# Patient Record
Sex: Female | Born: 1975 | Race: Black or African American | Hispanic: No | Marital: Single | Smoking: Current every day smoker
Health system: Southern US, Community
[De-identification: ages and names within clinical notes are randomized; demographics above are authoritative.]

## PROBLEM LIST (undated history)

## (undated) DIAGNOSIS — J449 Chronic obstructive pulmonary disease, unspecified: Secondary | ICD-10-CM

## (undated) DIAGNOSIS — G4730 Sleep apnea, unspecified: Secondary | ICD-10-CM

## (undated) DIAGNOSIS — Z9889 Other specified postprocedural states: Secondary | ICD-10-CM

## (undated) DIAGNOSIS — M545 Low back pain, unspecified: Secondary | ICD-10-CM

## (undated) DIAGNOSIS — R29898 Other symptoms and signs involving the musculoskeletal system: Secondary | ICD-10-CM

## (undated) DIAGNOSIS — J439 Emphysema, unspecified: Secondary | ICD-10-CM

## (undated) DIAGNOSIS — R112 Nausea with vomiting, unspecified: Secondary | ICD-10-CM

## (undated) DIAGNOSIS — M51369 Other intervertebral disc degeneration, lumbar region without mention of lumbar back pain or lower extremity pain: Secondary | ICD-10-CM

## (undated) DIAGNOSIS — F32A Depression, unspecified: Secondary | ICD-10-CM

## (undated) DIAGNOSIS — J189 Pneumonia, unspecified organism: Secondary | ICD-10-CM

## (undated) DIAGNOSIS — D509 Iron deficiency anemia, unspecified: Principal | ICD-10-CM

## (undated) DIAGNOSIS — I10 Essential (primary) hypertension: Secondary | ICD-10-CM

## (undated) DIAGNOSIS — A64 Unspecified sexually transmitted disease: Secondary | ICD-10-CM

## (undated) DIAGNOSIS — F419 Anxiety disorder, unspecified: Secondary | ICD-10-CM

## (undated) DIAGNOSIS — M5136 Other intervertebral disc degeneration, lumbar region: Secondary | ICD-10-CM

## (undated) HISTORY — DX: Low back pain, unspecified: M54.50

## (undated) HISTORY — DX: Depression, unspecified: F32.A

## (undated) HISTORY — DX: Iron deficiency anemia, unspecified: D50.9

## (undated) HISTORY — PX: TUBAL LIGATION: SHX77

## (undated) HISTORY — DX: Unspecified sexually transmitted disease: A64

## (undated) HISTORY — DX: Sleep apnea, unspecified: G47.30

## (undated) HISTORY — PX: OTHER SURGICAL HISTORY: SHX169

## (undated) HISTORY — DX: Anxiety disorder, unspecified: F41.9

## (undated) HISTORY — DX: Other symptoms and signs involving the musculoskeletal system: R29.898

## (undated) HISTORY — DX: Other intervertebral disc degeneration, lumbar region without mention of lumbar back pain or lower extremity pain: M51.369

## (undated) HISTORY — DX: Other intervertebral disc degeneration, lumbar region: M51.36

---

## 1997-04-26 ENCOUNTER — Emergency Department (HOSPITAL_COMMUNITY): Admission: EM | Admit: 1997-04-26 | Discharge: 1997-04-26 | Payer: Self-pay | Admitting: Emergency Medicine

## 1997-05-21 ENCOUNTER — Emergency Department (HOSPITAL_COMMUNITY): Admission: EM | Admit: 1997-05-21 | Discharge: 1997-05-21 | Payer: Self-pay | Admitting: Emergency Medicine

## 1997-06-10 ENCOUNTER — Emergency Department (HOSPITAL_COMMUNITY): Admission: EM | Admit: 1997-06-10 | Discharge: 1997-06-10 | Payer: Self-pay | Admitting: Emergency Medicine

## 1997-06-15 ENCOUNTER — Inpatient Hospital Stay (HOSPITAL_COMMUNITY): Admission: AD | Admit: 1997-06-15 | Discharge: 1997-06-15 | Payer: Self-pay | Admitting: *Deleted

## 1997-06-28 ENCOUNTER — Inpatient Hospital Stay (HOSPITAL_COMMUNITY): Admission: AD | Admit: 1997-06-28 | Discharge: 1997-06-28 | Payer: Self-pay | Admitting: Obstetrics

## 1997-07-05 ENCOUNTER — Emergency Department (HOSPITAL_COMMUNITY): Admission: EM | Admit: 1997-07-05 | Discharge: 1997-07-05 | Payer: Self-pay | Admitting: Emergency Medicine

## 1997-07-13 ENCOUNTER — Inpatient Hospital Stay (HOSPITAL_COMMUNITY): Admission: AD | Admit: 1997-07-13 | Discharge: 1997-07-13 | Payer: Self-pay | Admitting: Obstetrics

## 1997-07-29 ENCOUNTER — Inpatient Hospital Stay (HOSPITAL_COMMUNITY): Admission: AD | Admit: 1997-07-29 | Discharge: 1997-07-29 | Payer: Self-pay | Admitting: Obstetrics

## 1997-08-01 ENCOUNTER — Encounter: Admission: RE | Admit: 1997-08-01 | Discharge: 1997-08-01 | Payer: Self-pay | Admitting: Obstetrics

## 1997-08-14 ENCOUNTER — Emergency Department (HOSPITAL_COMMUNITY): Admission: EM | Admit: 1997-08-14 | Discharge: 1997-08-14 | Payer: Self-pay | Admitting: Emergency Medicine

## 1998-02-01 ENCOUNTER — Emergency Department (HOSPITAL_COMMUNITY): Admission: EM | Admit: 1998-02-01 | Discharge: 1998-02-01 | Payer: Self-pay | Admitting: Emergency Medicine

## 1998-05-06 ENCOUNTER — Encounter: Payer: Self-pay | Admitting: Emergency Medicine

## 1998-05-06 ENCOUNTER — Emergency Department (HOSPITAL_COMMUNITY): Admission: EM | Admit: 1998-05-06 | Discharge: 1998-05-06 | Payer: Self-pay | Admitting: Emergency Medicine

## 1998-05-08 ENCOUNTER — Emergency Department (HOSPITAL_COMMUNITY): Admission: EM | Admit: 1998-05-08 | Discharge: 1998-05-08 | Payer: Self-pay | Admitting: Emergency Medicine

## 1998-05-08 ENCOUNTER — Encounter: Payer: Self-pay | Admitting: Emergency Medicine

## 1998-07-17 ENCOUNTER — Encounter: Payer: Self-pay | Admitting: *Deleted

## 1998-07-17 ENCOUNTER — Inpatient Hospital Stay (HOSPITAL_COMMUNITY): Admission: EM | Admit: 1998-07-17 | Discharge: 1998-07-23 | Payer: Self-pay | Admitting: Emergency Medicine

## 1998-07-19 ENCOUNTER — Encounter: Payer: Self-pay | Admitting: *Deleted

## 1998-08-26 ENCOUNTER — Encounter: Payer: Self-pay | Admitting: *Deleted

## 1998-08-26 ENCOUNTER — Emergency Department (HOSPITAL_COMMUNITY): Admission: EM | Admit: 1998-08-26 | Discharge: 1998-08-26 | Payer: Self-pay | Admitting: Emergency Medicine

## 1999-02-27 ENCOUNTER — Inpatient Hospital Stay (HOSPITAL_COMMUNITY): Admission: AD | Admit: 1999-02-27 | Discharge: 1999-02-27 | Payer: Self-pay | Admitting: Obstetrics

## 1999-03-05 ENCOUNTER — Inpatient Hospital Stay (HOSPITAL_COMMUNITY): Admission: AD | Admit: 1999-03-05 | Discharge: 1999-03-05 | Payer: Self-pay | Admitting: Obstetrics & Gynecology

## 2000-03-14 ENCOUNTER — Emergency Department (HOSPITAL_COMMUNITY): Admission: EM | Admit: 2000-03-14 | Discharge: 2000-03-14 | Payer: Self-pay | Admitting: Emergency Medicine

## 2000-12-18 ENCOUNTER — Encounter: Payer: Self-pay | Admitting: Emergency Medicine

## 2000-12-19 ENCOUNTER — Inpatient Hospital Stay (HOSPITAL_COMMUNITY): Admission: EM | Admit: 2000-12-19 | Discharge: 2000-12-28 | Payer: Self-pay | Admitting: Emergency Medicine

## 2000-12-28 ENCOUNTER — Encounter: Payer: Self-pay | Admitting: Emergency Medicine

## 2000-12-29 ENCOUNTER — Inpatient Hospital Stay (HOSPITAL_COMMUNITY): Admission: EM | Admit: 2000-12-29 | Discharge: 2000-12-31 | Payer: Self-pay | Admitting: Emergency Medicine

## 2000-12-30 ENCOUNTER — Encounter: Payer: Self-pay | Admitting: Internal Medicine

## 2001-01-18 ENCOUNTER — Inpatient Hospital Stay (HOSPITAL_COMMUNITY): Admission: EM | Admit: 2001-01-18 | Discharge: 2001-01-24 | Payer: Self-pay | Admitting: Emergency Medicine

## 2001-01-18 ENCOUNTER — Encounter: Payer: Self-pay | Admitting: Emergency Medicine

## 2001-07-11 ENCOUNTER — Encounter: Payer: Self-pay | Admitting: Emergency Medicine

## 2001-07-11 ENCOUNTER — Inpatient Hospital Stay (HOSPITAL_COMMUNITY): Admission: EM | Admit: 2001-07-11 | Discharge: 2001-07-20 | Payer: Self-pay | Admitting: Emergency Medicine

## 2001-07-12 ENCOUNTER — Encounter: Payer: Self-pay | Admitting: Internal Medicine

## 2001-07-14 ENCOUNTER — Encounter: Payer: Self-pay | Admitting: Internal Medicine

## 2001-07-16 ENCOUNTER — Encounter: Payer: Self-pay | Admitting: Internal Medicine

## 2001-07-17 ENCOUNTER — Encounter: Payer: Self-pay | Admitting: Internal Medicine

## 2001-07-21 ENCOUNTER — Emergency Department (HOSPITAL_COMMUNITY): Admission: EM | Admit: 2001-07-21 | Discharge: 2001-07-21 | Payer: Self-pay | Admitting: Emergency Medicine

## 2001-07-21 ENCOUNTER — Encounter: Payer: Self-pay | Admitting: Emergency Medicine

## 2001-11-08 ENCOUNTER — Emergency Department (HOSPITAL_COMMUNITY): Admission: EM | Admit: 2001-11-08 | Discharge: 2001-11-08 | Payer: Self-pay | Admitting: Emergency Medicine

## 2001-12-14 ENCOUNTER — Encounter: Payer: Self-pay | Admitting: Emergency Medicine

## 2001-12-14 ENCOUNTER — Emergency Department (HOSPITAL_COMMUNITY): Admission: EM | Admit: 2001-12-14 | Discharge: 2001-12-14 | Payer: Self-pay | Admitting: Emergency Medicine

## 2002-04-02 ENCOUNTER — Inpatient Hospital Stay (HOSPITAL_COMMUNITY): Admission: EM | Admit: 2002-04-02 | Discharge: 2002-04-05 | Payer: Self-pay | Admitting: Emergency Medicine

## 2002-04-02 ENCOUNTER — Encounter: Payer: Self-pay | Admitting: Emergency Medicine

## 2002-08-20 ENCOUNTER — Emergency Department (HOSPITAL_COMMUNITY): Admission: EM | Admit: 2002-08-20 | Discharge: 2002-08-20 | Payer: Self-pay | Admitting: *Deleted

## 2002-08-20 ENCOUNTER — Encounter: Payer: Self-pay | Admitting: Emergency Medicine

## 2002-09-12 ENCOUNTER — Emergency Department (HOSPITAL_COMMUNITY): Admission: EM | Admit: 2002-09-12 | Discharge: 2002-09-12 | Payer: Self-pay | Admitting: Emergency Medicine

## 2002-10-20 ENCOUNTER — Emergency Department (HOSPITAL_COMMUNITY): Admission: EM | Admit: 2002-10-20 | Discharge: 2002-10-20 | Payer: Self-pay | Admitting: Emergency Medicine

## 2002-10-31 ENCOUNTER — Emergency Department (HOSPITAL_COMMUNITY): Admission: EM | Admit: 2002-10-31 | Discharge: 2002-10-31 | Payer: Self-pay | Admitting: Emergency Medicine

## 2002-12-03 ENCOUNTER — Emergency Department (HOSPITAL_COMMUNITY): Admission: EM | Admit: 2002-12-03 | Discharge: 2002-12-03 | Payer: Self-pay | Admitting: Emergency Medicine

## 2003-01-29 ENCOUNTER — Emergency Department (HOSPITAL_COMMUNITY): Admission: EM | Admit: 2003-01-29 | Discharge: 2003-01-29 | Payer: Self-pay | Admitting: Emergency Medicine

## 2003-07-30 ENCOUNTER — Emergency Department (HOSPITAL_COMMUNITY): Admission: EM | Admit: 2003-07-30 | Discharge: 2003-07-30 | Payer: Self-pay | Admitting: Emergency Medicine

## 2003-08-01 ENCOUNTER — Emergency Department (HOSPITAL_COMMUNITY): Admission: EM | Admit: 2003-08-01 | Discharge: 2003-08-01 | Payer: Self-pay | Admitting: Emergency Medicine

## 2004-01-07 ENCOUNTER — Inpatient Hospital Stay (HOSPITAL_COMMUNITY): Admission: AD | Admit: 2004-01-07 | Discharge: 2004-01-07 | Payer: Self-pay | Admitting: Obstetrics & Gynecology

## 2004-02-14 ENCOUNTER — Inpatient Hospital Stay (HOSPITAL_COMMUNITY): Admission: AD | Admit: 2004-02-14 | Discharge: 2004-02-14 | Payer: Self-pay | Admitting: Family Medicine

## 2004-02-26 ENCOUNTER — Inpatient Hospital Stay (HOSPITAL_COMMUNITY): Admission: AD | Admit: 2004-02-26 | Discharge: 2004-02-26 | Payer: Self-pay | Admitting: Obstetrics

## 2004-03-29 ENCOUNTER — Observation Stay (HOSPITAL_COMMUNITY): Admission: AD | Admit: 2004-03-29 | Discharge: 2004-03-29 | Payer: Self-pay | Admitting: Obstetrics & Gynecology

## 2004-05-19 ENCOUNTER — Inpatient Hospital Stay (HOSPITAL_COMMUNITY): Admission: AD | Admit: 2004-05-19 | Discharge: 2004-05-22 | Payer: Self-pay | Admitting: *Deleted

## 2004-07-08 ENCOUNTER — Observation Stay (HOSPITAL_COMMUNITY): Admission: EM | Admit: 2004-07-08 | Discharge: 2004-07-09 | Payer: Self-pay | Admitting: Obstetrics & Gynecology

## 2004-07-08 ENCOUNTER — Encounter: Payer: Self-pay | Admitting: Emergency Medicine

## 2004-07-24 ENCOUNTER — Inpatient Hospital Stay (HOSPITAL_COMMUNITY): Admission: AD | Admit: 2004-07-24 | Discharge: 2004-07-24 | Payer: Self-pay | Admitting: Obstetrics & Gynecology

## 2004-07-25 ENCOUNTER — Inpatient Hospital Stay (HOSPITAL_COMMUNITY): Admission: AD | Admit: 2004-07-25 | Discharge: 2004-07-28 | Payer: Self-pay | Admitting: Obstetrics & Gynecology

## 2004-08-12 ENCOUNTER — Inpatient Hospital Stay (HOSPITAL_COMMUNITY): Admission: AD | Admit: 2004-08-12 | Discharge: 2004-08-15 | Payer: Self-pay | Admitting: Obstetrics & Gynecology

## 2004-08-12 ENCOUNTER — Encounter (INDEPENDENT_AMBULATORY_CARE_PROVIDER_SITE_OTHER): Payer: Self-pay | Admitting: Specialist

## 2005-03-24 ENCOUNTER — Emergency Department (HOSPITAL_COMMUNITY): Admission: EM | Admit: 2005-03-24 | Discharge: 2005-03-24 | Payer: Self-pay | Admitting: Emergency Medicine

## 2005-09-25 ENCOUNTER — Emergency Department (HOSPITAL_COMMUNITY): Admission: EM | Admit: 2005-09-25 | Discharge: 2005-09-25 | Payer: Self-pay | Admitting: Emergency Medicine

## 2006-06-11 ENCOUNTER — Emergency Department (HOSPITAL_COMMUNITY): Admission: EM | Admit: 2006-06-11 | Discharge: 2006-06-11 | Payer: Self-pay | Admitting: Emergency Medicine

## 2006-06-30 ENCOUNTER — Inpatient Hospital Stay (HOSPITAL_COMMUNITY): Admission: AD | Admit: 2006-06-30 | Discharge: 2006-06-30 | Payer: Self-pay | Admitting: Obstetrics and Gynecology

## 2006-07-03 ENCOUNTER — Inpatient Hospital Stay (HOSPITAL_COMMUNITY): Admission: AD | Admit: 2006-07-03 | Discharge: 2006-07-03 | Payer: Self-pay | Admitting: Gynecology

## 2006-09-11 ENCOUNTER — Encounter: Admission: RE | Admit: 2006-09-11 | Discharge: 2006-09-11 | Payer: Self-pay | Admitting: Orthopedic Surgery

## 2008-12-22 ENCOUNTER — Inpatient Hospital Stay (HOSPITAL_COMMUNITY): Admission: AD | Admit: 2008-12-22 | Discharge: 2008-12-22 | Payer: Self-pay | Admitting: Obstetrics

## 2008-12-25 ENCOUNTER — Emergency Department (HOSPITAL_COMMUNITY): Admission: EM | Admit: 2008-12-25 | Discharge: 2008-12-25 | Payer: Self-pay | Admitting: Emergency Medicine

## 2009-02-15 ENCOUNTER — Emergency Department (HOSPITAL_COMMUNITY): Admission: EM | Admit: 2009-02-15 | Discharge: 2009-02-15 | Payer: Self-pay | Admitting: Emergency Medicine

## 2009-09-24 ENCOUNTER — Ambulatory Visit (HOSPITAL_COMMUNITY): Admission: RE | Admit: 2009-09-24 | Discharge: 2009-09-24 | Payer: Self-pay | Admitting: Pulmonary Disease

## 2010-01-24 ENCOUNTER — Encounter: Payer: Self-pay | Admitting: Obstetrics & Gynecology

## 2010-01-25 ENCOUNTER — Encounter: Payer: Self-pay | Admitting: Orthopedic Surgery

## 2010-01-25 ENCOUNTER — Encounter: Payer: Self-pay | Admitting: Obstetrics & Gynecology

## 2010-01-25 ENCOUNTER — Encounter: Payer: Self-pay | Admitting: Obstetrics

## 2010-01-25 ENCOUNTER — Encounter: Payer: Self-pay | Admitting: Pulmonary Disease

## 2010-01-26 ENCOUNTER — Encounter: Payer: Self-pay | Admitting: Orthopedic Surgery

## 2010-03-25 LAB — SALICYLATE LEVEL: Salicylate Lvl: <4 mg/dL (ref 2.8–20.0)

## 2010-03-25 LAB — CBC
HCT: 34.2 % — ABNORMAL LOW (ref 36.0–46.0)
MCV: 77.4 fL — ABNORMAL LOW (ref 78.0–100.0)
RBC: 4.42 MIL/uL (ref 3.87–5.11)
WBC: 13.3 10*3/uL — ABNORMAL HIGH (ref 4.0–10.5)

## 2010-03-25 LAB — DIFFERENTIAL
Eosinophils Absolute: 0 10*3/uL (ref 0.0–0.7)
Lymphocytes Relative: 10 % — ABNORMAL LOW (ref 12–46)
Lymphs Abs: 1.4 10*3/uL (ref 0.7–4.0)
Monocytes Relative: 6 % (ref 3–12)
Neutrophils Relative %: 84 % — ABNORMAL HIGH (ref 43–77)

## 2010-03-25 LAB — ACETAMINOPHEN LEVEL

## 2010-03-25 LAB — BASIC METABOLIC PANEL
BUN: 11 mg/dL (ref 6–23)
CO2: 21 mEq/L (ref 19–32)
GFR calc non Af Amer: 56 mL/min — ABNORMAL LOW (ref >60)
Glucose, Bld: 162 mg/dL — ABNORMAL HIGH (ref 70–99)
Potassium: 3.8 mEq/L (ref 3.5–5.1)
Sodium: 134 mEq/L — ABNORMAL LOW (ref 135–145)

## 2010-04-06 LAB — CBC
HCT: 35.4 % — ABNORMAL LOW (ref 36.0–46.0)
Hemoglobin: 11.3 g/dL — ABNORMAL LOW (ref 12.0–15.0)
MCHC: 31.8 g/dL (ref 30.0–36.0)
MCHC: 31.5 g/dL (ref 30.0–36.0)
MCV: 79.5 fL (ref 78.0–100.0)
MCV: 79.8 fL (ref 78.0–100.0)
RBC: 4.46 MIL/uL (ref 3.87–5.11)
RBC: 4.39 MIL/uL (ref 3.87–5.11)
RDW: 19.1 % — ABNORMAL HIGH (ref 11.5–15.5)
WBC: 11.9 10*3/uL — ABNORMAL HIGH (ref 4.0–10.5)

## 2010-04-06 LAB — DIFFERENTIAL
Basophils Relative: 0 % (ref 0–1)
Eosinophils Absolute: 0.4 10*3/uL (ref 0.0–0.7)
Eosinophils Relative: 3 % (ref 0–5)
Lymphocytes Relative: 8 % — ABNORMAL LOW (ref 12–46)
Lymphs Abs: 2.1 10*3/uL (ref 0.7–4.0)
Lymphs Abs: 1.1 10*3/uL (ref 0.7–4.0)
Monocytes Absolute: 0.7 10*3/uL (ref 0.1–1.0)
Monocytes Relative: 6 % (ref 3–12)
Monocytes Relative: 2 % — ABNORMAL LOW (ref 3–12)
Neutrophils Relative %: 73 % (ref 43–77)
Neutrophils Relative %: 89 % — ABNORMAL HIGH (ref 43–77)

## 2010-04-06 LAB — URINALYSIS, ROUTINE W REFLEX MICROSCOPIC
Bilirubin Urine: NEGATIVE
Bilirubin Urine: NEGATIVE
Ketones, ur: 15 mg/dL — AB
Nitrite: NEGATIVE
Nitrite: NEGATIVE
Protein, ur: NEGATIVE mg/dL
Specific Gravity, Urine: 1.012 (ref 1.005–1.030)
Urobilinogen, UA: 0.2 mg/dL (ref 0.0–1.0)
Urobilinogen, UA: 0.2 mg/dL (ref 0.0–1.0)
pH: 6 (ref 5.0–8.0)

## 2010-04-06 LAB — COMPREHENSIVE METABOLIC PANEL
ALT: 15 U/L (ref 0–35)
AST: 22 U/L (ref 0–37)
Albumin: 3.6 g/dL (ref 3.5–5.2)
Alkaline Phosphatase: 37 U/L — ABNORMAL LOW (ref 39–117)
CO2: 25 mEq/L (ref 19–32)
Calcium: 9.7 mg/dL (ref 8.4–10.5)
Chloride: 105 mEq/L (ref 96–112)
Creatinine, Ser: 0.86 mg/dL (ref 0.4–1.2)
GFR calc Af Amer: >60 mL/min (ref >60)
GFR calc non Af Amer: >60 mL/min (ref >60)
Glucose, Bld: 105 mg/dL — ABNORMAL HIGH (ref 70–99)
Glucose, Bld: 165 mg/dL — ABNORMAL HIGH (ref 70–99)
Potassium: 3.9 mEq/L (ref 3.5–5.1)
Sodium: 136 mEq/L (ref 135–145)
Sodium: 136 mEq/L (ref 135–145)
Total Bilirubin: 0.5 mg/dL (ref 0.3–1.2)
Total Protein: 6.9 g/dL (ref 6.0–8.3)
Total Protein: 7.6 g/dL (ref 6.0–8.3)

## 2010-04-06 LAB — GC/CHLAMYDIA PROBE AMP, GENITAL

## 2010-04-06 LAB — RAPID URINE DRUG SCREEN, HOSP PERFORMED
Amphetamines: NOT DETECTED
Benzodiazepines: POSITIVE — AB
Cocaine: POSITIVE — AB
Opiates: NOT DETECTED
Tetrahydrocannabinol: POSITIVE — AB

## 2010-04-06 LAB — HCG, SERUM, QUALITATIVE: Preg, Serum: NEGATIVE

## 2010-04-06 LAB — TYPE AND SCREEN
ABO/RH(D): O POS
Antibody Screen: NEGATIVE

## 2010-04-06 LAB — ABO/RH: ABO/RH(D): O POS

## 2010-04-06 LAB — WET PREP, GENITAL

## 2010-05-22 NOTE — Discharge Summary (Signed)
Andrea Montgomery, Andrea Montgomery               ACCOUNT NO.:  [id number]   MEDICAL RECORD NO.:  [id number]          PATIENT TYPE:  INP   LOCATION:  9104                          FACILITY:  [facility]   PHYSICIAN:  Roseanna Rainbow, M.D.DATE OF BIRTH:  Jun 19, 1975   DATE OF ADMISSION:  08/12/2004  DATE OF DISCHARGE:  08/15/2004                                 DISCHARGE SUMMARY   CHIEF COMPLAINT:  The patient is a 35 year old, gravida 2, para 0-1-0-1, who  presented at 35+ weeks with fetal heart rate decelerations during non-stress  test.   HISTORY OF PRESENT ILLNESS:  Please see the above.  The nadir of the  decelerations was 70 beats per minute for five minutes.  Prenatal course--  care at [facility] with onset of care at 11 weeks.  Pregnancy  complications or risks:  Pregestational diabetes mellitus, hyperemesis  gravidarum, tobacco use.   MEDICATIONS:  Prenatal vitamins.   ALLERGIES:  No known allergies.   OB HISTORY:  In 1998 at 34 weeks, possibly although the dating is  questionable, she was delivered of an infant, 9 pounds 14 ounces by cesarean  delivery.  That pregnancy was complicated by large-for-gestational-age  infant and pregestational diabetes mellitus.   PAST MEDICAL HISTORY:  Asthma.  Please see above.   PAST SURGICAL HISTORY:  See the above.   FAMILY AND SOCIAL HISTORY:  She is a Consulting civil engineer.  Please see the above.   PRENATAL LABS:  Type and RH:  O positive, antibody screen negative.  Hemoglobin 11.7, platelets 401,000.  Hepatitis B surface antigen  nonreactive. Syphilis nonreactive.   PHYSICAL EXAMINATION:  VITAL SIGNS:  Stable. Afebrile.  Random CBG 100.  GENERAL:  No apparent distress.  ABDOMEN:  Gravid.  Fetal heart rate baseline 140's, nonreactive,  variability average.  Please see the above.  Contractions:  None.   ASSESSMENT:  1.  Intrauterine pregnancy at 35+ weeks with pregestational diabetes      mellitus.  2.  Suspicious fetal heart tracing.  3.   History of previous cesarean delivery.   PLAN:  Admission.  Repeat cesarean delivery.   HOSPITAL COURSE:  The patient was admitted and underwent a repeat cesarean  delivery and bilateral tubal ligation.  Please see the dictated operative  summary for further details.  Her postoperative course was uneventful.  She  was discharged to home on postoperative day #3, tolerating an ADA diet.   DISCHARGE DIAGNOSES:  1.  Intrauterine pregnancy at 35+ weeks.  2.  Suspicious fetal heart tracing with prolonged decelerations.  3.  Pregestational diabetes mellitus.   OPERATION/PROCEDURE:  1.  Repeat cesarean delivery.  2.  Bilateral tubal ligation.   CONDITION ON DISCHARGE:  Stable.   DIET:  ADA diet.   DISCHARGE MEDICATIONS:  Tylox, ibuprofen, prenatal vitamins, stool softener.   DISPOSITION:  The patient is to follow up in the office in six weeks.      Roseanna Rainbow, M.D.  Electronically Signed     LAJ/MEDQ  D:  11/18/2004  T:  11/19/2004  Job:  [id number]

## 2010-05-22 NOTE — Discharge Summary (Signed)
[facility]  Patient:    Andrea Montgomery, Andrea Montgomery Visit Number: [id number] MRN: [id number]          Service Type: EMS Location: ED Attending Physician:  Donnetta Hutching Dictated by:   Gracelyn Nurse, M.D. Admit Date:  07/21/2001 Discharge Date: 07/21/2001   CC:         Eino Farber., M.D.   Discharge Summary  DISCHARGE DIAGNOSES: 1. Pneumonia. 2. Asthma exacerbation. 3. Type 2 diabetes. 4. Hypertension. 5. Depression. 6. Tobacco abuse. 7. Morbid obesity. 8. Oral and vaginal candidiasis, likely secondary to antibiotics.  DISCHARGE MEDICATIONS: 1. Tequin 400 mg q.d. x3 more days. 2. Diflucan 10 mg q.d. x3 days. 3. Albuterol MDI 2 puffs b.i.d. 4. Advair 100/50 1 puff b.i.d. 5. Glucotrol 10 mg b.i.d. 6. Glucophage 500 mg t.i.d.  ADMISSION HISTORY AND PHYSICAL: This is a 35 year old, morbidly obese black female with a two-week history of progressive shortness of breath, wheezing and cough productive of yellow sputum. She has been getting less and less relief from her inhalers.  HOSPITAL COURSE: #1 - PNEUMONIA: Chest x-ray showed a left lower lobe infiltrate. The patient was started on IV Tequin, responded to treatment and was changed over to p.o. and she is to finish off a 10-day course of Tequin as an outpatient.  #2 - ASTHMA EXACERBATION: The patient was wheezing on admission. This is likely secondary to the pneumonia aggravating her asthma. She was treated with p.o. steroids and nebulizers, responded appropriately. Steroids were tapered off pretty quickly and she will not be taking any as an outpatient.  #3 - TYPE 2 DIABETES: This has been poorly controlled as an outpatient due to questionable compliance. She was maintained on sliding-scale insulin and her home medications. Sliding-scale is being used because she was on steroids. Those were tapered down. Blood sugar control got better and she is not going to be discharged on any  steroids so her diabetes control does need to be reassessed as an outpatient.  DISPOSITION: The patient is to be discharged in stable condition. She is to contact Dr. Junius Roads office next week for a followup appointment. Dictated by:   Gracelyn Nurse, M.D. Attending Physician:  Donnetta Hutching DD:  07/20/01 TD:  07/26/01 Job: [id number]

## 2010-05-22 NOTE — Op Note (Signed)
Andrea Montgomery, Andrea Montgomery               ACCOUNT NO.:  [id number]   MEDICAL RECORD NO.:  [id number]          PATIENT TYPE:  INP   LOCATION:  9104                          [facility]ACILITY:  [facility]   PHYSICIAN:  Roseanna Rainbow, M.D.DATE OF BIRTH:  12-Nov-1975   DATE OF PROCEDURE:  08/12/2004  DATE OF DISCHARGE:                                 OPERATIVE REPORT   PREOPERATIVE DIAGNOSES:  1.  Intrauterine pregnancy at 35 plus weeks.  2.  Nonstress test suspicious with a prolonged severe deceleration for five      minutes.  3.  Pregestational diabetes mellitus.  4.  History of a previous cesarean delivery.  5.  Desires a sterilization procedure.   POSTOPERATIVE DIAGNOSES:  1.  Intrauterine pregnancy at 35 plus weeks.  2.  Nonstress test suspicious with a prolonged severe deceleration for five      minutes.  3.  Pregestational diabetes mellitus.  4.  History of a previous cesarean delivery.  5.  Desires a sterilization procedure.  6.  Adnexal adhesions.   PROCEDURE:  Repeat cesarean delivery, modified Pomeroy, bilateral tubal  ligation, lysis of adhesions via Pfannenstiel skin incision.   SURGEON:  Dr. Tamela Oddi, Dr. Clearance Coots.   ANESTHESIA:  Spinal.   ESTIMATED BLOOD LOSS:  600 cc.   COMPLICATIONS:  None.   IV FLUIDS AND URINE OUTPUT:  As per anesthesiology.   FINDINGS:  Bilaterally there were adhesions involving the ovary and the tube  at the mid isthmic and ampullary portions.. The infant was double footling  breech presentation.   PROCEDURE:  The patient was taken to the operating room with an IV running.  She was placed in the dorsal supine position with a leftward tilt and  prepped and draped in the usual sterile fashion. A Pfannenstiel skin  incision was then made with the scalpel and carried down to the underlying  fascia with the Bovie. The fascia was nicked in the midline. The fascial  incision was then extended bilaterally with curved Mayo scissors. The  superior aspect  of the fascial incision was then tented up and the  underlying rectus muscles dissected off. The inferior aspect of the fascial  incision was manipulated in a similar fashion. The rectus muscles were  separated in the midline. The parietoperitoneum was tented up and entered  sharply with Metzenbaum scissors. This incision was then extended superiorly  and inferiorly with good visualization of the bladder. The bladder blade was  then placed. The vesicouterine peritoneum was tented up and entered sharply  with Metzenbaum scissors. This incision was then extended bilaterally and  the bladder flap created sharply. The bladder blade was then replaced. The  lower uterine segment was incised in a transverse fashion with the scalpel.  This incision was then extended bluntly. A complete breech extraction was  then performed. The infant's head was delivered atraumatically. The  oropharynx was suctioned with the bulb suction. The cord was clamped and  cut. The infant was handed off to the waiting neonatologist. Cord gas was  sent. The Apgars were 8 and 9 at one and five minutes, respectively. The  placenta was then removed.  The intrauterine cavity was evacuated of any  remaining amniotic fluid, clots and debris with a moistened laparotomy  sponge. The uterus was then exteriorized. The uterine incision was  reapproximated in a running interlocking fashion with 0 Monocryl. Adequate  hemostasis was noted. Attention was then turned to the left fallopian tube  which was grasped with the Babcock clamp. The adhesions noted above were  then sharply divided. A 2-cm segment of the mid isthmic portion of the tube  was then doubly ligated with 0 plain and excised. Adequate hemostasis was  noted. The right fallopian tube was manipulated in a similar fashion. The  uterus was returned to the abdomen. The paracolic gutters were copiously  irrigated. The parietal peritoneum was reapproximated in a running fashion   with 2-0 Vicryl. The fascia was reapproximated with 0 PDS in a running  fashion. The skin was reapproximated with staples. A 1 gram of cephazolin  was given at cord clamp. At the close of the procedure, the instrument and  pack count was said to be correct x2. The patient was taken to the PACU  awake and in stable condition.      Roseanna Rainbow, M.D.  Electronically Signed     LAJ/MEDQ  D:  08/13/2004  T:  08/13/2004  Job:  [id number]

## 2010-05-22 NOTE — Discharge Summary (Signed)
[facility]  Patient:    Andrea Montgomery, RAIMONDI Visit Number: [id number] MRN: [id number]          Service Type: MED Location: [phone] Attending Physician:  Jackie Plum Dictated by:   Jackie Plum, M.D. Admit Date:  12/28/2000 Disc. Date: 12/28/00   CC:         Lorelle Formosa, M.D.   Discharge Summary  DISCHARGE DIAGNOSES: 1. Status asthmaticus - resolved, moderate persistent asthma.    a. Best peak respiratory flow rate unknown.    b. Peak respiratory flow rate on discharge 350. 2. Abnormal chest x-ray, probable pneumonia.  Chest CT without contrast done    on December 26, 2000, notable for bibasilar atelectasis and infiltrate    involving the lower lobes, left greater than the right, with very small    right effusion.  Chest x-ray done on December 23, 2000, notable for    bibasilar atelectasis and/or infiltrates. 3. Leukocytosis - probably multifactorial related to steroid-induced    leukocytosis with possible contribution by problem #2 (probable pneumonia)    and stress induced. 4. Diabetes mellitus. 5. Hypertension. 6. Anxiety. 7. Tobacco smoking. 8. Obesity.  DISCHARGE MEDICATIONS: 1. Albuterol MDI two puffs p.r.n. q.i.d. 2. Glucotrol 10 mg p.o. b.i.d. 3. Prednisone 10 mg tablets to be taken as four tablets p.o. q.d. for three    days, then three tablets p.o. q.d. for three days, then two tablets p.o.    q.d. for three days, then one tablet p.o. q.d. for three days, and then    stop.  The following medications are new: 1. Advair Diskus one puff b.i.d. 2. ______ 200 mg p.o. t.i.d. 3. Guaifenesin 1200 mg p.o. b.i.d. 4. Glucophage 500 mg p.o. b.i.d.  DISCHARGE LABORATORY WORK:   WBC count 83.4, hemoglobin 15.2, hematocrit 47.3, MCV 82.3, platelet count 551.  Sodium 135, potassium 3.6, chloride 98, bicarbonate 29, glucose 92, BUN 18, creatinine 0.7, calcium 9.3.  CONDITION ON DISCHARGE:  Improved.  CONSULTANTS:  Not  applicable.  PROCEDURES:  Not applicable.  ACTIVITIES:  As tolerated.  DIET:  Low-salt, low-fat diet.  SPECIAL INSTRUCTIONS:  Patient has been instructed to take medications as instructed, to report to physician if she experiences any worsening or increasing shortness of breath, or wheezing.  Follow-up on her will be with her primary care doctor, Dr. Ronne Binning, in one or two weeks.  This patient will call for appointment.  I have left a message at Dr. Macario Carls answering service for an earl appointment.  HISTORY OF PRESENT ILLNESS:  A 35 year old, Ms. Burack, presented for admission on December 18, 2000, with a seven to 10-day history of shortness of breath with productive cough which was getting so bad that she had to increase the use of her nebulizers to almost daily without any improvement.   Physical exam on admission was notable for poor air movement with posterior respiratory wheeze.  She was admitted to the medical floor for asthma exacerbation and was started on the usual medications including IV fluids, IV steroids, and bronchodilators.  HOSPITAL COURSE: #1 - ASTHMA EXACERBATION:  Patient received above medications and was monitored carefully.  Initially, she was very slow to improve and, when I saw her two days ago, I added antibiotic to her regimen because of physical exam which was notable for bibasilar crackles with infiltrates on imaging studies. The patients hypoxia improved remarkably on the day of discharge.  Oxygen saturation was 94% on room air.  She was able to  ambulate on the medical floor without any overt shortness of breath.  I believe that this patient may have moderate persistent asthma.  She has been started on Advair and will continue her steroids p.o. to be tapered off over the next 12 days.  #2 - PROBABLE PNEUMONIA:  During patients hospital stay, she was found to have an infiltrate on x-ray.  Chest CT was done subsequently and during her hospital stay  because of her slow improvement with respect to her asthma exacerbation.  This was done without contrast because patient has refused to take contrast which revealed infiltrates and atelectasis.  She received incentive spirometry and antibiotics were added.  We advised that the patients x-ray be followed up to clear as an outpatient.  #3 - LEUKOCYTOSIS:  During the patients hospital stay, she was found to have a very high WBC count.  We believe that this may be multifactorial including steroid induced, stress, margination, and possible chest infection.  We advised that her WBC count be followed up during her visits with her primary care doctor as an outpatient.  #4 - DIABETES MELLITUS:  Patients CBGs were initially out of control. Glucophage was added to her medication regimen with improvement of her CBGs. At the time of discharge, patients blood glucose levels were around 110 to 120 mg per dl.  #5 - HYPERTENSION:  This was controlled during hospitalization.  #6 - ANXIETY:  Patient during hospitalization was found to have episodes of tremulousness and anxiousness.  Xanax was added initially with good control and, at the time of discharge, did not show any anxious mood.  #7 - TOBACCO SMOKING:  Patient has a history of bronchial asthma.  She received extensive counseling regarding the effect of cigarette smoking on her asthma control.  We were advised that patients continue to get emphasis on these issues during her outpatient visits.  She should be offered cigarette cessation program enrollment. Dictated by:   Jackie Plum, M.D. Attending Physician:  Jackie Plum DD:  12/28/00 TD:  12/29/00 Job: [id number]

## 2010-05-22 NOTE — Discharge Summary (Signed)
Andrea Montgomery, MANUELITO               ACCOUNT NO.:  [id number]   MEDICAL RECORD NO.:  [id number]          PATIENT TYPE:  INP   LOCATION:  9156                          [facility]ACILITY[facility]  [facility]   PHYSICIAN:  Roseanna Rainbow, M.D.DATE OF BIRTH:  1975-08-12   DATE OF ADMISSION:  07/25/2004  DATE OF DISCHARGE:  07/28/2004                                 DISCHARGE SUMMARY   Ms. Rawling was admitted on July 25, 2004, for complaints of right abdominal  pain radiating to the left lower quadrant.  At that time we were questioning  acute abdomen versus preterm labor.   Dictation ended at this point.      Maylon Cos, C.N.M.      Roseanna Rainbow, M.D.  Electronically Signed    SS/MEDQ  D:  10/23/2004  T:  10/23/2004  Job:  [id number]

## 2010-05-22 NOTE — Op Note (Signed)
[name], Andrea Montgomery               ACCOUNT NO.:  [id number]   MEDICAL RECORD NO.:  [id number]          PATIENT TYPE:  INP   LOCATION:  9104                          [facility]   PHYSICIAN:  Roseanna Rainbow, M.D.DATE OF BIRTH:  1975/06/09   DATE OF PROCEDURE:  08/12/2004  DATE OF DISCHARGE:                                 OPERATIVE REPORT   PREOPERATIVE DIAGNOSIS:  Intrauterine pregnancy at 35+ weeks.   Dictation stopped here.      Roseanna Rainbow, M.D.     Andrea Montgomery  D:  08/13/2004  T:  08/13/2004  Job:  [id number]

## 2010-05-22 NOTE — Discharge Summary (Signed)
[facility]  Patient:    Andrea Montgomery, Andrea Montgomery Visit Number: [id number] MRN: [id number]          Service Type: MED Location: [phone] Attending Physician:  Jackie Plum Dictated by:   Jackie Plum, M.D. Admit Date:  12/28/2000 Disc. Date: 12/31/00   CC:         Andrea Montgomery, M.D.  Lorelle Formosa, M.D.   Discharge Summary  DATE OF BIRTH:  11-Jun-1975  DISCHARGE DIAGNOSES: 1. Anxiety disorder, probable panic attacks. 2. Recent history of status asthmaticus - resolved, moderate ______ . 3. Pneumonia.    a. Chest computed tomography without contrast done on       December 26, 2000, positive for bibasilar atelectasis and infiltrate       involving the lower lobes, greater on the right, a diverse pleural       effusion. 4. Leukocytosis - probably multifactorial related to steroid-induced    leukocytosis with possible contribution by pneumonia and stress. 5. Diabetes mellitus. 6. Hypertension. 7. Cigarette smoking. 8. Obesity.  DISCHARGE MEDICATIONS:  1. Xopenex 0.3 mg q.8h.  2. Glucotrol 10 mg p.o. b.i.d.  3. Glucophage 500 p.o. b.i.d.  4. Humibid L.A. ______  mg p.o. b.i.d.  5. Advair 1 puff b.i.d.  6. Xanax 0.5 mg t.i.d.  7. Prednisone 10 mg tablets 3 tablets p.o. q.d. for 1 day, then 2 tablets     p.o. q.d. for 3 days, then 1 tablet p.o. q.d. for 3 days, and then stop.  8. Zithromax 500 mg p.o. q.d. for 2 more days.  9. Plendil 2.5 mg p.o. t.i.d. 10. Ativan 0.5 mg p.o. q.2h. p.r.n. 11. Darvocet-N 100, 1-2 tablets p.o. q.4h.  DISCHARGE LABORATORY:  WBC 25.9, hemoglobin 12.9, hematocrit 39.1, MCV 82.8, platelet count 449.  CONDITION ON DISCHARGE:  Stable.  DISPOSITION:  The patient is being transferred for further management of anxiety/panic attacks by Dr. Jeanie Sewer at the [facility].  CONSULTANTS:  Dr. Jeanie Sewer of psychiatry.  ______  applicable.  ACTIVITY:  As tolerated.  DIET:  Low salt, low  fat diet, 1800 calorie ADA.  SPECIAL INSTRUCTIONS:  The patient is going to be followed by the psychiatric service at [facility].  She will continue her medications and be tapered off the prednisone as soon as possible.  I seriously believe that there might be an element of steroid-induced psychosis, anxiety, and panic attacks.  The patient should be given an early appointment to follow up with her primary care physician, Dr. Ronne Binning, for evaluation of her asthma on discharge from the [facility].  HISTORY OF PRESENT ILLNESS:  Andrea Montgomery was admitted for asthma exacerbation on December 18, 2000 and discharged from hospital on December 28, 2000, but she returned back the next day on account of shortness of breath.  At time of admission, her saturations were more than 92% on room air, and she was admitted and started on ______ treatment of oxygen.  During hospitalization, the patient was found to have episodes of shortness of breath with good oxygen saturations running to ______ 96%.  Without any ______ changes on her lung exams.  Based on this, we thought that the patient might be having panic attacks with anxiety disorder.  She was started on Xanax with some improvement.  Dr. Jeanie Sewer of psychiatry saw the patient and felt that patient should be transferred to the [facility] for further management of her panic attacks.  The patient had  leukocytosis on her previous attack, discharged on the 25th, leukocytosis had improved somewhat to 25,000 at the time of transfer.  This needs to be followed as an outpatient.  Please see the detailed discharge summary done by me on 12/28/00 for full explanation of her other medical issues including asthma, pneumonia, diabetes mellitus, hypertension, and cigarette smoking, which remained status quo during this hospitalization. Dictated by:   Jackie Plum, M.D. Attending Physician:  Jackie Plum DD:   12/31/00 TD:  12/31/00 Job: [id number]

## 2010-05-22 NOTE — Discharge Summary (Signed)
Andrea Montgomery, Andrea Montgomery               ACCOUNT NO.:  [id number]   MEDICAL RECORD NO.:  [id number]          PATIENT TYPE:  INP   LOCATION:  9156                          [facility]ACILITY[facility]  [facility]   PHYSICIAN:  Roseanna Rainbow, M.D.DATE OF BIRTH:  November 20, 1975   DATE OF ADMISSION:  07/26/2004  DATE OF DISCHARGE:  07/28/2004                                 DISCHARGE SUMMARY   CHIEF COMPLAINT:  The patient has an IUP at 33 weeks and complains of lower  abdominal pain.   HOSPITAL COURSE:  She was admitted to the  antenatal unit and given  analgesic medications.  Work-up included a CT scan of the abdomen which was  normal.  Laboratory testing was normal.  Her pain gradually resolved.  The  etiology of the pain was felt to be musculoskeletal.  On hospital day 2, the  patient was complaining of lower abdominal cramping; however, no  contractions were seen on the tocodynamometer.  The fetal heart rate tracing  demonstrated a baseline in the 130s and was reactive.  An ultrasound  demonstrated a cervical length of 4.2 cm.  On hospital day 2, IV fluids were  administered.  She was also started on medications for anxiety.  She was  discharged to home on hospital day 3.  Pre-term labor precautions were  reviewed with patient.   DISCHARGE DIAGNOSES:  1.  IUP at 33 weeks  2.  Abdominal pain, questionable etiology   CONDITION:  Stable.   DIET:  Regular.   ACTIVITY:  Ad lib.   MEDICATIONS:  Darvocet-N 100, with instructions to use q.4-6h. as needed for  pain.  Resume home medications.   DISPOSITION:  Follow-up in the office.      Maylon Cos, C.N.M.      Roseanna Rainbow, M.D.  Electronically Signed    SS/MEDQ  D:  10/23/2004  T:  10/23/2004  Job:  [id number]

## 2010-05-22 NOTE — Discharge Summary (Signed)
[name], [name]                         ACCOUNT NO.:  [id number]   MEDICAL RECORD NO.:  [id number]                   PATIENT TYPE:  INP   LOCATION:  0349                                 FACILITY:  [facility]   PHYSICIAN:  Eino Farber., M.D.      DATE OF BIRTH:  [birth year]   DATE OF ADMISSION:  04/02/2002  DATE OF DISCHARGE:  04/05/2002                                 DISCHARGE SUMMARY   DISCHARGE DIAGNOSES:  1. Chronic obstructive pulmonary asthma with status asthmaticus, treated,     improved.  2. Diabetes mellitus, uncontrolled, type 2.  3. Pneumonia, patchy, in the bases.  4. Chest pain, pleuritic, in the anterior lateral wall area.  5. Obesity, __________ .   BRIEF HISTORY:  The patient is a 35 year old African-American female that  was admitted from the [facility] Emergency Department on  April 02, 2002 with complaint of increasing shortness of breath and anterior  chest wall pain.  She also had wheezes with her shortness of breath.  She  was found to have an elevated capillary blood glucose in the 315 mg percent  range.  The patient relates taking her Advair 250/50 inhalations and  albuterol metered dose inhaler two puffs q.4h. p.r.n. chest tightness  without relief.  The patient also has a history of diabetes mellitus but  admittedly she had not been compliant with taking her medications for her  diabetes.  She also complained of acute nausea and vomiting on the day of  admission.   PAST MEDICAL HISTORY:  1. History of diabetes mellitus type 2.  2. History of asthma for at least over two years.   REVIEW OF SYSTEMS:  HEENT, cardiopulmonary, gastrointestinal, neuromuscular  symptoms to be as noted.   ALLERGIES:  The patient had no known drug allergies.   PHYSICAL EXAMINATION:  GENERAL:  The patient was initially observed on the  [facility] to have audible wheezing while lying in bed.  She was oriented to time,  place, and person.  VITAL SIGNS:  Temperature 97.1, pulse 81, respirations 28, blood pressure  103/53.  LUNGS:  Few to moderate sibilant rhonchi bilaterally that decreased with  cough.  HEART:  Regular rhythm with no rub.  ABDOMEN:  Marked obesity with mild epigastric tenderness.  EXTREMITIES:  Good strength and movement bilaterally with no edema.   LABORATORIES:  Chest x-ray revealed patchy infiltrates in the bases.   HOSPITAL COURSE:  The patient's hospital course was one of gradual  improvement.  She was placed on albuterol nebulizers 2.5 mg q.3h. as needed  for wheezing with gradual improvement.  The patient did have a history of  smoking and she was advised to discontinue smoking.  This appeared to be in  a noncompliant state, however.  The patient continued to improve and was  considered satisfactory for discharge on April 05, 2002.   DISCHARGE MEDICATIONS:  1. Advair 250/50 Diskus inhaled q.a.m. and  q.h.s.  2. Continue Zithromax 250 mg tablets one daily to complete five more days.  3. Albuterol metered dose inhaler two puffs q.6h. p.r.n. cough and chest     tightness.  4. Atrovent two puffs q.6h.  5. Glucotrol XL 10 mg tablets one q.a.m. a.c. breakfast.  6. Glucophage XR 500 mg tablets one daily with breakfast and with the 6 p.m.     dinner meal.  7. Novolin 70/30 mix pen 18 units a.c. breakfast if Accu-Chek blood glucose     is above 115 mg percent and to administer subcutaneous.  8. Amitriptyline 25 mg tablets one q.a.m. and two q.h.s. for rest and pain.  9. Tylenol 650 mg tablets one q.4h. p.r.n. pain.   The patient was to continue on a 1500-1800 calorie ADA diet for weight  reduction.  She was also referred to the diabetic and nutrition management  center for further diabetic teaching and weight control.   DISPOSITION:  The patient was discharged to have follow-up in the office  with Mina Marble, M.D. on May 07, 2002.  She will have continued  treatment and  recommendations at that time.                                               Eino Farber., M.D.    GRK/MEDQ  D:  05/16/2002  T:  05/16/2002  Job:  [id number]

## 2010-05-22 NOTE — Discharge Summary (Signed)
Andrea Montgomery, Andrea Montgomery               ACCOUNT NO.:  [id number]   MEDICAL RECORD NO.:  [id number]          PATIENT TYPE:  INP   LOCATION:  9312                          FACILITY:  [facility]   PHYSICIAN:  Roseanna Rainbow, M.D.DATE OF BIRTH:  Jun 23, 1975   DATE OF ADMISSION:  05/19/2004  DATE OF DISCHARGE:  05/22/2004                                 DISCHARGE SUMMARY   CHIEF COMPLAINT:  The patient is a 35 year old gravida 2 para 1 who presents  at 69 and five-sevenths weeks with an estimated date of confinement of  September 09, 2004 complaining of vomiting.   HISTORY OF PRESENT ILLNESS:  Please see the above. She had presented to the  office earlier today with similar complaints. Urine dip was remarkable for  3+ ketonuria in the office. She reports daily vomiting refractory to  antiemetics. She has lost 39 pounds during the pregnancy. Random blood sugar  in the office was 150.   PRENATAL COURSE:  Care with [facility]. Onset of care first trimester.   PREGNANCY COMPLICATIONS OR RISKS:  Pregestational insulin-dependent diabetes  mellitus.   MEDICATIONS:  Prenatal vitamins, NovoLog, Phenergan, Zofran, albuterol  inhaler.   ALLERGIES:  No known drug allergies.   PAST OBSTETRICAL AND GYNECOLOGICAL HISTORY:  She is status post one cesarean  delivery.   PAST MEDICAL HISTORY:  Please see the above, asthma.   PAST SURGICAL HISTORY:  Please see the above.   FAMILY AND SOCIAL HISTORY:  She is single. Father of the baby and family  supportive.   PRENATAL LABORATORY RESULTS:  Type and Rh O positive, antibody screen  negative. Hepatitis B surface antigen negative. RPR nonreactive. Most recent  ultrasound at 22+ weeks which demonstrated good interval growth.   PHYSICAL EXAMINATION:  VITAL SIGNS:  Stable, afebrile.  GENERAL:  Comfortable.  HEART:  Regular rate and rhythm, no murmurs.  LUNGS:  Clear to auscultation bilaterally.  ABDOMEN:  Gravid, nontender.  EXTREMITIES:  Full range of motion  throughout.  NEUROLOGIC:  Alert and oriented x3.   Fetal heart rate Doppler 150s. CMET and prealbumin pending from the office.   ASSESSMENT:  1.  Intrauterine pregnancy at 72 and five-sevenths weeks with hyperemesis      gravidarum.  2.  History of insulin-dependent pregestational diabetes mellitus.   PLAN:  Admission, glycemic control, IV hydration, antiemetics.   HOSPITAL COURSE:  The patient was admitted. She started on Zofran and she  was continued on insulin for glycemic control. On hospital day #1 the Zofran  was discontinued and she was started on Phenergan and Reglan and she was  euglycemic. Then she was discharged to home on hospital day #2 tolerating a  regular diet. A 24-hour urine was sent for protein and creatinine and this  was normal.   DISCHARGE DIAGNOSES:  Intrauterine pregnancy at 23+ weeks with:  1.  Hyperemesis gravidarum.  2.  Pregestational insulin-requiring diabetes mellitus.   CONDITION:  Stable.   DIET:  Diabetic diet.   MEDICATIONS:  Resume home medications, Phenergan, Reglan.   ACTIVITY:  Ad lib.   DISPOSITION:  The patient was to follow  up in the office in 1 week.       LAJ/MEDQ  D:  06/12/2004  T:  06/12/2004  Job:  [id number]

## 2010-05-22 NOTE — Discharge Summary (Signed)
[facility]  Patient:    Andrea Montgomery, Andrea Montgomery Visit Number: [id number] MRN: [id number]          Service Type: MED Location: [id number] Attending Physician:  Katy Apo. Dictated by:   Anastasio Auerbach, M.D. Admit Date:  01/18/2001 Discharge Date: 01/24/2001   CC:         Eino Farber., M.D.             Charlaine Dalton. Sherene Sires, M.D. [facility]             [facility]                           Discharge Summary  DATE OF BIRTH:  Jul 30, 1975  DISCHARGE DIAGNOSES:  1. Asthma exacerbation:     a. Spiral computerized tomography negative for pulmonary embolism.     b. Patchy left lower lobe infiltrate, improved from December 2002.     c. Pansinusitis by computerized tomography scan.  2. Pansinusitis, computerized tomography scan (January 19, 2001).  3. Ongoing tobacco use.  4. Left calf pain, improved:     a. No evidence of deep vein thrombosis by computerized tomography scan.  5. Anxiety disorder.  6. Diabetes mellitus, type 2:     a. December 2002 glycohemoglobin of 5.4, thyroid-stimulating hormone of        0.683.  7. Obesity.  8. History of hypertension.  9. Large blood in the urine:     a. Zero to two red blood cells, creatine kinase normal.     b. Etiology unclear. 10. Chronic back pain. 11. Major depressive disorder, recurrent. 12. Panic disorder with agoraphobia.  DISCHARGE MEDICATIONS:  1. Albuterol MDI two puffs q.6h. x2 weeks.  2. Advair 100/50 b.i.d.  3. (New) Augmentin 875 q.12h. x14 days.  4. Glucotrol 10 mg b.i.d.  5. Glucophage 500 mg b.i.d.  6. (New) Humibid DM two pills b.i.d.  7. (New) Pepcid 20 mg b.i.d.  8. (New) Reglan 10 mg q.a.c. and h.s. x1 month.  9. (New) Nasonex nasal spray two sprays in each nostril q.d. 10. (New) prednisone 10 mg to take four pills on January 25, 2000 and January 25, 2001; three pills on January 26, 2001, January 27, 2001, and January 28, 2001; two pills on January 29, 2001, January 30, 2001, and January 31, 2001; and then one pill on February 01, 2001 and February 02, 2001, and then     stop. 11. Xanax 0.5 mg p.o. t.i.d. 12. Tylenol as needed. 13. Zoloft 50 mg each morning.  CONDITION UPON DISCHARGE:  Stable.  DISPOSITION:  Home.  RECOMMENDED ACTIVITY:  No strenuous.  No driving while on Xanax.  SPECIAL INSTRUCTIONS: 1. Check blood sugar each morning and keep a record for the doctor. 2. If you decide to smoke, your lungs will not get better. 3. Do not smoke.  FOLLOWUP: 1. The patient is to contact Dr. Petra Kuba and schedule follow up in one to    two weeks.  I instructed her to bring all her medications and her discharge    instructions.  Dr. Petra Kuba is apparently the new doctor that will be    listed on her new Medicaid card. 2. Followup with mental health clinic.  We have scheduled her an appointment    for 1:30 p.m. on Friday, January 27, 2001.  She was  given directions as    well as a phone number.  CONSULTATIONS: 1. Michael B. Sherene Sires, M.D. 2. [name], M.D.  HOSPITAL COURSE:  #1 - ASTHMA EXACERBATION.  The patient is a 35 year old obese African American female who was hospitalized in December of 2002 with asthma exacerbation related to pneumonia.  She had a prolonged hospital course that admission, and actually returned the day of discharge with severe anxiety.  She went home and continued to have troubles, and in fact, tells me that she never really got better.  She has suffered for several months with symptoms of fatigue and dyspnea on exertion.  On presentation, she was markedly dyspneic and blood gas on 8 L of oxygen showed a pH of 7.37, pCO2 of 44, and a pO2 of 110.  We did do a CT scan to rule out pulmonary embolus.  There was no evidence of thromboembolic disease, but again, it did show a patchy left lower lobe infiltrate which was improved from December 2002.  Because of her persistent symptoms and difficulties, I did ask  pulmonary to see her.  Dr. Sherene Sires evaluated her and recommended getting a sinus CT.  Sure enough, she did have pansinusitis which may be triggering her persistent symptoms.  He also recommended aggressive treatment for acid reflux disease. She will go home and a 14 day course of Augmentin.  She will be on an inhaler, and of course, we strongly recommended smoking cessation.  She will also receive a prednisone taper.  Other medications as listed above.  #2 - SEVERE DEPRESSION.  This is an incredible obstacle to her overall well-being and recovery.  She also has a panic disorder with agoraphobia. This admission, had a lot of difficulty with her behavior.  She would leave the floor without asking and then call up and request someone to come and get her because she was dizzy.  She would be tearful at times.  At other times, she would be jovial.  Dr. Jeanie Sewer saw her on her last admission and felt she had severe depression with panic disorder.  He did recommend voluntary admission to [facility] for further evaluation, but apparently she declined.  Because of her acting out and also because of the hopelessness that I would sense at times, I did ask him to see her again.  He felt that she indeed had depression.  He did not feel mandatory hospitalization was necessary.   He recommended starting Zoloft 50 mg and increasing by 50 mg a week up to 150.  He also recommended to follow up at mental health.  We have arranged for this.  #3 - GASTROESOPHAGEAL REFLUX DISEASE.  We have aggressively treated this with Pepcid and Reglan.  I would only continue the Reglan for one month.  #4 - DIABETES MELLITUS.  Glycohemoglobin is excellent.  In fact, she may be having some _______.  I have kept her on her same medications for the time being, and she will follow up at [facility].  #5 - TOBACCO USE.  Counseled on cessation.  This is the single most important thing she needs to do.   #6 -  BLOOD IN URINE.  On the dipstick, there was a large amount of blood reported, but only 0-2 rbcs microscopically.  There was no evidence of hemolysis.  Her serum CK was normal.  I am unsure as to why this is occurring. Consider followup.  DISCHARGE AND OTHER PERTINENT LABS:  Hemoglobin 2.4, WBC 23,300 (this was  after high dose steroids), platelet count 460.  ESR 22.  Sodium 134, potassium 4.0, chloride 103, bicarbonate 27, glucose 237, BUN 12, creatinine 0.8. Calcium 8.9.  Liver function tests normal.  ______ natriuretic peptide 16.9. Total CK 84.  Urine pregnancy negative.  Legionella urinary antigen negative. Sputum culture normal flora.  Urine culture multiple species, nonspecific.  PROCEDURES: 1. Chest x-ray (January 18, 2001); suboptimal inspiration with increased    perihilar markings bilaterally. 2. Spiral chest CT (January 18, 2001); no evidence of acute pulmonary embolus.    Somewhat limited in quality due to the patients size.  Patchy bibasilar    opacities, improved from December 26, 2000.  No evidence of DVT. 3. Sinus CT (January 19, 2001); fluid levels within the left sphenoid and    bilateral maxillary sinuses, most significant in the left maxillary sinus.    Mucosal thickening in the left ethmoid sinus.  Hyperplastic frontal    sinuses. Dictated by:   Anastasio Auerbach, M.D. Attending Physician:  Renford Dills D. DD:  02/03/01 TD:  02/03/01 Job: [id number]

## 2010-05-22 NOTE — H&P (Signed)
[facility]  Patient:    Andrea Montgomery, Andrea Montgomery Visit Number: [id number] MRN: [id number]          Service Type: MED Location: [phone] Attending Physician:  Gracelyn Nurse Dictated by:   Renford Dills, M.D. Admit Date:  12/18/2000                           History and Physical  CHIEF COMPLAINT:  Shortness of breath.  HISTORY OF PRESENT ILLNESS:  A 35 year old black female with a history of diabetes, obesity, and asthma, who presents to the ED with complaints of shortness of breath and productive cough x 7-10 days which was not getting better despite increasing her nebulizer treatments at home.  The patient does admit to smoking, but states that she normally does not need her nebulizers more than every other month.  Denied any fever or chills.  No sick contacts.  PAST MEDICAL HISTORY:  As stated above.  Diabetes x 7 years.  No high blood pressure, MI, or CVA.  Obesity.  Asthma; she states she has been asthmatic all of her life and has never been intubated.  Her triggers are pollen and dust. Her last attack was approximately four days prior to admission.  SOCIAL HISTORY:  Positive for tobacco, half of a pack per day.  No alcohol and no drugs.  MEDICATIONS:  Glucotrol and albuterol.  PAST SURGICAL HISTORY:  Significant for a C-section in 1998.  FAMILY HISTORY:  Significant for diabetes in her father.  ALLERGIES:  There are no known drug allergies.  REVIEW OF SYSTEMS:  As stated in the initial H&P.  PHYSICAL EXAMINATION:  GENERAL APPEARANCE:  The patient was comfortable, but had difficulty with speaking.  No accessory muscle use.  HEENT:  Unremarkable.  CHEST:  Poor air movement.  Positive expiratory wheeze.  No rales.  CARDIOVASCULAR:  Regular.  No S3.  ABDOMEN:  Obese.  No hepatosplenomegaly.  EXTREMITIES:  No clubbing, cyanosis, or edema.  RECTAL:  Deferred.  BREASTS:  Deferred.  LABORATORY DATA:  Pending at the time of  admission.  The chest x-ray was negative for pneumonia.  ASSESSMENT AND PLAN: 1. Asthma exacerbation in a smoker.  The patient will be admitted to a    medicine floor bed.  Will check a chest x-ray which has been done.  Will    continue the patient on O2 nebulizers, antibiotics, and steroids.  Will    check peak flow pre and post nebulizer treatments.  Recommended that the    patient stop smoking.  Check an ABG. 2. Diabetes.  Continue the patients outpatient medicines.  Check a hemoglobin    A1C.  Continue sliding scale insulin coverage. 3. Obesity. 4. Elevated blood pressure.  Will follow up and make further recommendations after review of the above studies.  Thank you in advance. Dictated by:   Renford Dills, M.D. Attending Physician:  Marcelino Duster D DD:  12/20/00 TD:  12/20/00 Job: [id number]

## 2010-10-21 LAB — URINALYSIS, ROUTINE W REFLEX MICROSCOPIC
Bilirubin Urine: NEGATIVE
Glucose, UA: 100 — AB
Leukocytes, UA: NEGATIVE
Nitrite: NEGATIVE
Protein, ur: 30 — AB
Specific Gravity, Urine: 1.015
Specific Gravity, Urine: >1.03 — ABNORMAL HIGH
Urobilinogen, UA: 0.2
Urobilinogen, UA: 0.2

## 2010-10-21 LAB — CBC
MCHC: 31.4
MCV: 78.7
Platelets: 348
WBC: 12.1 — ABNORMAL HIGH

## 2010-10-21 LAB — COMPREHENSIVE METABOLIC PANEL
AST: 24
Albumin: 4.2
Calcium: 9.6
Creatinine, Ser: 0.86
GFR calc Af Amer: >60
Total Protein: 7.8

## 2010-10-21 LAB — WET PREP, GENITAL

## 2010-10-21 LAB — URINE MICROSCOPIC-ADD ON

## 2010-10-21 LAB — POCT PREGNANCY, URINE: Operator id: 120561

## 2011-05-12 ENCOUNTER — Emergency Department (HOSPITAL_COMMUNITY)
Admission: EM | Admit: 2011-05-12 | Discharge: 2011-05-12 | Disposition: A | Discharge status: Home / Self Care | Payer: Medicaid Other | Attending: Emergency Medicine | Admitting: Emergency Medicine

## 2011-05-12 ENCOUNTER — Emergency Department (HOSPITAL_COMMUNITY): Payer: Medicaid Other

## 2011-05-12 ENCOUNTER — Encounter (HOSPITAL_COMMUNITY): Payer: Self-pay | Admitting: *Deleted

## 2011-05-12 DIAGNOSIS — J440 Chronic obstructive pulmonary disease with acute lower respiratory infection: Secondary | ICD-10-CM | POA: Insufficient documentation

## 2011-05-12 DIAGNOSIS — J209 Acute bronchitis, unspecified: Secondary | ICD-10-CM | POA: Insufficient documentation

## 2011-05-12 DIAGNOSIS — R0602 Shortness of breath: Secondary | ICD-10-CM | POA: Insufficient documentation

## 2011-05-12 DIAGNOSIS — Z79899 Other long term (current) drug therapy: Secondary | ICD-10-CM | POA: Insufficient documentation

## 2011-05-12 DIAGNOSIS — I10 Essential (primary) hypertension: Secondary | ICD-10-CM | POA: Insufficient documentation

## 2011-05-12 DIAGNOSIS — Z794 Long term (current) use of insulin: Secondary | ICD-10-CM | POA: Insufficient documentation

## 2011-05-12 DIAGNOSIS — E119 Type 2 diabetes mellitus without complications: Secondary | ICD-10-CM | POA: Insufficient documentation

## 2011-05-12 DIAGNOSIS — J40 Bronchitis, not specified as acute or chronic: Secondary | ICD-10-CM

## 2011-05-12 DIAGNOSIS — J441 Chronic obstructive pulmonary disease with (acute) exacerbation: Secondary | ICD-10-CM | POA: Insufficient documentation

## 2011-05-12 DIAGNOSIS — F17200 Nicotine dependence, unspecified, uncomplicated: Secondary | ICD-10-CM | POA: Insufficient documentation

## 2011-05-12 DIAGNOSIS — R079 Chest pain, unspecified: Secondary | ICD-10-CM | POA: Insufficient documentation

## 2011-05-12 HISTORY — DX: Chronic obstructive pulmonary disease, unspecified: J44.9

## 2011-05-12 HISTORY — DX: Essential (primary) hypertension: I10

## 2011-05-12 LAB — COMPREHENSIVE METABOLIC PANEL
ALT: 30 U/L (ref 0–35)
AST: 28 U/L (ref 0–37)
Albumin: 3.7 g/dL (ref 3.5–5.2)
CO2: 24 mEq/L (ref 19–32)
Chloride: 102 mEq/L (ref 96–112)
GFR calc non Af Amer: >90 mL/min (ref >90)
Sodium: 137 mEq/L (ref 135–145)
Total Bilirubin: 0.2 mg/dL — ABNORMAL LOW (ref 0.3–1.2)

## 2011-05-12 LAB — TROPONIN I: Troponin I: <0.3 ng/mL (ref <0.30)

## 2011-05-12 LAB — CBC
HCT: 34 % — ABNORMAL LOW (ref 36.0–46.0)
MCHC: 29.4 g/dL — ABNORMAL LOW (ref 30.0–36.0)
Platelets: 526 10*3/uL — ABNORMAL HIGH (ref 150–400)
RDW: 19.2 % — ABNORMAL HIGH (ref 11.5–15.5)
WBC: 16.2 10*3/uL — ABNORMAL HIGH (ref 4.0–10.5)

## 2011-05-12 LAB — DIFFERENTIAL
Basophils Absolute: 0 10*3/uL (ref 0.0–0.1)
Basophils Relative: 0 % (ref 0–1)
Lymphocytes Relative: 28 % (ref 12–46)
Neutro Abs: 10.5 10*3/uL — ABNORMAL HIGH (ref 1.7–7.7)
Neutrophils Relative %: 65 % (ref 43–77)

## 2011-05-12 MED ORDER — AZITHROMYCIN 250 MG PO TABS: ORAL_TABLET | ORAL | Status: DC

## 2011-05-12 MED ORDER — IPRATROPIUM BROMIDE 0.02 % IN SOLN
0.5000 mg | Freq: Once | RESPIRATORY_TRACT | Status: AC
  Administered 2011-05-12: 0.5 mg via RESPIRATORY_TRACT
  Filled 2011-05-12: qty 2.5

## 2011-05-12 MED ORDER — AZITHROMYCIN 250 MG PO TABS
500.0000 mg | ORAL_TABLET | Freq: Once | ORAL | Status: AC
  Administered 2011-05-12: 500 mg via ORAL
  Filled 2011-05-12: qty 2

## 2011-05-12 MED ORDER — ALBUTEROL SULFATE (5 MG/ML) 0.5% IN NEBU
5.0000 mg | INHALATION_SOLUTION | Freq: Once | RESPIRATORY_TRACT | Status: AC
  Administered 2011-05-12: 5 mg via RESPIRATORY_TRACT
  Filled 2011-05-12: qty 0.5

## 2011-05-12 MED ORDER — FUROSEMIDE 10 MG/ML IJ SOLN
40.0000 mg | Freq: Once | INTRAMUSCULAR | Status: AC
  Administered 2011-05-12: 40 mg via INTRAVENOUS
  Filled 2011-05-12: qty 4

## 2011-05-12 MED ORDER — ALBUTEROL SULFATE (5 MG/ML) 0.5% IN NEBU
5.0000 mg | INHALATION_SOLUTION | Freq: Once | RESPIRATORY_TRACT | Status: AC
  Administered 2011-05-12: 5 mg via RESPIRATORY_TRACT
  Filled 2011-05-12: qty 1

## 2011-05-12 MED ORDER — METHYLPREDNISOLONE SODIUM SUCC 125 MG IJ SOLR
125.0000 mg | Freq: Once | INTRAMUSCULAR | Status: AC
  Administered 2011-05-12: 125 mg via INTRAVENOUS
  Filled 2011-05-12: qty 2

## 2011-05-12 NOTE — ED Notes (Signed)
Pt reports sob started 2 hours ago, tried breathing tx at home with no relief, reports cough (pt is still a current smoker) denies fever

## 2011-05-12 NOTE — Discharge Instructions (Signed)
Follow up in two days for recheck °

## 2011-05-12 NOTE — ED Provider Notes (Signed)
History     CSN: [id number]  Arrival date & time 05/12/11  1800   First MD Initiated Contact with Patient 05/12/11 1948      Chief Complaint  Patient presents with  . Shortness of Breath  . Asthma    (Consider location/radiation/quality/duration/timing/severity/associated sxs/prior treatment) Patient is a 36 y.o. female presenting with shortness of breath and asthma. The history is provided by the patient (the pt complains of cough and sob). No language interpreter was used.  Shortness of Breath  The current episode started today. The onset was sudden. The problem occurs frequently. The problem has been unchanged. The problem is moderate. The symptoms are relieved by nothing. The symptoms are aggravated by nothing. Associated symptoms include shortness of breath. Pertinent negatives include no chest pain and no cough. She has had intermittent steroid use. Her past medical history is significant for asthma.  Asthma Associated symptoms include shortness of breath. Pertinent negatives include no chest pain, no abdominal pain and no headaches.    Past Medical History  Diagnosis Date  . Asthma   . COPD (chronic obstructive pulmonary disease)   . Diabetes mellitus   . Hypertension     History reviewed. No pertinent past surgical history.  History reviewed. No pertinent family history.  History  Substance Use Topics  . Smoking status: Current Everyday Smoker  . Smokeless tobacco: Not on file  . Alcohol Use: Yes     occ    OB History    Grav Para Term Preterm Abortions TAB SAB Ect Mult Living                  Review of Systems  Constitutional: Negative for fatigue.  HENT: Negative for congestion, sinus pressure and ear discharge.   Eyes: Negative for discharge.  Respiratory: Positive for shortness of breath. Negative for cough.   Cardiovascular: Negative for chest pain.  Gastrointestinal: Negative for abdominal pain and diarrhea.  Genitourinary: Negative for frequency  and hematuria.  Musculoskeletal: Negative for back pain.  Skin: Negative for rash.  Neurological: Negative for seizures and headaches.  Hematological: Negative.   Psychiatric/Behavioral: Negative for hallucinations.    Allergies  Review of patient's allergies indicates no known allergies.  Home Medications   Current Outpatient Rx  Name Route Sig Dispense Refill  . ALBUTEROL SULFATE HFA 108 (90 BASE) MCG/ACT IN AERS Inhalation Inhale 2 puffs into the lungs every 6 (six) hours as needed.    . ALPRAZOLAM 1 MG PO TABS Oral Take 1 mg by mouth 4 (four) times daily.    Marland Kitchen FLUTICASONE-SALMETEROL 250-50 MCG/DOSE IN AEPB Inhalation Inhale 1 puff into the lungs every 12 (twelve) hours. For shortness of breath    . FUROSEMIDE 40 MG PO TABS Oral Take 40 mg by mouth daily.    . INSULIN ASPART PROT & ASPART (70-30) 100 UNIT/ML [place] SUSP Subcutaneous Inject 15-25 Units into the skin. 15units in the morning and 25 units at night    . OLMESARTAN MEDOXOMIL 20 MG PO TABS Oral Take 20 mg by mouth daily.    . AZITHROMYCIN 250 MG PO TABS  Take one tablet eachday 4 tablet 0    BP 122/65  Pulse 101  Temp(Src) 98.7 F (37.1 C) (Oral)  Resp 25  SpO2 100%  LMP 05/05/2011  Physical Exam  Constitutional: She is oriented to person, place, and time. She appears well-developed.  HENT:  Head: Normocephalic and atraumatic.  Eyes: Conjunctivae and EOM are normal. No scleral icterus.  Neck:  Neck supple. No thyromegaly present.  Cardiovascular: Normal rate and regular rhythm.  Exam reveals no gallop and no friction rub.   No murmur heard. Pulmonary/Chest: No stridor. She has wheezes. She has no rales. She exhibits no tenderness.  Abdominal: She exhibits no distension. There is no tenderness. There is no rebound.  Musculoskeletal: Normal range of motion. She exhibits no edema.  Lymphadenopathy:    She has no cervical adenopathy.  Neurological: She is oriented to person, place, and time. Coordination normal.    Skin: No rash noted. No erythema.  Psychiatric: She has a normal mood and affect. Her behavior is normal.    ED Course  Procedures (including critical care time)  Labs Reviewed  CBC - Abnormal; Notable for the following:    WBC 16.2 (*)    Hemoglobin 10.0 (*)    HCT 34.0 (*)    MCV 66.7 (*)    MCH 19.6 (*)    MCHC 29.4 (*)    RDW 19.2 (*)    Platelets 526 (*)    All other components within normal limits  DIFFERENTIAL - Abnormal; Notable for the following:    Neutro Abs 10.5 (*)    Lymphs Abs 4.6 (*)    All other components within normal limits  COMPREHENSIVE METABOLIC PANEL - Abnormal; Notable for the following:    Glucose, Bld 250 (*)    Total Bilirubin 0.2 (*)    All other components within normal limits  PRO B NATRIURETIC PEPTIDE  TROPONIN I   Dg Chest 2 View  05/12/2011  *RADIOLOGY REPORT*  Clinical Data: Shortness of breath and chest pain.  CHEST - 2 VIEW  Comparison: 02/15/2009  Findings: The cardiopericardial silhouette is enlarged. There is pulmonary vascular congestion without overt pulmonary edema.  There is some minimal atelectasis at the bases.  No overt edema or focal lung consolidation.  No pleural effusion. Imaged bony structures of the thorax are intact.  IMPRESSION: Cardiomegaly with vascular congestion and minimal basilar atelectasis.  Original Report Authenticated By: ERIC A. MANSELL, M.D.     No diagnosis found.   Pt improved with tx MDM          Benny Lennert, MD 05/12/11 2225

## 2011-10-18 ENCOUNTER — Encounter (HOSPITAL_COMMUNITY): Payer: Self-pay

## 2011-10-18 ENCOUNTER — Emergency Department (HOSPITAL_COMMUNITY)
Admission: EM | Admit: 2011-10-18 | Discharge: 2011-10-18 | Disposition: A | Discharge status: Nursing Home (Includes ICF) | Payer: Medicaid Other | Source: Home / Self Care

## 2011-10-18 DIAGNOSIS — R079 Chest pain, unspecified: Secondary | ICD-10-CM

## 2011-10-18 DIAGNOSIS — R51 Headache: Secondary | ICD-10-CM

## 2011-10-18 DIAGNOSIS — E119 Type 2 diabetes mellitus without complications: Secondary | ICD-10-CM

## 2011-10-18 DIAGNOSIS — R600 Localized edema: Secondary | ICD-10-CM

## 2011-10-18 DIAGNOSIS — J45909 Unspecified asthma, uncomplicated: Secondary | ICD-10-CM

## 2011-10-18 DIAGNOSIS — R609 Edema, unspecified: Secondary | ICD-10-CM

## 2011-10-18 DIAGNOSIS — R0600 Dyspnea, unspecified: Secondary | ICD-10-CM

## 2011-10-18 DIAGNOSIS — R0989 Other specified symptoms and signs involving the circulatory and respiratory systems: Secondary | ICD-10-CM

## 2011-10-18 NOTE — ED Provider Notes (Signed)
History     CSN: [id number]  Arrival date & time 10/18/11  1015   None     Chief Complaint  Patient presents with  . Leg Swelling    (Consider location/radiation/quality/duration/timing/severity/associated sxs/prior treatment) HPI Comments: This 36 year old female presents with several acute and chronic complaints. She has a history of asthma, COPD, diabetes mellitus, hypertension and cardiomegaly. Her acute complaints include bilateral leg and hand edema for 2 days. She also complains of having the worst headache of her life. Headache is located in the frontal aspect of the cranium. She is also complaining of mild shortness of breath and anterior chest pain.   Past Medical History  Diagnosis Date  . Asthma   . COPD (chronic obstructive pulmonary disease)   . Diabetes mellitus   . Hypertension     History reviewed. No pertinent past surgical history.  No family history on file.  History  Substance Use Topics  . Smoking status: Current Every Day Smoker  . Smokeless tobacco: Not on file  . Alcohol Use: Yes     occ    OB History    Grav Para Term Preterm Abortions TAB SAB Ect Mult Living                  Review of Systems  Constitutional: Positive for activity change and fatigue. Negative for fever.  HENT: Positive for ear pain, congestion, voice change and postnasal drip. Negative for facial swelling and neck pain.   Eyes: Negative for pain and redness.  Respiratory: Positive for shortness of breath and wheezing.   Cardiovascular: Positive for chest pain and leg swelling.  Genitourinary: Negative.   Musculoskeletal: Negative.   Skin: Negative for pallor and rash.  Neurological: Positive for headaches. Negative for seizures and speech difficulty.    Allergies  Review of patient's allergies indicates no known allergies.  Home Medications   Current Outpatient Rx  Name Route Sig Dispense Refill  . ALBUTEROL SULFATE HFA 108 (90 BASE) MCG/ACT IN AERS Inhalation  Inhale 2 puffs into the lungs every 6 (six) hours as needed.    . ALPRAZOLAM 1 MG PO TABS Oral Take 1 mg by mouth 4 (four) times daily.    Marland Kitchen FLUTICASONE-SALMETEROL 250-50 MCG/DOSE IN AEPB Inhalation Inhale 1 puff into the lungs every 12 (twelve) hours. For shortness of breath    . FUROSEMIDE 40 MG PO TABS Oral Take 40 mg by mouth daily.    . INSULIN ASPART PROT & ASPART (70-30) 100 UNIT/ML [place] SUSP Subcutaneous Inject 15-25 Units into the skin 2 (two) times daily with a meal. 15units in the morning and 25 units at night    . OLMESARTAN MEDOXOMIL 20 MG PO TABS Oral Take 20 mg by mouth daily.    . AZITHROMYCIN 250 MG PO TABS  Take one tablet eachday 4 tablet 0    BP 153/93  Pulse 86  Temp 98.5 F (36.9 C) (Oral)  Resp 20  SpO2 98%  LMP 09/27/2011  Physical Exam  Nursing note and vitals reviewed. Constitutional: She is oriented to person, place, and time. She appears well-developed and well-nourished.  HENT:  Right Ear: External ear normal.  Left Ear: External ear normal.  Mouth/Throat: Oropharynx is clear and moist. No oropharyngeal exudate.  Eyes: EOM are normal. Pupils are equal, round, and reactive to light.  Neck: Normal range of motion. Neck supple.  Cardiovascular: Normal rate and normal heart sounds.   Pulmonary/Chest:       Mildly increased effort  with respirations. There are inspiratory and expiratory throaty sounds as if there was some narrowing. The best description of this is the"Darth Vader"nail with breathing. There are bilaterally diminished breath sounds with some wheezing.  Musculoskeletal: She exhibits edema and tenderness.       2+ pitting edema in LE's. Edema in hands and digits.   Neurological: She is alert and oriented to person, place, and time.  Skin: Skin is warm and dry. No rash noted. No erythema.    ED Course  Procedures (including critical care time)  Labs Reviewed - No data to display No results found.   1. Dyspnea and respiratory abnormalities     2. Chest pain   3. Edema extremities   4. Headache in front of head   5. Diabetes mellitus   6. Asthma       MDM  Transfer to the emergency department for further evaluation. Due to her acute problems and chronic conditions we're unable to provide a thorough workup and management plan for her        Hayden Rasmussen, NP 10/18/11 1243

## 2011-10-18 NOTE — ED Notes (Signed)
C/o both legs, both hands swelling since friday

## 2011-10-19 NOTE — ED Provider Notes (Signed)
Medical screening examination/treatment/procedure(s) were performed by non-physician practitioner and as supervising physician I was immediately available for consultation/collaboration.  Leslee Home, M.D.   Reuben Likes, MD 10/19/11 [phone]

## 2013-01-10 ENCOUNTER — Encounter (HOSPITAL_BASED_OUTPATIENT_CLINIC_OR_DEPARTMENT_OTHER): Payer: Medicaid Other

## 2013-01-23 ENCOUNTER — Encounter (HOSPITAL_BASED_OUTPATIENT_CLINIC_OR_DEPARTMENT_OTHER): Payer: Medicaid Other

## 2013-02-12 ENCOUNTER — Ambulatory Visit (HOSPITAL_BASED_OUTPATIENT_CLINIC_OR_DEPARTMENT_OTHER): Payer: Medicaid Other | Attending: Pulmonary Disease

## 2013-02-12 VITALS — Ht 62.0 in | Wt 240.0 lb

## 2013-02-12 DIAGNOSIS — Z9989 Dependence on other enabling machines and devices: Secondary | ICD-10-CM

## 2013-02-12 DIAGNOSIS — G4733 Obstructive sleep apnea (adult) (pediatric): Secondary | ICD-10-CM | POA: Insufficient documentation

## 2013-02-17 DIAGNOSIS — G4733 Obstructive sleep apnea (adult) (pediatric): Secondary | ICD-10-CM

## 2013-02-17 NOTE — Sleep Study (Signed)
   NAME: Andrea Montgomery DATE OF BIRTH:  November 17, 1975 MEDICAL RECORD NUMBER [id number]  LOCATION: [facility]  PHYSICIAN: Tuyen Uncapher D  DATE OF STUDY: 02/12/2013  SLEEP STUDY TYPE: Nocturnal Polysomnogram               REFERRING PHYSICIAN: Leola Brazil*  INDICATION FOR STUDY: Hypersomnia with sleep apnea  EPWORTH SLEEPINESS SCORE:   15/24 HEIGHT: 5\' 2"  (157.5 cm)  WEIGHT: 240 lb (108.863 kg)    Body mass index is 43.89 kg/(m^2).  NECK SIZE: 15 in.  MEDICATIONS: Charted for review  SLEEP ARCHITECTURE: Split study protocol. During the diagnostic phase total sleep time 120.5 minutes with sleep efficiency 80.1%. Stage I was 5.8%, stage II 80.9%, stage III absent, REM 13.3% of total sleep time. Sleep latency 2 minutes, REM latency 66 minutes, awake after sleep onset 28 minutes, arousal index 7. Bedtime medication: None  RESPIRATORY DATA: Split study protocol. Apnea hypotony index (AHI) 23.4 per hour. 47 events scored including 4 obstructive apneas and 43 hypopneas. All events were associated with supine sleep position. REM AHI 90. CPAP was titrated to 9 CWP, AHI 0 per hour. She wore a medium [employer] Simplus mask with heated humidifier.  OXYGEN DATA: Moderate snoring before CPAP with oxygen desaturation to a nadir of 73%. With CPAP control, snoring was prevented and mean oxygen saturation held 92% on room air.  CARDIAC DATA: Normal sinus rhythm  MOVEMENT/PARASOMNIA: No significant movement disturbance. Bathroom x2  IMPRESSION/ RECOMMENDATION:   1) Moderate obstructive sleep apnea/hypopneas syndrome, mainly hypopneas while supine. AHI 23.4 per hour. Moderate snoring with oxygen desaturation to a nadir of 73% on room air. 2) Successful CPAP titration to 9 CWP, AHI 0 per hour. She wore a medium [employer] Simplus mask with heated humidifier. Snoring was prevented and mean oxygen saturation held 92% on room air.  Signed Baird Lyons M.D. Deneise Lever Diplomate, American Board of Sleep Medicine  ELECTRONICALLY SIGNED ON:  02/17/2013, 9:54 AM [place] PH: [phone]   FX: [phone] [place]

## 2013-03-06 ENCOUNTER — Other Ambulatory Visit: Payer: Self-pay | Admitting: Pulmonary Disease

## 2013-03-06 DIAGNOSIS — N644 Mastodynia: Secondary | ICD-10-CM

## 2013-03-20 ENCOUNTER — Other Ambulatory Visit: Payer: Medicaid Other

## 2013-03-29 ENCOUNTER — Ambulatory Visit
Admission: RE | Admit: 2013-03-29 | Discharge: 2013-03-29 | Disposition: A | Discharge status: Home / Self Care | Payer: Medicaid Other | Source: Ambulatory Visit | Attending: Pulmonary Disease | Admitting: Pulmonary Disease

## 2013-03-29 DIAGNOSIS — N644 Mastodynia: Secondary | ICD-10-CM

## 2013-04-11 ENCOUNTER — Other Ambulatory Visit: Payer: Self-pay | Admitting: Pulmonary Disease

## 2013-04-11 ENCOUNTER — Ambulatory Visit
Admission: RE | Admit: 2013-04-11 | Discharge: 2013-04-11 | Disposition: A | Discharge status: Home / Self Care | Payer: Medicaid Other | Source: Ambulatory Visit | Attending: Pulmonary Disease | Admitting: Pulmonary Disease

## 2013-04-11 ENCOUNTER — Ambulatory Visit: Admission: RE | Admit: 2013-04-11 | Payer: Medicaid Other | Source: Ambulatory Visit

## 2013-04-11 DIAGNOSIS — N644 Mastodynia: Secondary | ICD-10-CM

## 2013-12-03 ENCOUNTER — Ambulatory Visit: Payer: Medicaid Other | Admitting: Obstetrics

## 2014-01-16 ENCOUNTER — Other Ambulatory Visit: Payer: Self-pay | Admitting: Orthopedic Surgery

## 2014-01-16 DIAGNOSIS — M25561 Pain in right knee: Secondary | ICD-10-CM

## 2014-02-11 ENCOUNTER — Ambulatory Visit
Admission: RE | Admit: 2014-02-11 | Discharge: 2014-02-11 | Disposition: A | Discharge status: Home / Self Care | Payer: Medicaid Other | Source: Ambulatory Visit | Attending: Orthopedic Surgery | Admitting: Orthopedic Surgery

## 2014-02-11 DIAGNOSIS — M25561 Pain in right knee: Secondary | ICD-10-CM

## 2014-02-20 ENCOUNTER — Telehealth: Payer: Self-pay | Admitting: Hematology and Oncology

## 2014-02-20 NOTE — Telephone Encounter (Signed)
Lt mess regarding 03/12/14 at 1:30pm w/ Alvy Bimler Dx: see MRI rt knee report impression Referring Dr. Marlou Sa

## 2014-02-22 ENCOUNTER — Telehealth: Payer: Self-pay | Admitting: Hematology and Oncology

## 2014-02-25 ENCOUNTER — Telehealth: Payer: Self-pay | Admitting: Hematology and Oncology

## 2014-02-25 NOTE — Telephone Encounter (Signed)
THIRD MESSAGE LEFT WITH PATIENT ON 02/25/14 TG.

## 2014-02-27 ENCOUNTER — Telehealth: Payer: Self-pay | Admitting: Hematology and Oncology

## 2014-02-27 NOTE — Telephone Encounter (Signed)
Pt confirmed appt on 03/12/14 at 1:30pm

## 2014-03-12 ENCOUNTER — Ambulatory Visit (HOSPITAL_BASED_OUTPATIENT_CLINIC_OR_DEPARTMENT_OTHER): Payer: Medicaid Other | Admitting: Hematology and Oncology

## 2014-03-12 ENCOUNTER — Ambulatory Visit: Payer: Medicaid Other

## 2014-03-12 ENCOUNTER — Encounter: Payer: Self-pay | Admitting: Hematology and Oncology

## 2014-03-12 ENCOUNTER — Telehealth: Payer: Self-pay | Admitting: *Deleted

## 2014-03-12 ENCOUNTER — Encounter (INDEPENDENT_AMBULATORY_CARE_PROVIDER_SITE_OTHER): Payer: Self-pay

## 2014-03-12 ENCOUNTER — Ambulatory Visit (HOSPITAL_BASED_OUTPATIENT_CLINIC_OR_DEPARTMENT_OTHER): Payer: Medicaid Other

## 2014-03-12 ENCOUNTER — Telehealth: Payer: Self-pay | Admitting: Hematology and Oncology

## 2014-03-12 VITALS — BP 147/78 | HR 83 | Temp 98.4°F | Resp 25 | Ht 61.0 in | Wt 238.0 lb

## 2014-03-12 DIAGNOSIS — R936 Abnormal findings on diagnostic imaging of limbs: Secondary | ICD-10-CM

## 2014-03-12 DIAGNOSIS — M25562 Pain in left knee: Secondary | ICD-10-CM

## 2014-03-12 DIAGNOSIS — D509 Iron deficiency anemia, unspecified: Secondary | ICD-10-CM

## 2014-03-12 DIAGNOSIS — N921 Excessive and frequent menstruation with irregular cycle: Secondary | ICD-10-CM

## 2014-03-12 DIAGNOSIS — R938 Abnormal findings on diagnostic imaging of other specified body structures: Secondary | ICD-10-CM

## 2014-03-12 DIAGNOSIS — G8929 Other chronic pain: Secondary | ICD-10-CM

## 2014-03-12 DIAGNOSIS — M25569 Pain in unspecified knee: Secondary | ICD-10-CM

## 2014-03-12 DIAGNOSIS — M25561 Pain in right knee: Secondary | ICD-10-CM

## 2014-03-12 HISTORY — DX: Iron deficiency anemia, unspecified: D50.9

## 2014-03-12 LAB — CBC & DIFF AND RETIC
BASO%: 0.2 % (ref 0.0–2.0)
Basophils Absolute: 0 10*3/uL (ref 0.0–0.1)
EOS%: 1.9 % (ref 0.0–7.0)
Eosinophils Absolute: 0.2 10*3/uL (ref 0.0–0.5)
HCT: 31.8 % — ABNORMAL LOW (ref 34.8–46.6)
HGB: 8.8 g/dL — ABNORMAL LOW (ref 11.6–15.9)
IMMATURE RETIC FRACT: 17.3 % — AB (ref 1.60–10.00)
LYMPH%: 35.1 % (ref 14.0–49.7)
MCH: 17.9 pg — AB (ref 25.1–34.0)
MCHC: 27.7 g/dL — ABNORMAL LOW (ref 31.5–36.0)
MCV: 64.6 fL — AB (ref 79.5–101.0)
MONO#: 0.7 10*3/uL (ref 0.1–0.9)
MONO%: 7.1 % (ref 0.0–14.0)
NEUT#: 5.1 10*3/uL (ref 1.5–6.5)
NEUT%: 55.7 % (ref 38.4–76.8)
Platelets: 381 10*3/uL (ref 145–400)
RBC: 4.92 10*6/uL (ref 3.70–5.45)
RDW: 19.3 % — AB (ref 11.2–14.5)
Retic %: 1.74 % (ref 0.70–2.10)
Retic Ct Abs: 85.61 10*3/uL (ref 33.70–90.70)
WBC: 9.1 10*3/uL (ref 3.9–10.3)
lymph#: 3.2 10*3/uL (ref 0.9–3.3)
nRBC: 0 % (ref 0–0)

## 2014-03-12 LAB — SEDIMENTATION RATE: SED RATE: 22 mm/h — AB (ref 0–20)

## 2014-03-12 LAB — FERRITIN CHCC: FERRITIN: 7 ng/mL — AB (ref 9–269)

## 2014-03-12 LAB — IRON AND TIBC CHCC
%SAT: 4 % — AB (ref 21–57)
IRON: 20 ug/dL — AB (ref 41–142)
TIBC: 491 ug/dL — AB (ref 236–444)
UIBC: 471 ug/dL — AB (ref 120–384)

## 2014-03-12 LAB — HOLD TUBE, BLOOD BANK

## 2014-03-12 NOTE — Telephone Encounter (Signed)
Pt confirmed labs/ov per 03/08 POF, gave pt AVS..... KJ °

## 2014-03-12 NOTE — Progress Notes (Signed)
Checked in new pt with no financial concerns at this time.  Pts insurance should pay at 101% but she has my card for any billing questions or concerns.

## 2014-03-12 NOTE — Telephone Encounter (Signed)
Per staff message and POF I have scheduled appts. Advised scheduler of appts. JMW  

## 2014-03-13 ENCOUNTER — Encounter: Payer: Self-pay | Admitting: Hematology and Oncology

## 2014-03-13 ENCOUNTER — Telehealth: Payer: Self-pay | Admitting: Hematology and Oncology

## 2014-03-13 DIAGNOSIS — N920 Excessive and frequent menstruation with regular cycle: Secondary | ICD-10-CM | POA: Insufficient documentation

## 2014-03-13 DIAGNOSIS — N921 Excessive and frequent menstruation with irregular cycle: Secondary | ICD-10-CM | POA: Insufficient documentation

## 2014-03-13 DIAGNOSIS — M25569 Pain in unspecified knee: Secondary | ICD-10-CM

## 2014-03-13 DIAGNOSIS — G8929 Other chronic pain: Secondary | ICD-10-CM | POA: Insufficient documentation

## 2014-03-13 DIAGNOSIS — R936 Abnormal findings on diagnostic imaging of limbs: Secondary | ICD-10-CM | POA: Insufficient documentation

## 2014-03-13 NOTE — Telephone Encounter (Signed)
Mailed schedule to pt with updated IV Feraheme added for next couple of weeks. KJ

## 2014-03-13 NOTE — Assessment & Plan Note (Signed)
I recommend she consult with PCP for management of this. In the meantime, I will try to treat her severe iron deficiency anemia.

## 2014-03-13 NOTE — Assessment & Plan Note (Signed)
I reviewed the MRI myself and felt that the changes seen is consistent with severe anemia.

## 2014-03-13 NOTE — Assessment & Plan Note (Signed)
The most likely cause of her anemia is due to chronic blood loss. We discussed some of the risks, benefits, and alternatives of intravenous iron infusions. The patient is symptomatic from anemia and the iron level is critically low. She tolerated oral iron supplement poorly and desires to achieved higher levels of iron faster for adequate hematopoesis. Some of the side-effects to be expected including risks of infusion reactions, phlebitis, headaches, nausea and fatigue.  The patient is willing to proceed. Patient education material was dispensed.  Goal is to keep ferritin level greater than 50 Repeat stat hemoglobin showed that she does not need blood transfusion. I recommend the patient to start taking prenatal vitamin daily. I also told the patient to stop taking ibuprofen if possible. She needs to make an appointment to see her primary care provider to discuss management of heavy menorrhagia.

## 2014-03-13 NOTE — Assessment & Plan Note (Signed)
She has chronic bilateral knee pain for a while. She's been taking ibuprofen excessively. I recommend the patient to refrain from taking ibuprofen if possible.

## 2014-03-13 NOTE — Progress Notes (Signed)
[place] [facility] CONSULT NOTE  Patient Care Team: Vincente Liberty, MD as PCP - General (Pulmonary Disease) Heath Lark, MD as Consulting Physician (Hematology and Oncology)  CHIEF COMPLAINTS/PURPOSE OF CONSULTATION:  Abnormal MRI changes, severe anemia  HISTORY OF PRESENTING ILLNESS:  Andrea Montgomery 39 y.o. female is here because of abnormal MRI changes. She have chronic knee pain for over 8 months. The right knee bother her more than the left knee she has been taking a lot of ibuprofen. The MRI suggested some bone marrow abnormality changes. I do not have a copy of her most recent blood work. Her last CBC from 2013 show hemoglobin of 10 with low MCV.  She denies recent chest pain on exertion, shortness of breath on minimal exertion, pre-syncopal episodes, or palpitations. However, she had profound fatigue and leg cramps. She had not noticed any recent bleeding such as epistaxis, hematuria or melena. She have heavy menstruation and she would bleed 10 days of her cycle of every 30 days with passage of large amount of clots. She also had regular hematochezia on a weekly basis after straining for bowel movement. She is not on antiplatelets agents. She never had colonoscopy She had no prior history or diagnosis of cancer. Her age appropriate screening programs are up-to-date. She eats a variety of diet. She has excessive pica with chewing of ice. She never donated blood or received blood transfusion The patient was never prescribed oral iron supplement. She has significant reflux on a regular basis. MEDICAL HISTORY:  Past Medical History  Diagnosis Date  . Asthma   . COPD (chronic obstructive pulmonary disease)   . Diabetes mellitus   . Hypertension   . Iron deficiency anemia 03/12/2014    SURGICAL HISTORY: No past surgical history on file.  SOCIAL HISTORY: History   Social History  . Marital Status: Married    Spouse Name: N/A  . Number of Children: N/A  . Years of  Education: N/A   Occupational History  . Not on file.   Social History Main Topics  . Smoking status: Current Every Day Smoker  . Smokeless tobacco: Not on file  . Alcohol Use: Yes     Comment: occ  . Drug Use: Not on file  . Sexual Activity: Not on file   Other Topics Concern  . Not on file   Social History Narrative    FAMILY HISTORY: No family history on file.  ALLERGIES:  has No Known Allergies.  MEDICATIONS:  Current Outpatient Prescriptions  Medication Sig Dispense Refill  . albuterol (PROVENTIL HFA;VENTOLIN HFA) 108 (90 BASE) MCG/ACT inhaler Inhale 2 puffs into the lungs every 6 (six) hours as needed.    . ALPRAZolam (XANAX) 1 MG tablet Take 1 mg by mouth 4 (four) times daily.    Marland Kitchen azithromycin (ZITHROMAX) 250 MG tablet Take one tablet eachday 4 tablet 0  . Fluticasone-Salmeterol (ADVAIR DISKUS) 250-50 MCG/DOSE AEPB Inhale 1 puff into the lungs every 12 (twelve) hours. For shortness of breath    . furosemide (LASIX) 40 MG tablet Take 40 mg by mouth daily.    . insulin aspart protamine-insulin aspart (NOVOLOG 70/30) (70-30) 100 UNIT/ML injection Inject 15-25 Units into the skin 2 (two) times daily with a meal. 15units in the morning and 25 units at night    . olmesartan (BENICAR) 20 MG tablet Take 20 mg by mouth daily.     No current facility-administered medications for this visit.    REVIEW OF SYSTEMS:   Constitutional:  Denies fevers, chills or abnormal night sweats Eyes: Denies blurriness of vision, double vision or watery eyes Ears, nose, mouth, throat, and face: Denies mucositis or sore throat Respiratory: Denies cough, dyspnea or wheezes Cardiovascular: Denies palpitation, chest discomfort or lower extremity swelling Skin: Denies abnormal skin rashes Lymphatics: Denies new lymphadenopathy or easy bruising Neurological:Denies numbness, tingling or new weaknesses Behavioral/Psych: Mood is stable, no new changes  All other systems were reviewed with the  patient and are negative.  PHYSICAL EXAMINATION: ECOG PERFORMANCE STATUS: 1 - Symptomatic but completely ambulatory  Filed Vitals:   03/12/14 1349  BP: 147/78  Pulse: 83  Temp: 98.4 F (36.9 C)  Resp: 25   Filed Weights   03/12/14 1349  Weight: 238 lb (107.956 kg)    GENERAL:alert, no distress and comfortable. She is morbidly obese SKIN: skin color, texture, turgor are normal, no rashes or significant lesions EYES: normal, conjunctiva are pale and non-injected, sclera clear OROPHARYNX:no exudate, no erythema and lips, buccal mucosa, and tongue normal  NECK: supple, thyroid normal size, non-tender, without nodularity LYMPH:  no palpable lymphadenopathy in the cervical, axillary or inguinal LUNGS: clear to auscultation and percussion with normal breathing effort HEART: regular rate & rhythm and no murmurs and no lower extremity edema ABDOMEN:abdomen soft, non-tender and normal bowel sounds Musculoskeletal:no cyanosis of digits and no clubbing  PSYCH: alert & oriented x 3 with fluent speech NEURO: no focal motor/sensory deficits  LABORATORY DATA:  I have reviewed the data as listed Recent Results (from the past 2160 hour(s))  CBC & Diff and Retic     Status: Abnormal   Collection Time: 03/12/14  2:37 PM  Result Value Ref Range   WBC 9.1 3.9 - 10.3 10e3/uL   NEUT# 5.1 1.5 - 6.5 10e3/uL   HGB 8.8 (L) 11.6 - 15.9 g/dL   HCT 31.8 (L) 34.8 - 46.6 %   Platelets 381 145 - 400 10e3/uL   MCV 64.6 (L) 79.5 - 101.0 fL   MCH 17.9 (L) 25.1 - 34.0 pg   MCHC 27.7 (L) 31.5 - 36.0 g/dL   RBC 4.92 3.70 - 5.45 10e6/uL   RDW 19.3 (H) 11.2 - 14.5 %   lymph# 3.2 0.9 - 3.3 10e3/uL   MONO# 0.7 0.1 - 0.9 10e3/uL   Eosinophils Absolute 0.2 0.0 - 0.5 10e3/uL   Basophils Absolute 0.0 0.0 - 0.1 10e3/uL   NEUT% 55.7 38.4 - 76.8 %   LYMPH% 35.1 14.0 - 49.7 %   MONO% 7.1 0.0 - 14.0 %   EOS% 1.9 0.0 - 7.0 %   BASO% 0.2 0.0 - 2.0 %   nRBC 0 0 - 0 %   Retic % 1.74 0.70 - 2.10 %   Retic Ct Abs  85.61 33.70 - 90.70 10e3/uL   Immature Retic Fract 17.30 (H) 1.60 - 10.00 %  Ferritin     Status: Abnormal   Collection Time: 03/12/14  2:37 PM  Result Value Ref Range   Ferritin 7 (L) 9 - 269 ng/ml  Iron and TIBC     Status: Abnormal   Collection Time: 03/12/14  2:37 PM  Result Value Ref Range   Iron 20 (L) 41 - 142 ug/dL   TIBC 491 (H) 236 - 444 ug/dL   UIBC 471 (H) 120 - 384 ug/dL   %SAT 4 (L) 21 - 57 %  Sedimentation rate     Status: Abnormal   Collection Time: 03/12/14  2:37 PM  Result Value Ref Range  Sed Rate 22 (H) 0 - 20 mm/hr  Hold Tube, Blood Bank     Status: None   Collection Time: 03/12/14  2:37 PM  Result Value Ref Range   Hold Tube, Blood Bank Blood Bank Order Cancelled     RADIOGRAPHIC STUDIES: I have personally reviewed the radiological images as listed and agreed with the findings in the report. Mr Knee Right Wo Contrast  02/11/2014   CLINICAL DATA:  Chronic right knee pain for over a year. Right knee gives away causing her to fall.  EXAM: MRI OF THE RIGHT KNEE WITHOUT CONTRAST  TECHNIQUE: Multiplanar, multisequence MR imaging of the knee was performed. No intravenous contrast was administered.  COMPARISON:  None.  FINDINGS: MENISCI  Medial meniscus:  Intact.  Lateral meniscus:  Intact.  LIGAMENTS  Cruciates:  Intact ACL and PCL.  Collaterals: Medial collateral ligament is intact. Lateral collateral ligament complex is intact.  CARTILAGE  Patellofemoral: Small cartilage fissure involving the medial patellar facet with subchondral cystic changes and surrounding marrow edema.  Medial:  No focal chondral defect.  Lateral:  No focal chondral defect.  Joint: No significant joint effusion. No plical thickening. Normal Hoffa's fat.  Popliteal Fossa:  No Baker cyst.  Intact popliteus tendon.  Extensor Mechanism:  Intact.  Bones: There is diffuse homogeneous T1 hypo intense and T2 hyperintense marrow involving the distal femur and proximal tibia with relative sparing of the  epiphyses as can be seen with anemia versus smoking versus obesity versus a marrow infiltrative process. There is no fracture or dislocation.  IMPRESSION: 1. Small cartilage fissure involving the medial patellar facet with subchondral cystic changes and surrounding marrow edema. 2. There is diffuse homogeneous T1 hypointense and T2 hyperintense marrow involving the distal femur and proximal tibia with relative sparing of the epiphyses as can be seen with anemia versus smoking versus obesity versus a marrow infiltrative process.   Electronically Signed   By: Kathreen Devoid   On: 02/11/2014 11:48    ASSESSMENT & PLAN:  Iron deficiency anemia The most likely cause of her anemia is due to chronic blood loss. We discussed some of the risks, benefits, and alternatives of intravenous iron infusions. The patient is symptomatic from anemia and the iron level is critically low. She tolerated oral iron supplement poorly and desires to achieved higher levels of iron faster for adequate hematopoesis. Some of the side-effects to be expected including risks of infusion reactions, phlebitis, headaches, nausea and fatigue.  The patient is willing to proceed. Patient education material was dispensed.  Goal is to keep ferritin level greater than 50 Repeat stat hemoglobin showed that she does not need blood transfusion. I recommend the patient to start taking prenatal vitamin daily. I also told the patient to stop taking ibuprofen if possible. She needs to make an appointment to see her primary care provider to discuss management of heavy menorrhagia.   Knee pain, chronic She has chronic bilateral knee pain for a while. She's been taking ibuprofen excessively. I recommend the patient to refrain from taking ibuprofen if possible.   Abnormal MRI, knee I reviewed the MRI myself and felt that the changes seen is consistent with severe anemia.   Menorrhagia with irregular cycle I recommend she consult with PCP for  management of this. In the meantime, I will try to treat her severe iron deficiency anemia.     All questions were answered. The patient knows to call the clinic with any problems, questions or concerns. I spent  40 minutes counseling the patient face to face. The total time spent in the appointment was 55 minutes and more than 50% was on counseling.     [place], [place], MD 03/13/2014 9:41 AM

## 2014-03-22 ENCOUNTER — Ambulatory Visit (HOSPITAL_BASED_OUTPATIENT_CLINIC_OR_DEPARTMENT_OTHER): Payer: Medicaid Other

## 2014-03-22 DIAGNOSIS — D509 Iron deficiency anemia, unspecified: Secondary | ICD-10-CM

## 2014-03-22 MED ORDER — SODIUM CHLORIDE 0.9 % IV SOLN
Freq: Once | INTRAVENOUS | Status: AC
  Administered 2014-03-22: 12:00:00 via INTRAVENOUS

## 2014-03-22 MED ORDER — SODIUM CHLORIDE 0.9 % IV SOLN
510.0000 mg | Freq: Once | INTRAVENOUS | Status: AC
  Administered 2014-03-22: 510 mg via INTRAVENOUS
  Filled 2014-03-22: qty 17

## 2014-03-22 NOTE — Patient Instructions (Signed)

## 2014-03-29 ENCOUNTER — Ambulatory Visit (HOSPITAL_BASED_OUTPATIENT_CLINIC_OR_DEPARTMENT_OTHER): Payer: Medicaid Other

## 2014-03-29 DIAGNOSIS — D509 Iron deficiency anemia, unspecified: Secondary | ICD-10-CM

## 2014-03-29 MED ORDER — SODIUM CHLORIDE 0.9 % IV SOLN
510.0000 mg | Freq: Once | INTRAVENOUS | Status: AC
  Administered 2014-03-29: 510 mg via INTRAVENOUS
  Filled 2014-03-29: qty 17

## 2014-03-29 MED ORDER — SODIUM CHLORIDE 0.9 % IV SOLN
Freq: Once | INTRAVENOUS | Status: AC
  Administered 2014-03-29: 12:00:00 via INTRAVENOUS

## 2014-03-29 NOTE — Patient Instructions (Signed)

## 2014-03-29 NOTE — Progress Notes (Signed)
Patient saw PCP yesterday, c/o of abdominal pain.  PCP found blood in her stool and is setting her up with GI physician.  He also gave her hydrocodone for abdominal pain. It helped last night.  She did take her anxiety medicine this morning.

## 2014-04-26 ENCOUNTER — Other Ambulatory Visit (HOSPITAL_BASED_OUTPATIENT_CLINIC_OR_DEPARTMENT_OTHER): Payer: Medicaid Other

## 2014-04-26 DIAGNOSIS — D509 Iron deficiency anemia, unspecified: Secondary | ICD-10-CM | POA: Diagnosis present

## 2014-04-26 LAB — CBC & DIFF AND RETIC
BASO%: 0.2 % (ref 0.0–2.0)
BASOS ABS: 0 10*3/uL (ref 0.0–0.1)
EOS%: 1.1 % (ref 0.0–7.0)
Eosinophils Absolute: 0.1 10*3/uL (ref 0.0–0.5)
HEMATOCRIT: 42.4 % (ref 34.8–46.6)
HEMOGLOBIN: 12.8 g/dL (ref 11.6–15.9)
IMMATURE RETIC FRACT: 1.6 % (ref 1.60–10.00)
LYMPH%: 26.2 % (ref 14.0–49.7)
MCH: 23.1 pg — AB (ref 25.1–34.0)
MCHC: 30.2 g/dL — ABNORMAL LOW (ref 31.5–36.0)
MCV: 76.7 fL — ABNORMAL LOW (ref 79.5–101.0)
MONO#: 0.7 10*3/uL (ref 0.1–0.9)
MONO%: 7.1 % (ref 0.0–14.0)
NEUT#: 6.5 10*3/uL (ref 1.5–6.5)
NEUT%: 65.4 % (ref 38.4–76.8)
Platelets: 253 10*3/uL (ref 145–400)
RBC: 5.53 10*6/uL — ABNORMAL HIGH (ref 3.70–5.45)
RDW: 25.8 % — ABNORMAL HIGH (ref 11.2–14.5)
RETIC %: 0.68 % — AB (ref 0.70–2.10)
Retic Ct Abs: 37.6 10*3/uL (ref 33.70–90.70)
WBC: 10 10*3/uL (ref 3.9–10.3)
lymph#: 2.6 10*3/uL (ref 0.9–3.3)

## 2014-04-26 LAB — FERRITIN CHCC: Ferritin: 51 ng/ml (ref 9–269)

## 2014-04-29 ENCOUNTER — Telehealth: Payer: Self-pay | Admitting: *Deleted

## 2014-04-29 NOTE — Telephone Encounter (Signed)
-----   Message from Heath Lark, MD sent at 04/29/2014  8:22 AM EDT ----- Regarding: labs Pls let her know labs are great, fixed the anemia. I recommend reschedule another labs in 3 months and appt to see me with IV iron again. If OK, please let me know and I will cancel her appt this Friday and will place a new POF

## 2014-04-29 NOTE — Telephone Encounter (Signed)
Left message with note below. Asked her to call me back so we can set up appt.

## 2014-04-30 ENCOUNTER — Other Ambulatory Visit: Payer: Self-pay | Admitting: Hematology and Oncology

## 2014-04-30 ENCOUNTER — Telehealth: Payer: Self-pay | Admitting: Hematology and Oncology

## 2014-04-30 ENCOUNTER — Telehealth: Payer: Self-pay | Admitting: *Deleted

## 2014-04-30 DIAGNOSIS — D509 Iron deficiency anemia, unspecified: Secondary | ICD-10-CM

## 2014-04-30 NOTE — Telephone Encounter (Signed)
-----   Message from Heath Lark, MD sent at 04/29/2014  8:22 AM EDT ----- Regarding: labs Pls let her know labs are great, fixed the anemia. I recommend reschedule another labs in 3 months and appt to see me with IV iron again. If OK, please let me know and I will cancel her appt this Friday and will place a new POF

## 2014-04-30 NOTE — Telephone Encounter (Signed)
s.w. pt and she requested appts be mailed...amiled pt appt sched adn letter

## 2014-04-30 NOTE — Telephone Encounter (Signed)
Notified patient of message below. Is OK to come in 3 months for lab/MD/IV Iron.

## 2014-05-03 ENCOUNTER — Ambulatory Visit: Payer: Medicaid Other | Admitting: Hematology and Oncology

## 2014-07-12 ENCOUNTER — Other Ambulatory Visit (HOSPITAL_BASED_OUTPATIENT_CLINIC_OR_DEPARTMENT_OTHER): Payer: Medicaid Other

## 2014-07-12 DIAGNOSIS — D509 Iron deficiency anemia, unspecified: Secondary | ICD-10-CM | POA: Diagnosis not present

## 2014-07-12 LAB — CBC & DIFF AND RETIC
BASO%: 0.1 % (ref 0.0–2.0)
BASOS ABS: 0 10*3/uL (ref 0.0–0.1)
EOS%: 1 % (ref 0.0–7.0)
Eosinophils Absolute: 0.1 10*3/uL (ref 0.0–0.5)
HEMATOCRIT: 36.5 % (ref 34.8–46.6)
HEMOGLOBIN: 11.2 g/dL — AB (ref 11.6–15.9)
Immature Retic Fract: 12.3 % — ABNORMAL HIGH (ref 1.60–10.00)
LYMPH#: 2.4 10*3/uL (ref 0.9–3.3)
LYMPH%: 23.1 % (ref 14.0–49.7)
MCH: 24.8 pg — ABNORMAL LOW (ref 25.1–34.0)
MCHC: 30.7 g/dL — ABNORMAL LOW (ref 31.5–36.0)
MCV: 80.9 fL (ref 79.5–101.0)
MONO#: 0.8 10*3/uL (ref 0.1–0.9)
MONO%: 7.3 % (ref 0.0–14.0)
NEUT%: 68.5 % (ref 38.4–76.8)
NEUTROS ABS: 7.2 10*3/uL — AB (ref 1.5–6.5)
Platelets: 374 10*3/uL (ref 145–400)
RBC: 4.51 10*6/uL (ref 3.70–5.45)
RDW: 14.4 % (ref 11.2–14.5)
Retic %: 2.55 % — ABNORMAL HIGH (ref 0.70–2.10)
Retic Ct Abs: 115.01 10*3/uL — ABNORMAL HIGH (ref 33.70–90.70)
WBC: 10.5 10*3/uL — ABNORMAL HIGH (ref 3.9–10.3)

## 2014-07-12 LAB — IRON AND TIBC CHCC
%SAT: 3 % — ABNORMAL LOW (ref 21–57)
Iron: 14 ug/dL — ABNORMAL LOW (ref 41–142)
TIBC: 444 ug/dL (ref 236–444)
UIBC: 430 ug/dL — ABNORMAL HIGH (ref 120–384)

## 2014-07-12 LAB — FERRITIN CHCC: Ferritin: 10 ng/ml (ref 9–269)

## 2014-07-18 ENCOUNTER — Telehealth: Payer: Self-pay | Admitting: Hematology and Oncology

## 2014-07-18 NOTE — Telephone Encounter (Signed)
s.w pt and confirmed appt. °

## 2014-07-19 ENCOUNTER — Telehealth: Payer: Self-pay | Admitting: Hematology and Oncology

## 2014-07-19 ENCOUNTER — Ambulatory Visit (HOSPITAL_BASED_OUTPATIENT_CLINIC_OR_DEPARTMENT_OTHER): Payer: Medicaid Other | Admitting: Hematology and Oncology

## 2014-07-19 ENCOUNTER — Encounter: Payer: Self-pay | Admitting: Hematology and Oncology

## 2014-07-19 ENCOUNTER — Ambulatory Visit (HOSPITAL_BASED_OUTPATIENT_CLINIC_OR_DEPARTMENT_OTHER): Payer: Medicaid Other

## 2014-07-19 VITALS — BP 131/65 | HR 112 | Temp 98.0°F | Resp 19 | Ht 61.0 in | Wt 241.5 lb

## 2014-07-19 VITALS — BP 109/62 | HR 86 | Temp 98.0°F | Resp 19

## 2014-07-19 DIAGNOSIS — Z720 Tobacco use: Secondary | ICD-10-CM | POA: Diagnosis not present

## 2014-07-19 DIAGNOSIS — N921 Excessive and frequent menstruation with irregular cycle: Secondary | ICD-10-CM

## 2014-07-19 DIAGNOSIS — D509 Iron deficiency anemia, unspecified: Secondary | ICD-10-CM | POA: Diagnosis present

## 2014-07-19 MED ORDER — SODIUM CHLORIDE 0.9 % IV SOLN
Freq: Once | INTRAVENOUS | Status: AC
  Administered 2014-07-19: 11:00:00 via INTRAVENOUS

## 2014-07-19 MED ORDER — SODIUM CHLORIDE 0.9 % IV SOLN
510.0000 mg | Freq: Once | INTRAVENOUS | Status: AC
  Administered 2014-07-19: 510 mg via INTRAVENOUS
  Filled 2014-07-19: qty 17

## 2014-07-19 NOTE — Assessment & Plan Note (Signed)
I spent some time counseling the patient the importance of tobacco cessation. she is currently attempting to quit on her own 

## 2014-07-19 NOTE — Patient Instructions (Signed)

## 2014-07-19 NOTE — Progress Notes (Signed)
Pt did not want to stay full 30 mins observation. VSS, discharged ambulatory in no acute distress.

## 2014-07-19 NOTE — Telephone Encounter (Signed)
lvm for pt regarding to OCT appt.Marland KitchenMarland Kitchenpt will get sched nxt week

## 2014-07-19 NOTE — Assessment & Plan Note (Signed)
I recommend she consult with PCP for management of this. In the meantime, I will try to treat her severe iron deficiency anemia.

## 2014-07-19 NOTE — Assessment & Plan Note (Signed)
The most likely cause of her anemia is due to chronic blood loss. We discussed some of the risks, benefits, and alternatives of intravenous iron infusions. The patient is symptomatic from anemia and the iron level is critically low. She tolerated oral iron supplement poorly and desires to achieved higher levels of iron faster for adequate hematopoesis. Some of the side-effects to be expected including risks of infusion reactions, phlebitis, headaches, nausea and fatigue.  The patient is willing to proceed. Patient education material was dispensed.  Goal is to keep ferritin level greater than 50 I recommend the patient to continue taking prenatal vitamin daily. I also told the patient to stop taking ibuprofen if possible. She needs to make an appointment to see her primary care provider to discuss management of heavy menorrhagia. I plan to repeat her blood work again in 3 months. Until her menorrhagia issue resolve, she might be dependent on IV iron every few months to avoid blood transfusion.

## 2014-07-19 NOTE — Progress Notes (Signed)
[place] OFFICE PROGRESS NOTE  KILPATRICK Abran Cantor, MD SUMMARY OF HEMATOLOGIC HISTORY: Andrea Montgomery 39 y.o. female is here because of abnormal MRI changes. She have chronic knee pain for over 8 months. The right knee bother her more than the left knee she has been taking a lot of ibuprofen. The MRI suggested some bone marrow abnormality changes. I do not have a copy of her most recent blood work. Her last CBC from 2013 show hemoglobin of 10 with low MCV.  She denies recent chest pain on exertion, shortness of breath on minimal exertion, pre-syncopal episodes, or palpitations. However, she had profound fatigue and leg cramps. She had not noticed any recent bleeding such as epistaxis, hematuria or melena. She have heavy menstruation and she would bleed 10 days of her cycle of every 30 days with passage of large amount of clots. She also had regular hematochezia on a weekly basis after straining for bowel movement. She is not on antiplatelets agents. She never had colonoscopy She had no prior history or diagnosis of cancer. Her age appropriate screening programs are up-to-date. She eats a variety of diet. She has excessive pica with chewing of ice. She never donated blood or received blood transfusion The patient was never prescribed oral iron supplement. She has significant reflux on a regular basis. The patient was diagnosed with severe iron deficiency anemia and received 2 doses of IV iron in March. Repeat blood count in April 2016 show resolution of anemia. INTERVAL HISTORY: Andrea Montgomery 39 y.o. female returns for further follow-up. She continues to have heavy menstruation. She is attempting to quit smoking. She continues to use ibuprofen on a regular basis. She denies pica.  I have reviewed the past medical history, past surgical history, social history and family history with the patient and they are unchanged from previous note.  ALLERGIES:  has No Known  Allergies.  MEDICATIONS:  Current Outpatient Prescriptions  Medication Sig Dispense Refill  . albuterol (PROVENTIL HFA;VENTOLIN HFA) 108 (90 BASE) MCG/ACT inhaler Inhale 2 puffs into the lungs every 6 (six) hours as needed.    . ALPRAZolam (XANAX) 1 MG tablet Take 1 mg by mouth 4 (four) times daily.    . Fluticasone-Salmeterol (ADVAIR DISKUS) 250-50 MCG/DOSE AEPB Inhale 1 puff into the lungs every 12 (twelve) hours. For shortness of breath    . furosemide (LASIX) 40 MG tablet Take 40 mg by mouth daily.    Marland Kitchen HYDROcodone-acetaminophen (NORCO) 10-325 MG per tablet Take 1 tablet by mouth 4 (four) times daily as needed.  0  . insulin aspart protamine-insulin aspart (NOVOLOG 70/30) (70-30) 100 UNIT/ML injection Inject 15-25 Units into the skin 2 (two) times daily with a meal. 15units in the morning and 25 units at night    . lisinopril-hydrochlorothiazide (PRINZIDE,ZESTORETIC) 10-12.5 MG per tablet Take 1 tablet by mouth daily.  4   No current facility-administered medications for this visit.     REVIEW OF SYSTEMS:   Constitutional: Denies fevers, chills or night sweats Eyes: Denies blurriness of vision Ears, nose, mouth, throat, and face: Denies mucositis or sore throat Respiratory: Denies cough, dyspnea or wheezes Cardiovascular: Denies palpitation, chest discomfort or lower extremity swelling Gastrointestinal:  Denies nausea, heartburn or change in bowel habits Skin: Denies abnormal skin rashes Lymphatics: Denies new lymphadenopathy or easy bruising Neurological:Denies numbness, tingling or new weaknesses Behavioral/Psych: Mood is stable, no new changes  All other systems were reviewed with the patient and are negative.  PHYSICAL EXAMINATION: ECOG  PERFORMANCE STATUS: 0 - Asymptomatic  Filed Vitals:   07/19/14 1008  BP: 131/65  Pulse: 112  Temp: 98 F (36.7 C)  Resp: 19   Filed Weights   07/19/14 1008  Weight: 241 lb 8 oz (109.544 kg)    GENERAL:alert, no distress and  comfortable. She is morbidly obese SKIN: skin color, texture, turgor are normal, no rashes or significant lesions EYES: normal, Conjunctiva are pink and non-injected, sclera clear Musculoskeletal:no cyanosis of digits and no clubbing  NEURO: alert & oriented x 3 with fluent speech, no focal motor/sensory deficits  LABORATORY DATA:  I have reviewed the data as listed No results found for this or any previous visit (from the past 48 hour(s)).  Lab Results  Component Value Date   WBC 10.5* 07/12/2014   HGB 11.2* 07/12/2014   HCT 36.5 07/12/2014   MCV 80.9 07/12/2014   PLT 374 07/12/2014   ASSESSMENT & PLAN:  Iron deficiency anemia The most likely cause of her anemia is due to chronic blood loss. We discussed some of the risks, benefits, and alternatives of intravenous iron infusions. The patient is symptomatic from anemia and the iron level is critically low. She tolerated oral iron supplement poorly and desires to achieved higher levels of iron faster for adequate hematopoesis. Some of the side-effects to be expected including risks of infusion reactions, phlebitis, headaches, nausea and fatigue.  The patient is willing to proceed. Patient education material was dispensed.  Goal is to keep ferritin level greater than 50 I recommend the patient to continue taking prenatal vitamin daily. I also told the patient to stop taking ibuprofen if possible. She needs to make an appointment to see her primary care provider to discuss management of heavy menorrhagia. I plan to repeat her blood work again in 3 months. Until her menorrhagia issue resolve, she might be dependent on IV iron every few months to avoid blood transfusion.  Menorrhagia with irregular cycle I recommend she consult with PCP for management of this. In the meantime, I will try to treat her severe iron deficiency anemia.    Tobacco abuse I spent some time counseling the patient the importance of tobacco cessation. she is  currently attempting to quit on her own     All questions were answered. The patient knows to call the clinic with any problems, questions or concerns. No barriers to learning was detected.  I spent 20 minutes counseling the patient face to face. The total time spent in the appointment was 30 minutes and more than 50% was on counseling.     [facility], Tahani Potier, MD 7/15/201610:42 AM

## 2014-07-26 ENCOUNTER — Ambulatory Visit (HOSPITAL_BASED_OUTPATIENT_CLINIC_OR_DEPARTMENT_OTHER): Payer: Medicaid Other

## 2014-07-26 VITALS — BP 120/74 | HR 82 | Temp 99.0°F | Resp 20

## 2014-07-26 DIAGNOSIS — D509 Iron deficiency anemia, unspecified: Secondary | ICD-10-CM

## 2014-07-26 MED ORDER — SODIUM CHLORIDE 0.9 % IV SOLN
Freq: Once | INTRAVENOUS | Status: AC
  Administered 2014-07-26: 11:00:00 via INTRAVENOUS

## 2014-07-26 MED ORDER — SODIUM CHLORIDE 0.9 % IV SOLN
510.0000 mg | Freq: Once | INTRAVENOUS | Status: AC
  Administered 2014-07-26: 510 mg via INTRAVENOUS
  Filled 2014-07-26: qty 17

## 2014-07-26 NOTE — Patient Instructions (Signed)

## 2014-08-05 ENCOUNTER — Ambulatory Visit (INDEPENDENT_AMBULATORY_CARE_PROVIDER_SITE_OTHER): Payer: Medicaid Other | Admitting: Obstetrics

## 2014-08-05 ENCOUNTER — Encounter: Payer: Self-pay | Admitting: Obstetrics

## 2014-08-05 VITALS — BP 121/83 | HR 80 | Temp 97.8°F | Ht 62.0 in | Wt 242.2 lb

## 2014-08-05 DIAGNOSIS — N939 Abnormal uterine and vaginal bleeding, unspecified: Secondary | ICD-10-CM | POA: Diagnosis not present

## 2014-08-05 NOTE — Progress Notes (Signed)
Patient ID: Andrea Montgomery, female   DOB: 1975/08/12, 39 y.o.   MRN: [id number]  Chief Complaint  Patient presents with  . Menstrual Problem    new patient    HPI Andrea Montgomery is a 39 y.o. female.  Heavy prolonged periods with clotting.  H/O chronic anemia.  Wants to discuss Endometrial  Ablation.  Does not desitre future fertility. HPI  Past Medical History  Diagnosis Date  . Asthma   . COPD (chronic obstructive pulmonary disease)   . Diabetes mellitus   . Hypertension   . Iron deficiency anemia 03/12/2014  . Anxiety     History reviewed. No pertinent past surgical history.  Family History  Problem Relation Age of Onset  . Hypertension Mother   . Heart disease Mother     Social History History  Substance Use Topics  . Smoking status: Current Every Day Smoker  . Smokeless tobacco: Never Used  . Alcohol Use: 0.0 oz/week    0 Standard drinks or equivalent per week     Comment: occ    No Known Allergies  Current Outpatient Prescriptions  Medication Sig Dispense Refill  . albuterol (PROVENTIL HFA;VENTOLIN HFA) 108 (90 BASE) MCG/ACT inhaler Inhale 2 puffs into the lungs every 6 (six) hours as needed.    . ALPRAZolam (XANAX) 1 MG tablet Take 1 mg by mouth 4 (four) times daily.    . Fluticasone-Salmeterol (ADVAIR DISKUS) 250-50 MCG/DOSE AEPB Inhale 1 puff into the lungs every 12 (twelve) hours. For shortness of breath    . furosemide (LASIX) 40 MG tablet Take 40 mg by mouth daily.    Marland Kitchen HYDROcodone-acetaminophen (NORCO) 10-325 MG per tablet Take 1 tablet by mouth 4 (four) times daily as needed.  0  . insulin aspart protamine-insulin aspart (NOVOLOG 70/30) (70-30) 100 UNIT/ML injection Inject 15-25 Units into the skin 2 (two) times daily with a meal. 15units in the morning and 25 units at night    . lisinopril-hydrochlorothiazide (PRINZIDE,ZESTORETIC) 10-12.5 MG per tablet Take 1 tablet by mouth daily.  4   No current facility-administered medications for this visit.     Review of Systems Review of Systems Constitutional: negative for fatigue and weight loss Respiratory: negative for cough and wheezing Cardiovascular: negative for chest pain, fatigue and palpitations Gastrointestinal: negative for abdominal pain and change in bowel habits Genitourinary: heavy, prolonged and painful periods Integument/breast: negative for nipple discharge Musculoskeletal:negative for myalgias Neurological: negative for gait problems and tremors Behavioral/Psych: negative for abusive relationship, depression Endocrine: negative for temperature intolerance     Blood pressure 121/83, pulse 80, temperature 97.8 F (36.6 C), height 5\' 2"  (1.575 m), weight 242 lb 3.2 oz (109.861 kg), last menstrual period 07/26/2014.  Physical Exam Physical Exam:  Deferred General 100% of 10 min visit spent on counseling and coordination of care.   Data Reviewed Labs  Assessment     AUB-O.  Considering ablation.  H/O chronic iron deficiency anemia.    Plan  Ultrasound ordered Literature dispensed on ablation Proposed Endometrial Ablation / IUD Placement as treatment for this AUB. Prefer to Rx Provera for 30 days prior to procedure for optimal endometrial preparation F/U in 2 weeks   Orders Placed This Encounter  Procedures  . US Transvaginal Non-OB    Standing Status: Future     Number of Occurrences:      Standing Expiration Date: 10/05/2015    Order Specific Question:  Reason for Exam (SYMPTOM  OR DIAGNOSIS REQUIRED)    Answer:  AUB    Order Specific Question:  Preferred imaging location?    Answer:  [facility]  . US Pelvis Complete    Standing Status: Future     Number of Occurrences:      Standing Expiration Date: 10/05/2015    Order Specific Question:  Reason for Exam (SYMPTOM  OR DIAGNOSIS REQUIRED)    Answer:  AUB    Order Specific Question:  Preferred imaging location?    Answer:  [facility]   No orders of the defined types were placed in this  encounter.

## 2014-08-16 ENCOUNTER — Ambulatory Visit (HOSPITAL_COMMUNITY): Payer: Medicaid Other

## 2014-08-23 ENCOUNTER — Ambulatory Visit (HOSPITAL_COMMUNITY)
Admission: RE | Admit: 2014-08-23 | Discharge: 2014-08-23 | Disposition: A | Discharge status: Home / Self Care | Payer: Medicaid Other | Source: Ambulatory Visit | Attending: Obstetrics | Admitting: Obstetrics

## 2014-08-23 DIAGNOSIS — R938 Abnormal findings on diagnostic imaging of other specified body structures: Secondary | ICD-10-CM | POA: Diagnosis not present

## 2014-08-23 DIAGNOSIS — N939 Abnormal uterine and vaginal bleeding, unspecified: Secondary | ICD-10-CM

## 2014-08-23 DIAGNOSIS — N920 Excessive and frequent menstruation with regular cycle: Secondary | ICD-10-CM | POA: Diagnosis present

## 2014-08-26 ENCOUNTER — Ambulatory Visit: Payer: Medicaid Other | Admitting: Obstetrics

## 2014-08-27 ENCOUNTER — Ambulatory Visit (HOSPITAL_COMMUNITY): Payer: Medicaid Other

## 2014-08-28 ENCOUNTER — Ambulatory Visit: Payer: Medicaid Other | Admitting: Obstetrics

## 2014-09-11 ENCOUNTER — Encounter: Payer: Self-pay | Admitting: Obstetrics

## 2014-09-11 ENCOUNTER — Ambulatory Visit (INDEPENDENT_AMBULATORY_CARE_PROVIDER_SITE_OTHER): Payer: Medicaid Other | Admitting: Obstetrics

## 2014-09-11 VITALS — BP 137/73 | HR 94 | Wt 238.0 lb

## 2014-09-11 DIAGNOSIS — N946 Dysmenorrhea, unspecified: Secondary | ICD-10-CM

## 2014-09-11 DIAGNOSIS — N939 Abnormal uterine and vaginal bleeding, unspecified: Secondary | ICD-10-CM | POA: Diagnosis not present

## 2014-09-11 DIAGNOSIS — R9389 Abnormal findings on diagnostic imaging of other specified body structures: Secondary | ICD-10-CM

## 2014-09-11 DIAGNOSIS — R938 Abnormal findings on diagnostic imaging of other specified body structures: Secondary | ICD-10-CM

## 2014-09-11 MED ORDER — MEDROXYPROGESTERONE ACETATE 10 MG PO TABS: 20.0000 mg | ORAL_TABLET | Freq: Every day | ORAL | Status: DC

## 2014-09-11 NOTE — Progress Notes (Signed)
Patient ID: Andrea Montgomery, female   DOB: 1975/02/05, 39 y.o.   MRN: [id number]  Chief Complaint  Patient presents with  . Follow-up    HPI Andrea Montgomery is a 39 y.o. female.  H/O heavy and painful periods.  Presents today to discuss ultrasound results and management options.  Ultrasound reveals thickened endometrium, otherwise normal. HPI  Past Medical History  Diagnosis Date  . Asthma   . COPD (chronic obstructive pulmonary disease)   . Diabetes mellitus   . Hypertension   . Iron deficiency anemia 03/12/2014  . Anxiety     History reviewed. No pertinent past surgical history.  Family History  Problem Relation Age of Onset  . Hypertension Mother   . Heart disease Mother     Social History Social History  Substance Use Topics  . Smoking status: Current Every Day Smoker  . Smokeless tobacco: Never Used  . Alcohol Use: 0.0 oz/week    0 Standard drinks or equivalent per week     Comment: occ    No Known Allergies  Current Outpatient Prescriptions  Medication Sig Dispense Refill  . albuterol (PROVENTIL HFA;VENTOLIN HFA) 108 (90 BASE) MCG/ACT inhaler Inhale 2 puffs into the lungs every 6 (six) hours as needed.    . ALPRAZolam (XANAX) 1 MG tablet Take 1 mg by mouth 4 (four) times daily.    . Fluticasone-Salmeterol (ADVAIR DISKUS) 250-50 MCG/DOSE AEPB Inhale 1 puff into the lungs every 12 (twelve) hours. For shortness of breath    . furosemide (LASIX) 40 MG tablet Take 40 mg by mouth daily.    Marland Kitchen HYDROcodone-acetaminophen (NORCO) 10-325 MG per tablet Take 1 tablet by mouth 4 (four) times daily as needed.  0  . insulin aspart protamine-insulin aspart (NOVOLOG 70/30) (70-30) 100 UNIT/ML injection Inject 15-25 Units into the skin 2 (two) times daily with a meal. 15units in the morning and 25 units at night    . lisinopril-hydrochlorothiazide (PRINZIDE,ZESTORETIC) 10-12.5 MG per tablet Take 1 tablet by mouth daily.  4  . medroxyPROGESTERone (PROVERA) 10 MG tablet Take 2 tablets  (20 mg total) by mouth daily. 60 tablet 0   No current facility-administered medications for this visit.    Review of Systems Review of Systems Constitutional: negative for fatigue and weight loss Respiratory: negative for cough and wheezing Cardiovascular: negative for chest pain, fatigue and palpitations Gastrointestinal: negative for abdominal pain and change in bowel habits Genitourinary: positive for AUB and dysmenorrhea Integument/breast: negative for nipple discharge Musculoskeletal:negative for myalgias Neurological: negative for gait problems and tremors Behavioral/Psych: negative for abusive relationship, depression Endocrine: negative for temperature intolerance     Blood pressure 137/73, pulse 94, weight 238 lb (107.956 kg), last menstrual period 08/28/2014.  Physical Exam Physical Exam : Deferred  100% of 10 min visit spent on counseling and coordination of care.   Data Reviewed Labs Ultrasound  Assessment     AUB Dysmenorrhea     Plan    Endometrial Ablation with insertion of Mirena IUD recommended Provera Rx x 30 days for endometrial preparation for Ablation F/U in 6 weeks to schedule Ablation  No orders of the defined types were placed in this encounter.   Meds ordered this encounter  Medications  . medroxyPROGESTERone (PROVERA) 10 MG tablet    Sig: Take 2 tablets (20 mg total) by mouth daily.    Dispense:  60 tablet    Refill:  0

## 2014-09-24 ENCOUNTER — Other Ambulatory Visit: Payer: Self-pay | Admitting: Orthopedic Surgery

## 2014-09-24 ENCOUNTER — Telehealth: Payer: Self-pay | Admitting: *Deleted

## 2014-09-24 DIAGNOSIS — M25561 Pain in right knee: Secondary | ICD-10-CM

## 2014-09-24 NOTE — Telephone Encounter (Signed)
Patient is calling about a possible allergic reaction to the Provera. 12:02 No answer- LM on VM- Please call office -if emergent needs to call 911. Tried to call emergengy contact- no answer- did not leave message.

## 2014-09-24 NOTE — Telephone Encounter (Signed)
She should go to hospital if she calls back.  Stop Provera.

## 2014-09-24 NOTE — Telephone Encounter (Signed)
Patient states the Provera started her cycle early and cramping was severe. Patient had some chest pain. Patient is states the chest pain is gone - does she need to continue the Provera? Reviewed the action of the Provera with the patient- but can not advise her to continue if she thinks the chest pain is from the medication. Advised patient to call on call line- that Dr Jodi Mourning is on call and he would have to advise her regarding that.

## 2014-10-02 ENCOUNTER — Telehealth: Payer: Self-pay | Admitting: *Deleted

## 2014-10-02 NOTE — Telephone Encounter (Signed)
Having a lot of pain and passing clots. 6:25 Call to patient- patient state she has been through 3 packs of pads today and has been passing large clots. She did stop taking the provera as advised due to chest pain and that seen to help her symptoms. Advised patient that Dr Jodi Mourning had wanted her to go to [facility] for evaluation of her bleeding before and if she was bleeding heavier I advised that she go this time. She stated she would go.

## 2014-10-08 ENCOUNTER — Ambulatory Visit: Payer: Medicaid Other | Admitting: Obstetrics

## 2014-10-09 ENCOUNTER — Ambulatory Visit: Payer: Medicaid Other | Admitting: Obstetrics

## 2014-10-10 ENCOUNTER — Other Ambulatory Visit: Payer: Medicaid Other

## 2014-10-11 ENCOUNTER — Other Ambulatory Visit (HOSPITAL_BASED_OUTPATIENT_CLINIC_OR_DEPARTMENT_OTHER): Payer: Medicaid Other

## 2014-10-11 ENCOUNTER — Ambulatory Visit
Admission: RE | Admit: 2014-10-11 | Discharge: 2014-10-11 | Disposition: A | Discharge status: Home / Self Care | Payer: Medicaid Other | Source: Ambulatory Visit | Attending: Orthopedic Surgery | Admitting: Orthopedic Surgery

## 2014-10-11 DIAGNOSIS — M25561 Pain in right knee: Secondary | ICD-10-CM

## 2014-10-11 DIAGNOSIS — D509 Iron deficiency anemia, unspecified: Secondary | ICD-10-CM

## 2014-10-11 LAB — CBC & DIFF AND RETIC
BASO%: 0.2 % (ref 0.0–2.0)
BASOS ABS: 0 10*3/uL (ref 0.0–0.1)
EOS ABS: 0.2 10*3/uL (ref 0.0–0.5)
EOS%: 2.2 % (ref 0.0–7.0)
HEMATOCRIT: 36.8 % (ref 34.8–46.6)
HEMOGLOBIN: 11.5 g/dL — AB (ref 11.6–15.9)
IMMATURE RETIC FRACT: 8.4 % (ref 1.60–10.00)
LYMPH#: 2.7 10*3/uL (ref 0.9–3.3)
LYMPH%: 25.5 % (ref 14.0–49.7)
MCH: 26.7 pg (ref 25.1–34.0)
MCHC: 31.3 g/dL — ABNORMAL LOW (ref 31.5–36.0)
MCV: 85.6 fL (ref 79.5–101.0)
MONO#: 0.6 10*3/uL (ref 0.1–0.9)
MONO%: 5.9 % (ref 0.0–14.0)
NEUT#: 7.1 10*3/uL — ABNORMAL HIGH (ref 1.5–6.5)
NEUT%: 66.2 % (ref 38.4–76.8)
Platelets: 397 10*3/uL (ref 145–400)
RBC: 4.3 10*6/uL (ref 3.70–5.45)
RDW: 13.9 % (ref 11.2–14.5)
RETIC %: 3.26 % — AB (ref 0.70–2.10)
RETIC CT ABS: 140.18 10*3/uL — AB (ref 33.70–90.70)
WBC: 10.7 10*3/uL — ABNORMAL HIGH (ref 3.9–10.3)

## 2014-10-11 LAB — IRON AND TIBC CHCC
%SAT: 7 % — AB (ref 21–57)
IRON: 23 ug/dL — AB (ref 41–142)
TIBC: 360 ug/dL (ref 236–444)
UIBC: 337 ug/dL (ref 120–384)

## 2014-10-11 LAB — FERRITIN CHCC: Ferritin: 21 ng/ml (ref 9–269)

## 2014-10-17 ENCOUNTER — Ambulatory Visit (INDEPENDENT_AMBULATORY_CARE_PROVIDER_SITE_OTHER): Payer: Medicaid Other | Admitting: Obstetrics

## 2014-10-17 ENCOUNTER — Encounter: Payer: Self-pay | Admitting: Obstetrics

## 2014-10-17 VITALS — BP 140/91 | HR 80 | Temp 98.6°F | Ht 60.0 in | Wt 244.0 lb

## 2014-10-17 DIAGNOSIS — N946 Dysmenorrhea, unspecified: Secondary | ICD-10-CM

## 2014-10-17 DIAGNOSIS — R938 Abnormal findings on diagnostic imaging of other specified body structures: Secondary | ICD-10-CM | POA: Diagnosis not present

## 2014-10-17 DIAGNOSIS — N939 Abnormal uterine and vaginal bleeding, unspecified: Secondary | ICD-10-CM | POA: Diagnosis not present

## 2014-10-17 DIAGNOSIS — R9389 Abnormal findings on diagnostic imaging of other specified body structures: Secondary | ICD-10-CM

## 2014-10-17 NOTE — Progress Notes (Signed)
Patient ID: Andrea Montgomery, female   DOB: 03-04-1975, 39 y.o.   MRN: [id number]  Chief Complaint  Patient presents with  . Follow-up    HPI Andrea Montgomery is a 39 y.o. female.  H/O AUB and thickened endometrium on Ultrasound.  Finished 30 days of Provera.  Wants Ablation.  HPI  Past Medical History  Diagnosis Date  . Asthma   . COPD (chronic obstructive pulmonary disease) ([place])   . Diabetes mellitus   . Hypertension   . Iron deficiency anemia 03/12/2014  . Anxiety     History reviewed. No pertinent past surgical history.  Family History  Problem Relation Age of Onset  . Hypertension Mother   . Heart disease Mother     Social History Social History  Substance Use Topics  . Smoking status: Current Every Day Smoker  . Smokeless tobacco: Never Used  . Alcohol Use: 0.0 oz/week    0 Standard drinks or equivalent per week     Comment: occ    No Known Allergies  Current Outpatient Prescriptions  Medication Sig Dispense Refill  . albuterol (PROVENTIL HFA;VENTOLIN HFA) 108 (90 BASE) MCG/ACT inhaler Inhale 2 puffs into the lungs every 6 (six) hours as needed.    . ALPRAZolam (XANAX) 1 MG tablet Take 1 mg by mouth 4 (four) times daily.    . Fluticasone-Salmeterol (ADVAIR DISKUS) 250-50 MCG/DOSE AEPB Inhale 1 puff into the lungs every 12 (twelve) hours. For shortness of breath    . furosemide (LASIX) 40 MG tablet Take 40 mg by mouth daily.    Marland Kitchen HYDROcodone-acetaminophen (NORCO) 10-325 MG per tablet Take 1 tablet by mouth 4 (four) times daily as needed.  0  . insulin aspart protamine-insulin aspart (NOVOLOG 70/30) (70-30) 100 UNIT/ML injection Inject 15-25 Units into the skin 2 (two) times daily with a meal. 15units in the morning and 25 units at night    . lisinopril-hydrochlorothiazide (PRINZIDE,ZESTORETIC) 10-12.5 MG per tablet Take 1 tablet by mouth daily.  4  . medroxyPROGESTERone (PROVERA) 10 MG tablet Take 2 tablets (20 mg total) by mouth daily. 60 tablet 0   No current  facility-administered medications for this visit.    Review of Systems Review of Systems Constitutional: negative for fatigue and weight loss Respiratory: negative for cough and wheezing Cardiovascular: negative for chest pain, fatigue and palpitations Gastrointestinal: negative for abdominal pain and change in bowel habits Genitourinary:negative Integument/breast: negative for nipple discharge Musculoskeletal:negative for myalgias Neurological: negative for gait problems and tremors Behavioral/Psych: negative for abusive relationship, depression Endocrine: negative for temperature intolerance     Blood pressure 140/91, pulse 80, temperature 98.6 F (37 C), height 5' (1.524 m), weight 244 lb (110.678 kg), last menstrual period 09/21/2014.  Physical Exam Physical Exam   Deferred  100% of 10 min visit spent on counseling and coordination of care.   Data Reviewed Ultrasound  Assessment     AUB     Plan    Endometrial Ablation scheduled. F/U 2 weeks post op   No orders of the defined types were placed in this encounter.   No orders of the defined types were placed in this encounter.

## 2014-10-18 ENCOUNTER — Ambulatory Visit (HOSPITAL_BASED_OUTPATIENT_CLINIC_OR_DEPARTMENT_OTHER): Payer: Medicaid Other

## 2014-10-18 ENCOUNTER — Telehealth: Payer: Self-pay | Admitting: *Deleted

## 2014-10-18 ENCOUNTER — Telehealth: Payer: Self-pay | Admitting: Hematology and Oncology

## 2014-10-18 ENCOUNTER — Ambulatory Visit (HOSPITAL_BASED_OUTPATIENT_CLINIC_OR_DEPARTMENT_OTHER): Payer: Medicaid Other | Admitting: Hematology and Oncology

## 2014-10-18 ENCOUNTER — Encounter: Payer: Self-pay | Admitting: Hematology and Oncology

## 2014-10-18 VITALS — BP 132/85 | HR 88 | Temp 98.3°F | Resp 20 | Ht 60.0 in | Wt 242.5 lb

## 2014-10-18 DIAGNOSIS — Z720 Tobacco use: Secondary | ICD-10-CM | POA: Diagnosis not present

## 2014-10-18 DIAGNOSIS — M25562 Pain in left knee: Secondary | ICD-10-CM

## 2014-10-18 DIAGNOSIS — N921 Excessive and frequent menstruation with irregular cycle: Secondary | ICD-10-CM

## 2014-10-18 DIAGNOSIS — D509 Iron deficiency anemia, unspecified: Secondary | ICD-10-CM

## 2014-10-18 MED ORDER — SODIUM CHLORIDE 0.9 % IV SOLN
510.0000 mg | Freq: Once | INTRAVENOUS | Status: AC
  Administered 2014-10-18: 510 mg via INTRAVENOUS
  Filled 2014-10-18: qty 17

## 2014-10-18 MED ORDER — SODIUM CHLORIDE 0.9 % IV SOLN
INTRAVENOUS | Status: DC
  Administered 2014-10-18: 12:00:00 via INTRAVENOUS

## 2014-10-18 NOTE — Telephone Encounter (Signed)
per pof to sch pt appt-gave pt copy of avs-sent MW email to sch trmt-adv pt to get updated sch 10/21

## 2014-10-18 NOTE — Assessment & Plan Note (Signed)
She has met with a gynecologist with plan for uterine ablation in the near future.

## 2014-10-18 NOTE — Telephone Encounter (Signed)
Per staff message and POF I have scheduled appts. Advised scheduler of appts. JMW  

## 2014-10-18 NOTE — Assessment & Plan Note (Signed)
I spent some time counseling the patient the importance of tobacco cessation. she is currently attempting to quit on her own 

## 2014-10-18 NOTE — Patient Instructions (Signed)

## 2014-10-18 NOTE — Assessment & Plan Note (Signed)
The most likely cause of her anemia is due to chronic blood loss. We discussed some of the risks, benefits, and alternatives of intravenous iron infusions. The patient is symptomatic from anemia and the iron level is critically low. She tolerated oral iron supplement poorly and desires to achieved higher levels of iron faster for adequate hematopoesis. Some of the side-effects to be expected including risks of infusion reactions, phlebitis, headaches, nausea and fatigue.  The patient is willing to proceed. Patient education material was dispensed.  Goal is to keep ferritin level greater than 50

## 2014-10-18 NOTE — Progress Notes (Signed)
[name] OFFICE PROGRESS NOTE  KILPATRICK JR,GEORGE R, MD SUMMARY OF HEMATOLOGIC HISTORY:  She have chronic knee pain for over 8 months. The right knee bother her more than the left knee she has been taking a lot of ibuprofen. The MRI suggested some bone marrow abnormality changes. I do not have a copy of her most recent blood work. Her last CBC from 2013 show hemoglobin of 10 with low MCV.  She denies recent chest pain on exertion, shortness of breath on minimal exertion, pre-syncopal episodes, or palpitations. However, she had profound fatigue and leg cramps. She had not noticed any recent bleeding such as epistaxis, hematuria or melena. She have heavy menstruation and she would bleed 10 days of her cycle of every 30 days with passage of large amount of clots. She has received multiple doses of IV iron in 2016 frying deficiency anemia INTERVAL HISTORY: Andrea Montgomery 39 y.o. female returns for further follow-up. She continues to take ibuprofen regularly for knee pain. She has significant irregular menstruation with plan for uterine ablation in the new future. She complains of fatigue and chronic restlessly syndrome The patient denies any recent signs or symptoms of bleeding such as spontaneous epistaxis, hematuria or hematochezia.   I have reviewed the past medical history, past surgical history, social history and family history with the patient and they are unchanged from previous note.  ALLERGIES:  has No Known Allergies.  MEDICATIONS:  Current Outpatient Prescriptions  Medication Sig Dispense Refill  . albuterol (PROVENTIL HFA;VENTOLIN HFA) 108 (90 BASE) MCG/ACT inhaler Inhale 2 puffs into the lungs every 6 (six) hours as needed for wheezing or shortness of breath.     . ALPRAZolam (XANAX) 1 MG tablet Take 1 mg by mouth 4 (four) times daily.    . Fluticasone-Salmeterol (ADVAIR DISKUS) 250-50 MCG/DOSE AEPB Inhale 1 puff into the lungs every 12 (twelve) hours. For  shortness of breath    . furosemide (LASIX) 40 MG tablet Take 40 mg by mouth daily.    Marland Kitchen HYDROcodone-acetaminophen (NORCO) 10-325 MG per tablet Take 1 tablet by mouth 4 (four) times daily as needed.  0  . insulin aspart protamine-insulin aspart (NOVOLOG 70/30) (70-30) 100 UNIT/ML injection Inject 15-25 Units into the skin 2 (two) times daily with a meal. 15units in the morning and 25 units at night    . lisinopril-hydrochlorothiazide (PRINZIDE,ZESTORETIC) 10-12.5 MG per tablet Take 1 tablet by mouth daily.  4  . medroxyPROGESTERone (PROVERA) 10 MG tablet Take 2 tablets (20 mg total) by mouth daily. 60 tablet 0   No current facility-administered medications for this visit.   Facility-Administered Medications Ordered in Other Visits  Medication Dose Route Frequency Provider Last Rate Last Dose  . 0.9 %  sodium chloride infusion   Intravenous Continuous Heath Lark, MD   Stopped at 10/18/14 1207     REVIEW OF SYSTEMS:   Constitutional: Denies fevers, chills or night sweats Eyes: Denies blurriness of vision Ears, nose, mouth, throat, and face: Denies mucositis or sore throat Respiratory: Denies cough, dyspnea or wheezes Cardiovascular: Denies palpitation, chest discomfort or lower extremity swelling Gastrointestinal:  Denies nausea, heartburn or change in bowel habits Skin: Denies abnormal skin rashes Lymphatics: Denies new lymphadenopathy or easy bruising Neurological:Denies numbness, tingling or new weaknesses Behavioral/Psych: Mood is stable, no new changes  All other systems were reviewed with the patient and are negative.  PHYSICAL EXAMINATION: ECOG PERFORMANCE STATUS: 1 - Symptomatic but completely ambulatory  Filed Vitals:   10/18/14 1031  BP: 132/85  Pulse: 88  Temp: 98.3 F (36.8 C)  Resp: 20   Filed Weights   10/18/14 1031  Weight: 242 lb 8 oz (109.997 kg)    GENERAL:alert, no distress and comfortable SKIN: skin color, texture, turgor are normal, no rashes or  significant lesions EYES: normal, Conjunctiva are pink and non-injected, sclera clear Musculoskeletal:no cyanosis of digits and no clubbing  NEURO: alert & oriented x 3 with fluent speech, no focal motor/sensory deficits  LABORATORY DATA:  I have reviewed the data as listed No results found for this or any previous visit (from the past 48 hour(s)).  Lab Results  Component Value Date   WBC 10.7* 10/11/2014   HGB 11.5* 10/11/2014   HCT 36.8 10/11/2014   MCV 85.6 10/11/2014   PLT 397 10/11/2014    ASSESSMENT & PLAN:  Iron deficiency anemia The most likely cause of her anemia is due to chronic blood loss. We discussed some of the risks, benefits, and alternatives of intravenous iron infusions. The patient is symptomatic from anemia and the iron level is critically low. She tolerated oral iron supplement poorly and desires to achieved higher levels of iron faster for adequate hematopoesis. Some of the side-effects to be expected including risks of infusion reactions, phlebitis, headaches, nausea and fatigue.  The patient is willing to proceed. Patient education material was dispensed.  Goal is to keep ferritin level greater than 50   Menorrhagia with irregular cycle She has met with a gynecologist with plan for uterine ablation in the near future.   Tobacco abuse I spent some time counseling the patient the importance of tobacco cessation. she is currently attempting to quit on her own   All questions were answered. The patient knows to call the clinic with any problems, questions or concerns. No barriers to learning was detected.  I spent 15 minutes counseling the patient face to face. The total time spent in the appointment was 25 minutes and more than 50% was on counseling.     [facility], Margee Trentham, MD 10/14/201612:42 PM

## 2014-10-22 ENCOUNTER — Other Ambulatory Visit: Payer: Self-pay | Admitting: *Deleted

## 2014-10-23 NOTE — Patient Instructions (Signed)
Your procedure is scheduled on:  Friday, October 25, 2014  Enter through the Main Entrance of [facility] at: 7:15 a.m.  Pick up the phone at the desk and dial [phone].  Call this number if you have problems the morning of surgery: [phone].  Remember: Do NOT eat food or drink after: MIDNIGHT TONIGHT Take these medicines the morning of surgery with a SIP OF WATER: LISINOPRIL, XANAX IF NEEDED  *TAKE 1/2 (HALF) INSULIN DOSE AT BEDTIME NIGHT BEFORE SURGERY  *BRING ASTHMA INHALERS DAY OF SURGERY  Do NOT wear jewelry (body piercing), metal hair clips/bobby pins, make-up, or nail polish. Do NOT wear lotions, powders, or perfumes.  You may wear deoderant. Do NOT shave for 48 hours prior to surgery. Do NOT bring valuables to the hospital. Contacts, dentures, or bridgework may not be worn into surgery.  Have a responsible adult drive you home and stay with you for 24 hours after your procedure

## 2014-10-24 ENCOUNTER — Other Ambulatory Visit: Payer: Self-pay | Admitting: Obstetrics

## 2014-10-24 ENCOUNTER — Encounter (HOSPITAL_COMMUNITY)
Admission: RE | Admit: 2014-10-24 | Discharge: 2014-10-24 | Disposition: A | Discharge status: Home / Self Care | Payer: Medicaid Other | Source: Ambulatory Visit | Attending: Obstetrics | Admitting: Obstetrics

## 2014-10-24 ENCOUNTER — Encounter (HOSPITAL_COMMUNITY): Payer: Self-pay

## 2014-10-24 ENCOUNTER — Telehealth: Payer: Self-pay | Admitting: Hematology and Oncology

## 2014-10-24 DIAGNOSIS — Z01818 Encounter for other preprocedural examination: Secondary | ICD-10-CM | POA: Insufficient documentation

## 2014-10-24 DIAGNOSIS — N939 Abnormal uterine and vaginal bleeding, unspecified: Secondary | ICD-10-CM

## 2014-10-24 HISTORY — DX: Emphysema, unspecified: J43.9

## 2014-10-24 LAB — BASIC METABOLIC PANEL
Anion gap: 5 (ref 5–15)
BUN: 12 mg/dL (ref 6–20)
CALCIUM: 9 mg/dL (ref 8.9–10.3)
CO2: 28 mmol/L (ref 22–32)
CREATININE: 0.78 mg/dL (ref 0.44–1.00)
Chloride: 103 mmol/L (ref 101–111)
GFR calc Af Amer: >60 mL/min (ref >60)
GLUCOSE: 197 mg/dL — AB (ref 65–99)
Potassium: 4 mmol/L (ref 3.5–5.1)
Sodium: 136 mmol/L (ref 135–145)

## 2014-10-24 LAB — CBC
HEMATOCRIT: 37.4 % (ref 36.0–46.0)
Hemoglobin: 11.7 g/dL — ABNORMAL LOW (ref 12.0–15.0)
MCH: 27.1 pg (ref 26.0–34.0)
MCHC: 31.3 g/dL (ref 30.0–36.0)
MCV: 86.8 fL (ref 78.0–100.0)
PLATELETS: 301 10*3/uL (ref 150–400)
RBC: 4.31 MIL/uL (ref 3.87–5.11)
RDW: 14.7 % (ref 11.5–15.5)
WBC: 11.5 10*3/uL — ABNORMAL HIGH (ref 4.0–10.5)

## 2014-10-24 MED ORDER — MISOPROSTOL 200 MCG PO TABS: ORAL_TABLET | ORAL | Status: DC

## 2014-10-24 NOTE — Telephone Encounter (Signed)
pt called to r/s appt due to surgery done...pt aware of new d.t

## 2014-10-25 ENCOUNTER — Ambulatory Visit (HOSPITAL_COMMUNITY)
Admission: RE | Admit: 2014-10-25 | Discharge: 2014-10-25 | Disposition: A | Discharge status: Home / Self Care | Payer: Medicaid Other | Source: Ambulatory Visit | Attending: Obstetrics | Admitting: Obstetrics

## 2014-10-25 ENCOUNTER — Encounter (HOSPITAL_COMMUNITY): Payer: Self-pay

## 2014-10-25 ENCOUNTER — Encounter (HOSPITAL_COMMUNITY)
Admission: RE | Disposition: A | Discharge status: Home / Self Care | Payer: Self-pay | Source: Ambulatory Visit | Attending: Obstetrics

## 2014-10-25 ENCOUNTER — Ambulatory Visit (HOSPITAL_COMMUNITY): Payer: Medicaid Other | Admitting: Anesthesiology

## 2014-10-25 ENCOUNTER — Other Ambulatory Visit: Payer: Self-pay | Admitting: Obstetrics

## 2014-10-25 ENCOUNTER — Ambulatory Visit: Payer: Medicaid Other

## 2014-10-25 DIAGNOSIS — E119 Type 2 diabetes mellitus without complications: Secondary | ICD-10-CM | POA: Diagnosis not present

## 2014-10-25 DIAGNOSIS — I10 Essential (primary) hypertension: Secondary | ICD-10-CM | POA: Insufficient documentation

## 2014-10-25 DIAGNOSIS — N946 Dysmenorrhea, unspecified: Secondary | ICD-10-CM | POA: Diagnosis not present

## 2014-10-25 DIAGNOSIS — Z30430 Encounter for insertion of intrauterine contraceptive device: Secondary | ICD-10-CM | POA: Insufficient documentation

## 2014-10-25 DIAGNOSIS — N938 Other specified abnormal uterine and vaginal bleeding: Secondary | ICD-10-CM | POA: Diagnosis present

## 2014-10-25 DIAGNOSIS — Z794 Long term (current) use of insulin: Secondary | ICD-10-CM | POA: Insufficient documentation

## 2014-10-25 DIAGNOSIS — G8918 Other acute postprocedural pain: Secondary | ICD-10-CM

## 2014-10-25 DIAGNOSIS — N921 Excessive and frequent menstruation with irregular cycle: Secondary | ICD-10-CM | POA: Diagnosis not present

## 2014-10-25 DIAGNOSIS — F17200 Nicotine dependence, unspecified, uncomplicated: Secondary | ICD-10-CM | POA: Insufficient documentation

## 2014-10-25 HISTORY — PX: DILITATION & CURRETTAGE/HYSTROSCOPY WITH HYDROTHERMAL ABLATION: SHX5570

## 2014-10-25 LAB — GLUCOSE, CAPILLARY
GLUCOSE-CAPILLARY: 195 mg/dL — AB (ref 65–99)
Glucose-Capillary: 264 mg/dL — ABNORMAL HIGH (ref 65–99)

## 2014-10-25 LAB — PREGNANCY, URINE: PREG TEST UR: NEGATIVE

## 2014-10-25 SURGERY — DILATATION & CURETTAGE/HYSTEROSCOPY WITH HYDROTHERMAL ABLATION
Anesthesia: General | Site: Vagina

## 2014-10-25 MED ORDER — METOCLOPRAMIDE HCL 5 MG/ML IJ SOLN: 10.0000 mg | Freq: Once | INTRAMUSCULAR | Status: DC | PRN

## 2014-10-25 MED ORDER — SODIUM BICARBONATE 8.4 % IV SOLN
INTRAVENOUS | Status: AC
  Filled 2014-10-25: qty 50

## 2014-10-25 MED ORDER — FENTANYL CITRATE (PF) 100 MCG/2ML IJ SOLN
25.0000 ug | INTRAMUSCULAR | Status: DC | PRN
  Administered 2014-10-25: 25 ug via INTRAVENOUS
  Administered 2014-10-25 (×2): 50 ug via INTRAVENOUS

## 2014-10-25 MED ORDER — SUCCINYLCHOLINE CHLORIDE 20 MG/ML IJ SOLN
INTRAMUSCULAR | Status: AC
  Filled 2014-10-25: qty 1

## 2014-10-25 MED ORDER — MIDAZOLAM HCL 2 MG/2ML IJ SOLN
INTRAMUSCULAR | Status: AC
  Filled 2014-10-25: qty 4

## 2014-10-25 MED ORDER — OXYCODONE HCL 10 MG PO TABS: 10.0000 mg | ORAL_TABLET | Freq: Four times a day (QID) | ORAL | Status: DC | PRN

## 2014-10-25 MED ORDER — MIDAZOLAM HCL 2 MG/2ML IJ SOLN
INTRAMUSCULAR | Status: DC | PRN
  Administered 2014-10-25: 1 mg via INTRAVENOUS

## 2014-10-25 MED ORDER — FENTANYL CITRATE (PF) 100 MCG/2ML IJ SOLN
INTRAMUSCULAR | Status: AC
  Filled 2014-10-25: qty 4

## 2014-10-25 MED ORDER — SODIUM CHLORIDE 0.9 % IJ SOLN: 3.0000 mL | INTRAMUSCULAR | Status: DC | PRN

## 2014-10-25 MED ORDER — OXYCODONE-ACETAMINOPHEN 10-325 MG PO TABS: 1.0000 | ORAL_TABLET | ORAL | Status: DC | PRN

## 2014-10-25 MED ORDER — IBUPROFEN 800 MG PO TABS: 800.0000 mg | ORAL_TABLET | Freq: Three times a day (TID) | ORAL | Status: DC | PRN

## 2014-10-25 MED ORDER — OXYCODONE HCL 5 MG PO TABS: 5.0000 mg | ORAL_TABLET | ORAL | Status: DC | PRN

## 2014-10-25 MED ORDER — OXYCODONE HCL 5 MG PO TABS
ORAL_TABLET | ORAL | Status: AC
  Filled 2014-10-25: qty 2

## 2014-10-25 MED ORDER — FENTANYL CITRATE (PF) 100 MCG/2ML IJ SOLN: 25.0000 ug | INTRAMUSCULAR | Status: DC | PRN

## 2014-10-25 MED ORDER — SUCCINYLCHOLINE CHLORIDE 200 MG/10ML IV SOSY
PREFILLED_SYRINGE | INTRAVENOUS | Status: DC | PRN
  Administered 2014-10-25: 140 mg via INTRAVENOUS

## 2014-10-25 MED ORDER — MEPERIDINE HCL 25 MG/ML IJ SOLN: 6.2500 mg | INTRAMUSCULAR | Status: DC | PRN

## 2014-10-25 MED ORDER — HYDROCODONE-ACETAMINOPHEN 7.5-325 MG PO TABS
1.0000 | ORAL_TABLET | Freq: Once | ORAL | Status: AC | PRN
  Administered 2014-10-25: 1 via ORAL

## 2014-10-25 MED ORDER — AMMONIA AROMATIC IN INHA
RESPIRATORY_TRACT | Status: AC
  Filled 2014-10-25: qty 10

## 2014-10-25 MED ORDER — LACTATED RINGERS IV SOLN
INTRAVENOUS | Status: DC
  Administered 2014-10-25: 08:00:00 via INTRAVENOUS
  Administered 2014-10-25: 1000 mL via INTRAVENOUS

## 2014-10-25 MED ORDER — HYDROCODONE-ACETAMINOPHEN 7.5-325 MG PO TABS
ORAL_TABLET | ORAL | Status: AC
  Filled 2014-10-25: qty 1

## 2014-10-25 MED ORDER — FENTANYL CITRATE (PF) 100 MCG/2ML IJ SOLN
INTRAMUSCULAR | Status: AC
  Filled 2014-10-25: qty 2

## 2014-10-25 MED ORDER — LIDOCAINE HCL 1 % IJ SOLN
INTRAMUSCULAR | Status: AC
  Filled 2014-10-25: qty 20

## 2014-10-25 MED ORDER — ONDANSETRON HCL 4 MG/2ML IJ SOLN
INTRAMUSCULAR | Status: AC
  Filled 2014-10-25: qty 2

## 2014-10-25 MED ORDER — LIDOCAINE HCL (CARDIAC) 20 MG/ML IV SOLN
INTRAVENOUS | Status: AC
  Filled 2014-10-25: qty 5

## 2014-10-25 MED ORDER — PROPOFOL 10 MG/ML IV BOLUS
INTRAVENOUS | Status: DC | PRN
  Administered 2014-10-25: 200 mg via INTRAVENOUS
  Administered 2014-10-25: 100 mg via INTRAVENOUS
  Administered 2014-10-25: 180 mg via INTRAVENOUS

## 2014-10-25 MED ORDER — PROPOFOL 10 MG/ML IV BOLUS
INTRAVENOUS | Status: AC
  Filled 2014-10-25: qty 20

## 2014-10-25 MED ORDER — SODIUM CHLORIDE 0.9 % IV SOLN: 250.0000 mL | INTRAVENOUS | Status: DC | PRN

## 2014-10-25 MED ORDER — LIDOCAINE HCL 1 % IJ SOLN
INTRAMUSCULAR | Status: DC | PRN
  Administered 2014-10-25: 22 mL

## 2014-10-25 MED ORDER — FENTANYL CITRATE (PF) 100 MCG/2ML IJ SOLN
INTRAMUSCULAR | Status: DC | PRN
  Administered 2014-10-25 (×2): 50 ug via INTRAVENOUS

## 2014-10-25 MED ORDER — KETOROLAC TROMETHAMINE 30 MG/ML IJ SOLN
INTRAMUSCULAR | Status: DC | PRN
  Administered 2014-10-25: 30 mg via INTRAVENOUS

## 2014-10-25 MED ORDER — DEXAMETHASONE SODIUM PHOSPHATE 4 MG/ML IJ SOLN
INTRAMUSCULAR | Status: AC
  Filled 2014-10-25: qty 1

## 2014-10-25 MED ORDER — KETOROLAC TROMETHAMINE 30 MG/ML IJ SOLN
INTRAMUSCULAR | Status: AC
  Filled 2014-10-25: qty 1

## 2014-10-25 MED ORDER — ONDANSETRON HCL 4 MG/2ML IJ SOLN
INTRAMUSCULAR | Status: DC | PRN
  Administered 2014-10-25: 4 mg via INTRAVENOUS

## 2014-10-25 MED ORDER — OXYCODONE HCL 5 MG PO TABS
10.0000 mg | ORAL_TABLET | Freq: Once | ORAL | Status: AC
  Administered 2014-10-25: 10 mg via ORAL

## 2014-10-25 MED ORDER — FLUMAZENIL 0.5 MG/5ML IV SOLN
INTRAVENOUS | Status: DC | PRN
  Administered 2014-10-25: 0.2 mg via INTRAVENOUS

## 2014-10-25 MED ORDER — HYDROCODONE-ACETAMINOPHEN 5-325 MG PO TABS
ORAL_TABLET | ORAL | Status: AC
  Filled 2014-10-25: qty 1

## 2014-10-25 MED ORDER — SCOPOLAMINE 1 MG/3DAYS TD PT72
MEDICATED_PATCH | TRANSDERMAL | Status: DC
Start: 2014-10-25 — End: 2014-10-25
  Administered 2014-10-25: 1.5 mg via TRANSDERMAL
  Filled 2014-10-25: qty 1

## 2014-10-25 MED ORDER — SCOPOLAMINE 1 MG/3DAYS TD PT72
1.0000 | MEDICATED_PATCH | Freq: Once | TRANSDERMAL | Status: DC
  Administered 2014-10-25: 1.5 mg via TRANSDERMAL

## 2014-10-25 MED ORDER — SODIUM CHLORIDE 0.9 % IJ SOLN: 3.0000 mL | Freq: Two times a day (BID) | INTRAMUSCULAR | Status: DC

## 2014-10-25 MED ORDER — LIDOCAINE HCL (CARDIAC) 20 MG/ML IV SOLN
INTRAVENOUS | Status: DC | PRN
  Administered 2014-10-25: 70 mg via INTRAVENOUS

## 2014-10-25 MED ORDER — ACETAMINOPHEN 325 MG PO TABS: 650.0000 mg | ORAL_TABLET | ORAL | Status: DC | PRN

## 2014-10-25 MED ORDER — ACETAMINOPHEN 650 MG RE SUPP
650.0000 mg | RECTAL | Status: DC | PRN
  Filled 2014-10-25: qty 1

## 2014-10-25 MED ORDER — DEXAMETHASONE SODIUM PHOSPHATE 10 MG/ML IJ SOLN
INTRAMUSCULAR | Status: DC | PRN
  Administered 2014-10-25: 4 mg via INTRAVENOUS

## 2014-10-25 MED ORDER — SODIUM CHLORIDE 0.9 % IR SOLN
Status: DC | PRN
  Administered 2014-10-25: 3000 mL

## 2014-10-25 SURGICAL SUPPLY — 13 items
CATH ROBINSON RED A/P 16FR (CATHETERS) ×3 IMPLANT
CLOTH BEACON ORANGE TIMEOUT ST (SAFETY) ×4 IMPLANT
CONTAINER PREFILL 10% NBF 60ML (FORM) ×8 IMPLANT
ELECT REM PT RETURN 9FT ADLT (ELECTROSURGICAL) ×4
ELECTRODE REM PT RTRN 9FT ADLT (ELECTROSURGICAL) ×1 IMPLANT
GLOVE BIO SURGEON STRL SZ8 (GLOVE) ×8 IMPLANT
GOWN STRL REUS W/TWL LRG LVL3 (GOWN DISPOSABLE) ×8 IMPLANT
NS IRRIG 1000ML POUR BTL (IV SOLUTION) ×4 IMPLANT
PACK VAGINAL MINOR WOMEN LF (CUSTOM PROCEDURE TRAY) ×4 IMPLANT
PAD OB MATERNITY 4.3X12.25 (PERSONAL CARE ITEMS) ×4 IMPLANT
SET GENESYS HTA PROCERVA (MISCELLANEOUS) IMPLANT
TOWEL OR 17X24 6PK STRL BLUE (TOWEL DISPOSABLE) ×8 IMPLANT
WATER STERILE IRR 1000ML POUR (IV SOLUTION) ×4 IMPLANT

## 2014-10-25 NOTE — Progress Notes (Signed)
Patient up to bathroom again. Voided. PL 7/10 now. States ready to go home. Will not sit in recliner. Laid back of stretcher.

## 2014-10-25 NOTE — Progress Notes (Signed)
Dr. Jodi Mourning at bedside. Orders given. Patient again rolling in bed uncooperative. More medications given.

## 2014-10-25 NOTE — Op Note (Signed)
Preop Diagnosis: AUB   Postop Diagnosis: AUB   Procedure: DILATATION & CURETTAGE/HYSTEROSCOPY WITH HYDROTHERMAL ABLATION INTRAUTERINE DEVICE (IUD) INSERTION   Anesthesia: General   Anesthesiologist: Josephine Igo, MD   Attending: Shelly Bombard, MD   Assistant:  Findings: Normal endometrium.  Pathology: Endometrial curettings  Fluids: 933ml  UOP: 38ml  EBL: 53YY  Complications: None  Procedure: The patient was taken to the operating room after risks benefits and alternatives were discussed with patient, the patient verbalized understanding and consent signed and witnessed. The patient was placed under general anesthesia and prepped and draped in normal sterile fashion. A bivalve speculum was placed in the patient's vagina and the anterior lip of the cervix was grasped with a single-tooth tenaculum. The cervix was dilated for passage of the hysteroscope. The uterus sounded to 8cm.  The hysteroscope was introduced into the uterine cavity with findings as noted above. A curettage was performed and currettings sent to pathology. The hysteroscope was reintroduced and hydrothermal ablation was performed without difficulty. The tenaculum and bivalve speculum were removed and there was good hemostasis at the tenaculum sites. Sponge lap and needle count was correct. The patient tolerated procedure well and was returned to the recovery room in good condition.

## 2014-10-25 NOTE — Transfer of Care (Signed)
Immediate Anesthesia Transfer of Care Note  Patient: Andrea Montgomery  Procedure(s) Performed: Procedure(s): DILATATION & CURETTAGE/HYSTEROSCOPY WITH HYDROTHERMAL ABLATION (N/A)  Patient Location: PACU  Anesthesia Type:General  Level of Consciousness: awake, alert  and unresponsive  Airway & Oxygen Therapy: Patient Spontanous Breathing and Patient connected to face mask oxygen  Post-op Assessment: Report given to RN and Post -op Vital signs reviewed and stable  Post vital signs: Reviewed and stable  Last Vitals:  Filed Vitals:   10/25/14 0718  BP: 132/82  Pulse: 109  Temp: 36.8 C  Resp: 22    Complications: No apparent anesthesia complications

## 2014-10-25 NOTE — Anesthesia Postprocedure Evaluation (Signed)
  Anesthesia Post-op Note  Patient: Andrea Montgomery  Procedure(s) Performed: Procedure(s): DILATATION & CURETTAGE/HYSTEROSCOPY WITH HYDROTHERMAL ABLATION (N/A)  Patient Location: PACU  Anesthesia Type:General  Level of Consciousness: awake, alert  and oriented  Airway and Oxygen Therapy: Patient Spontanous Breathing  Post-op Pain: none  Post-op Assessment: Post-op Vital signs reviewed, Patient's Cardiovascular Status Stable, Respiratory Function Stable, Patent Airway, No signs of Nausea or vomiting and Pain level controlled              Post-op Vital Signs: Reviewed and stable  Last Vitals:  Filed Vitals:   10/25/14 1045  BP: 160/65  Pulse: 103  Temp:   Resp: 25    Complications: No apparent anesthesia complications

## 2014-10-25 NOTE — Discharge Instructions (Signed)
D&C/Endometrial Ablation Care After Read the instructions below. Refer to this sheet in the next few weeks. These instructions provide you with general information on caring for yourself after you leave the hospital. Your caregiver may also give you specific instructions.  A D&C/endometrial ablation  is a minor operation. A D&C involves the stretching (dilatation) of the cervix and scraping (curettage) of the inside lining of the uterus. Endometrial ablation (EA) is a surgery that makes a womans period much lighter or stops it completely.  You may have light cramping and bleeding for a couple of days to two weeks after the procedure. This procedure may be done in a hospital, outpatient clinic, or doctor's office. You may be given a drug to make you sleep (general anesthetic) or a drug that numbs the area (local anesthetic) in and around the cervix. HOME CARE INSTRUCTIONS  Do not drive for 24 hours.   Wait one week before returning to strenuous activities.   Take your temperature two times a day for 4 days and write it down. Provide these temperatures to your caregiver if they are abnormal (above 98.6 F or 37.0 C).   Avoid long periods of standing, and do no heavy lifting (more than 10 pounds), pushing or pulling.   Limit stair climbing to once or twice a day.   Take rest periods often.   You may resume your usual diet.   Drink plenty of fluids (6-8 glasses a day).   You should return to your usual bowel function. If constipation should occur, you may:   Take a mild laxative with permission from your caregiver.   Add fruit and bran to your diet.   Drink more fluids. This helps with constipation.   Take showers instead of baths until your caregiver gives you permission to take baths.   Do not go swimming or use a hot tub until your caregiver gives you permission.   Try to have someone with you or available for you the first 24 to 48 hours, especially if you had a general  anesthetic.   Do not douche, use tampons, or have intercourse until after your follow-up appointment, or when your caregiver approves.   Only take over-the-counter or prescription medicines for pain, discomfort, or fever as directed by your caregiver. Do not take aspirin. It can cause bleeding.   If a prescription was given, follow your caregiver's directions. You may be given a medicine that kills germs (antibiotic) to prevent an infection.   Keep all your follow-up appointments recommended by your caregiver.  SEEK MEDICAL CARE IF:  You have increasing cramps or pain not relieved with medication.   You develop belly (abdominal) pain which does not seem to be related to the same area of earlier cramping and pain.   You feel dizzy or feel like fainting.   You have bad smelling vaginal discharge.   You develop a rash.   You develop a reaction or allergy to your medication.  SEEK IMMEDIATE MEDICAL CARE IF:  Bleeding is heavier than a normal menstrual period.   You have an oral temperature above 100.6, not controlled by medicine.   You develop chest pain.   You develop shortness of breath.   You pass out.   You develop pain in your shoulder strap area.   You develop heavy vaginal bleeding with or without blood clots.  MAKE SURE YOU:   Understand these instructions.   Will watch your condition.   Will get help right away if  you are not doing well or get worse.  Document Released: 12/19/1999 Document Re-Released: 06/10/2009 [facility] Patient Information 2011 [place].

## 2014-10-25 NOTE — Op Note (Signed)
D&C/Endometrial Ablation Care After Read the instructions below. Refer to this sheet in the next few weeks. These instructions provide you with general information on caring for yourself after you leave the hospital. Your caregiver may also give you specific instructions.  A D&C/endometrial ablation  is a minor operation. A D&C involves the stretching (dilatation) of the cervix and scraping (curettage) of the inside lining of the uterus. Endometrial ablation (EA) is a surgery that makes a woman's period much lighter or stops it completely.  You may have light cramping and bleeding for a couple of days to two weeks after the procedure. This procedure may be done in a hospital, outpatient clinic, or doctor's office. You may be given a drug to make you sleep (general anesthetic) or a drug that numbs the area (local anesthetic) in and around the cervix. HOME CARE INSTRUCTIONS  Do not drive for 24 hours.   Wait one week before returning to strenuous activities.   Take your temperature two times a day for 4 days and write it down. Provide these temperatures to your caregiver if they are abnormal (above 98.6 F or 37.0 C).   Avoid long periods of standing, and do no heavy lifting (more than 10 pounds), pushing or pulling.   Limit stair climbing to once or twice a day.   Take rest periods often.   You may resume your usual diet.   Drink plenty of fluids (6-8 glasses a day).   You should return to your usual bowel function. If constipation should occur, you may:   Take a mild laxative with permission from your caregiver.   Add fruit and bran to your diet.   Drink more fluids. This helps with constipation.   Take showers instead of baths until your caregiver gives you permission to take baths.   Do not go swimming or use a hot tub until your caregiver gives you permission.   Try to have someone with you or available for you the first 24 to 48 hours, especially if you had a general  anesthetic.   Do not douche, use tampons, or have intercourse until after your follow-up appointment, or when your caregiver approves.   Only take over-the-counter or prescription medicines for pain, discomfort, or fever as directed by your caregiver. Do not take aspirin. It can cause bleeding.   If a prescription was given, follow your caregiver's directions. You may be given a medicine that kills germs (antibiotic) to prevent an infection.   Keep all your follow-up appointments recommended by your caregiver.  SEEK MEDICAL CARE IF:  You have increasing cramps or pain not relieved with medication.   You develop belly (abdominal) pain which does not seem to be related to the same area of earlier cramping and pain.   You feel dizzy or feel like fainting.   You have bad smelling vaginal discharge.   You develop a rash.   You develop a reaction or allergy to your medication.  SEEK IMMEDIATE MEDICAL CARE IF:  Bleeding is heavier than a normal menstrual period.   You have an oral temperature above 100.6, not controlled by medicine.   You develop chest pain.   You develop shortness of breath.   You pass out.   You develop pain in your shoulder strap area.   You develop heavy vaginal bleeding with or without blood clots.  MAKE SURE YOU:   Understand these instructions.   Will watch your condition.   Will get help right away if  you are not doing well or get worse.  Document Released: 12/19/1999 Document Re-Released: 06/10/2009 [facility] Patient Information 2011 [place].

## 2014-10-25 NOTE — Anesthesia Procedure Notes (Addendum)
Procedure Name: Intubation Date/Time: 10/25/2014 8:50 AM Performed by: Tobin Chad Pre-anesthesia Checklist: Patient identified, Emergency Drugs available, Suction available, Patient being monitored and Timeout performed Oxygen Delivery Method: Circle system utilized and Simple face mask Preoxygenation: Pre-oxygenation with 100% oxygen Intubation Type: IV induction Ventilation: Mask ventilation with difficulty, Two handed mask ventilation required and Oral airway inserted - appropriate to patient size LMA: LMA inserted LMA Size: 4.0 Laryngoscope Size: Mac and 4 Grade View: Grade III Tube type: Oral Tube size: 7.0 mm Number of attempts: 1 Airway Equipment and Method: Stylet Secured at: 21 cm Tube secured with: Tape Dental Injury: Teeth and Oropharynx as per pre-operative assessment  Difficulty Due To: Difficulty was unanticipated Future Recommendations: Recommend- induction with short-acting agent, and alternative techniques readily available   Procedure Name: LMA Insertion Date/Time: 10/25/2014 8:43 AM Performed by: Stacie Glaze C Patient Re-evaluated:Patient Re-evaluated prior to inductionOxygen Delivery Method: Circle system utilized and Simple face mask Preoxygenation: Pre-oxygenation with 100% oxygen Intubation Type: IV induction Ventilation: Mask ventilation without difficulty LMA: LMA inserted LMA Size: 4.0 Grade View: Grade III Number of attempts: 2 (rplaced lma with supreme   still leaking and requiring high vent pressure  o2 sat dropping) Dental Injury: Teeth and Oropharynx as per pre-operative assessment  Difficulty Due To: Difficulty was unanticipated Future Recommendations: Recommend- induction with short-acting agent, and alternative techniques readily available

## 2014-10-25 NOTE — H&P (Signed)
Andrea Montgomery is an 39 y.o. female. C/O heavy and painful periods.  Pertinent Gynecological History: Menses: flow is moderate Bleeding: dysfunctional uterine bleeding Contraception: tubal ligation DES exposure: denies Blood transfusions: none Sexually transmitted diseases: no past history Previous GYN Procedures: none  Last mammogram: n/a   Menstrual History: Menarche age: 29  Patient's last menstrual period was 09/21/2014.    Past Medical History  Diagnosis Date  . Asthma   . COPD (chronic obstructive pulmonary disease) ([place])   . Diabetes mellitus   . Hypertension   . Iron deficiency anemia 03/12/2014  . Anxiety   . Emphysema of lung [facility])     Past Surgical History  Procedure Laterality Date  . Ceserean section times 2    . 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 Tubal ligation      Family History  Problem Relation Age of Onset  . Hypertension Mother   . Heart disease Mother     Social History:  reports that she has been smoking.  She has never used smokeless tobacco. She reports that she drinks alcohol. She reports that she does not use illicit drugs.  Allergies: No Known Allergies  Prescriptions prior to admission  Medication Sig Dispense Refill Last Dose  . albuterol (PROVENTIL HFA;VENTOLIN HFA) 108 (90 BASE) MCG/ACT inhaler Inhale 2 puffs into the lungs every 6 (six) hours as needed for wheezing or shortness of breath.    10/25/2014 at 0645  . ALPRAZolam (XANAX) 1 MG tablet Take 1 mg by mouth 4 (four) times daily.   10/25/2014 at 0650  . Fluticasone-Salmeterol (ADVAIR DISKUS) 250-50 MCG/DOSE AEPB Inhale 1 puff into the lungs every 12 (twelve) hours. For shortness of breath   10/25/2014 at 0645  . furosemide (LASIX) 40 MG tablet Take 40 mg by mouth daily.   10/25/2014 at Unknown time  . HYDROcodone-acetaminophen (NORCO) 10-325 MG per tablet Take 1 tablet by mouth 4 (four) times daily as needed.  0 10/24/2014 at Unknown time  . insulin aspart protamine-insulin aspart  (NOVOLOG 70/30) (70-30) 100 UNIT/ML injection Inject 15-25 Units into the skin 2 (two) times daily with a meal. 15units in the morning and 25 units at night   10/25/2014 at 0645  . lisinopril-hydrochlorothiazide (PRINZIDE,ZESTORETIC) 10-12.5 MG per tablet Take 1 tablet by mouth daily.  4 10/24/2014 at 0800  . medroxyPROGESTERone (PROVERA) 10 MG tablet Take 2 tablets (20 mg total) by mouth daily. 60 tablet 0   . misoprostol (CYTOTEC) 200 MCG tablet Place 2 tablets intravaginally at 5am on 10-25-14, prior to surgery. 2 tablet 0 10/25/2014 at 0500    Review of Systems  All other systems reviewed and are negative.   Blood pressure 132/82, pulse 109, temperature 98.2 F (36.8 C), temperature source Oral, resp. rate 22, last menstrual period 09/21/2014, SpO2 98 %. Physical Exam  Nursing note and vitals reviewed. Constitutional: She is oriented to person, place, and time. She appears well-developed and well-nourished.  HENT:  Head: Normocephalic and atraumatic.  Eyes: Conjunctivae are normal. Pupils are equal, round, and reactive to light.  Neck: Normal range of motion. Neck supple.  Cardiovascular: Normal rate and regular rhythm.   Respiratory: Effort normal and breath sounds normal.  GI: Soft.  Neurological: She is alert and oriented to person, place, and time.  Skin: Skin is warm and dry.    Results for orders placed or performed during the hospital encounter of 10/25/14 (from the past 24 hour(s))  Pregnancy, urine     Status:  None   Collection Time: 10/25/14  7:15 AM  Result Value Ref Range   Preg Test, Ur NEGATIVE NEGATIVE  Glucose, capillary     Status: Abnormal   Collection Time: 10/25/14  7:40 AM  Result Value Ref Range   Glucose-Capillary 195 (H) 65 - 99 mg/dL    No results found.  Assessment/Plan: AUB.  Hysteroscopy, D&C and  HTA Endometrial Ablation planned.  HARPER,CHARLES A 10/25/2014, 7:53 AM

## 2014-10-25 NOTE — Progress Notes (Signed)
Patient wake, yelling needs to go to bathroom. Patient sleepy but up and assisted to bathroom. Waited in bathroom with patient. Patient moving all around on commode, leaning forward and sideways, yelling profanity. Assisted to recliner after 20 minutes. Pain medicine given. Patient refusing to sit still in recliner. Patient assisted back to bed.

## 2014-10-25 NOTE — Progress Notes (Signed)
On arrival to PACU  Patient was thrashing around in bed c/o needs to go to bathroom to pee. Yelling loudly, uncooperatue. Tried to explain to patient she had just arrived and patient was given bedpan with no success. Pain medicine was given and patient was sleeping.

## 2014-10-25 NOTE — Anesthesia Preprocedure Evaluation (Signed)
Anesthesia Evaluation  Patient identified by MRN, date of birth, ID band Patient awake    Reviewed: Allergy & Precautions, NPO status , Patient's Chart, lab work & pertinent test results, reviewed documented beta blocker date and time   Airway Mallampati: III  TM Distance: >3 FB Neck ROM: Full    Dental no notable dental hx. (+) Teeth Intact   Pulmonary asthma , COPD,  COPD inhaler, Current Smoker,    Pulmonary exam normal breath sounds clear to auscultation + decreased breath sounds      Cardiovascular hypertension, Pt. on medications  Rhythm:Regular Rate:Tachycardia     Neuro/Psych Anxiety negative neurological ROS     GI/Hepatic negative GI ROS, Neg liver ROS,   Endo/Other  diabetes, Poorly Controlled, Type 2, Oral Hypoglycemic AgentsMorbid obesity  Renal/GU negative Renal ROS  negative genitourinary   Musculoskeletal  (+) Arthritis ,   Abdominal (+) + obese,   Peds  Hematology  (+) anemia , REFUSES BLOOD PRODUCTS,   Anesthesia Other Findings   Reproductive/Obstetrics AUB  Menorrhagia                             Anesthesia Physical Anesthesia Plan  ASA: III  Anesthesia Plan: General   Post-op Pain Management:    Induction: Intravenous  Airway Management Planned: LMA  Additional Equipment:   Intra-op Plan:   Post-operative Plan: Extubation in OR  Informed Consent: I have reviewed the patients History and Physical, chart, labs and discussed the procedure including the risks, benefits and alternatives for the proposed anesthesia with the patient or authorized representative who has indicated his/her understanding and acceptance.   Dental advisory given  Plan Discussed with: CRNA, Anesthesiologist and Surgeon  Anesthesia Plan Comments:         Anesthesia Quick Evaluation

## 2014-10-27 ENCOUNTER — Encounter (HOSPITAL_COMMUNITY): Payer: Self-pay | Admitting: Obstetrics

## 2014-10-28 ENCOUNTER — Other Ambulatory Visit: Payer: Self-pay | Admitting: Obstetrics

## 2014-10-28 ENCOUNTER — Telehealth: Payer: Self-pay | Admitting: *Deleted

## 2014-10-28 DIAGNOSIS — R110 Nausea: Secondary | ICD-10-CM

## 2014-10-28 DIAGNOSIS — T8859XS Other complications of anesthesia, sequela: Principal | ICD-10-CM

## 2014-10-28 MED ORDER — ONDANSETRON HCL 8 MG PO TABS: 8.0000 mg | ORAL_TABLET | Freq: Three times a day (TID) | ORAL | Status: DC | PRN

## 2014-10-28 NOTE — Telephone Encounter (Signed)
Patient state she has had bleeding, pain and vomiting since her procedure on Friday. 1:45 Dr Jodi Mourning called patient- after assessment- Zofran Rx 'd to pharmacy, patient encouraged to push fluids, eat bland diet, bleeding after surgery normal.

## 2014-10-29 ENCOUNTER — Telehealth: Payer: Self-pay | Admitting: *Deleted

## 2014-10-29 ENCOUNTER — Ambulatory Visit: Payer: Medicaid Other

## 2014-10-29 NOTE — Telephone Encounter (Signed)
Left message to reschedule missed feraheme appt

## 2014-10-31 ENCOUNTER — Telehealth: Payer: Self-pay | Admitting: *Deleted

## 2014-10-31 ENCOUNTER — Other Ambulatory Visit: Payer: Self-pay | Admitting: Obstetrics

## 2014-10-31 DIAGNOSIS — N730 Acute parametritis and pelvic cellulitis: Secondary | ICD-10-CM

## 2014-10-31 MED ORDER — DOXYCYCLINE HYCLATE 100 MG PO CAPS: 100.0000 mg | ORAL_CAPSULE | Freq: Two times a day (BID) | ORAL | Status: DC

## 2014-10-31 MED ORDER — METRONIDAZOLE 500 MG PO TABS: 500.0000 mg | ORAL_TABLET | Freq: Two times a day (BID) | ORAL | Status: DC

## 2014-10-31 NOTE — Telephone Encounter (Signed)
Patient has questions about her antibiotics. 5:41 Spoke to patient- she just picked up Zofran. She state she has had no change in her nausea and pain. She is concerned about yeast with the antibiotics. Told patient she need to start her Zofran and keep some food down. Eat bland diet until she has good response. Seth Bake to call her in the am to check on her.

## 2014-11-01 NOTE — Telephone Encounter (Signed)
Attempted to contact the patient multiple times. Received busy signal.

## 2014-11-02 ENCOUNTER — Emergency Department (HOSPITAL_COMMUNITY): Payer: Medicaid Other

## 2014-11-02 ENCOUNTER — Encounter (HOSPITAL_COMMUNITY): Payer: Self-pay | Admitting: Nurse Practitioner

## 2014-11-02 ENCOUNTER — Emergency Department (HOSPITAL_COMMUNITY)
Admission: EM | Admit: 2014-11-02 | Discharge: 2014-11-02 | Disposition: A | Discharge status: Home / Self Care | Payer: Medicaid Other | Attending: Emergency Medicine | Admitting: Emergency Medicine

## 2014-11-02 DIAGNOSIS — I10 Essential (primary) hypertension: Secondary | ICD-10-CM | POA: Insufficient documentation

## 2014-11-02 DIAGNOSIS — Z792 Long term (current) use of antibiotics: Secondary | ICD-10-CM | POA: Diagnosis not present

## 2014-11-02 DIAGNOSIS — Z862 Personal history of diseases of the blood and blood-forming organs and certain disorders involving the immune mechanism: Secondary | ICD-10-CM | POA: Insufficient documentation

## 2014-11-02 DIAGNOSIS — Z720 Tobacco use: Secondary | ICD-10-CM | POA: Diagnosis not present

## 2014-11-02 DIAGNOSIS — Z79899 Other long term (current) drug therapy: Secondary | ICD-10-CM | POA: Diagnosis not present

## 2014-11-02 DIAGNOSIS — Z793 Long term (current) use of hormonal contraceptives: Secondary | ICD-10-CM | POA: Diagnosis not present

## 2014-11-02 DIAGNOSIS — Y998 Other external cause status: Secondary | ICD-10-CM | POA: Insufficient documentation

## 2014-11-02 DIAGNOSIS — S3992XA Unspecified injury of lower back, initial encounter: Secondary | ICD-10-CM | POA: Insufficient documentation

## 2014-11-02 DIAGNOSIS — Z794 Long term (current) use of insulin: Secondary | ICD-10-CM | POA: Diagnosis not present

## 2014-11-02 DIAGNOSIS — Z3202 Encounter for pregnancy test, result negative: Secondary | ICD-10-CM | POA: Diagnosis not present

## 2014-11-02 DIAGNOSIS — M545 Low back pain, unspecified: Secondary | ICD-10-CM

## 2014-11-02 DIAGNOSIS — S8991XA Unspecified injury of right lower leg, initial encounter: Secondary | ICD-10-CM | POA: Diagnosis not present

## 2014-11-02 DIAGNOSIS — Y9389 Activity, other specified: Secondary | ICD-10-CM | POA: Insufficient documentation

## 2014-11-02 DIAGNOSIS — J449 Chronic obstructive pulmonary disease, unspecified: Secondary | ICD-10-CM | POA: Diagnosis not present

## 2014-11-02 DIAGNOSIS — F419 Anxiety disorder, unspecified: Secondary | ICD-10-CM | POA: Insufficient documentation

## 2014-11-02 DIAGNOSIS — E119 Type 2 diabetes mellitus without complications: Secondary | ICD-10-CM | POA: Insufficient documentation

## 2014-11-02 DIAGNOSIS — Y92410 Unspecified street and highway as the place of occurrence of the external cause: Secondary | ICD-10-CM | POA: Diagnosis not present

## 2014-11-02 DIAGNOSIS — M25561 Pain in right knee: Secondary | ICD-10-CM

## 2014-11-02 LAB — POC URINE PREG, ED: Preg Test, Ur: NEGATIVE
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
## 2014-11-02 NOTE — Discharge Instructions (Signed)
You may take ibuprofen as prescribed over-the-counter for pain relief. I also recommend using a heating pad or ice for pain relief. Follow-up with your primary care provider in one week. Return to the emergency department if symptoms worsen or new onset of denies fever, numbness, tingling, loss of bowel or bladder, weakness.
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
## 2014-11-20 ENCOUNTER — Telehealth: Payer: Self-pay | Admitting: Hematology and Oncology

## 2014-11-20 NOTE — Telephone Encounter (Signed)
returned call and s.w. pt and r/s missed iron...done....pt ok and aware

## 2014-11-22 ENCOUNTER — Other Ambulatory Visit: Payer: Self-pay | Admitting: Hematology and Oncology

## 2014-11-25 ENCOUNTER — Telehealth: Payer: Self-pay | Admitting: *Deleted

## 2014-11-25 NOTE — Telephone Encounter (Signed)
Called pt to confirm she will be here for IV Iron tomorrow as scheduled.  She had to r/s some missed Feraheme appts from last month.  Informed pt she will probably need to be scheduled for second Feraheme infusion next week.  Instructed her to ask for schedule when she comes in tomorrow.  She verbalized understanding.

## 2014-11-25 NOTE — Telephone Encounter (Signed)
Informed pt no need for IV iron next week since tomorrow is actually the second dose.  Next appt will be in Feb as scheduled.  She verbalized understanding.

## 2014-11-25 NOTE — Telephone Encounter (Signed)
Reschedule whenever possible

## 2014-11-26 ENCOUNTER — Ambulatory Visit (HOSPITAL_BASED_OUTPATIENT_CLINIC_OR_DEPARTMENT_OTHER): Payer: Medicaid Other

## 2014-11-26 VITALS — BP 145/89 | HR 84 | Temp 97.0°F | Resp 20

## 2014-11-26 DIAGNOSIS — D509 Iron deficiency anemia, unspecified: Secondary | ICD-10-CM | POA: Diagnosis present

## 2014-11-26 MED ORDER — SODIUM CHLORIDE 0.9 % IV SOLN
510.0000 mg | Freq: Once | INTRAVENOUS | Status: AC
  Administered 2014-11-26: 510 mg via INTRAVENOUS
  Filled 2014-11-26: qty 17

## 2014-11-26 MED ORDER — SODIUM CHLORIDE 0.9 % IV SOLN
Freq: Once | INTRAVENOUS | Status: AC
  Administered 2014-11-26: 10:00:00 via INTRAVENOUS

## 2014-11-26 NOTE — Patient Instructions (Signed)

## 2014-11-27 ENCOUNTER — Ambulatory Visit (INDEPENDENT_AMBULATORY_CARE_PROVIDER_SITE_OTHER): Payer: Medicaid Other | Admitting: Obstetrics

## 2014-11-27 ENCOUNTER — Encounter: Payer: Self-pay | Admitting: Obstetrics

## 2014-11-27 VITALS — BP 148/88 | HR 97 | Temp 97.1°F | Ht 62.0 in

## 2014-11-27 DIAGNOSIS — G8918 Other acute postprocedural pain: Secondary | ICD-10-CM

## 2014-11-27 DIAGNOSIS — N939 Abnormal uterine and vaginal bleeding, unspecified: Secondary | ICD-10-CM | POA: Diagnosis not present

## 2014-11-27 DIAGNOSIS — Z9889 Other specified postprocedural states: Secondary | ICD-10-CM

## 2014-11-27 MED ORDER — OXYCODONE HCL 10 MG PO TABS: 10.0000 mg | ORAL_TABLET | Freq: Four times a day (QID) | ORAL | Status: DC | PRN

## 2014-11-29 ENCOUNTER — Encounter: Payer: Self-pay | Admitting: Obstetrics

## 2014-11-29 NOTE — Progress Notes (Signed)
Patient ID: Andrea Montgomery, female   DOB: Sep 13, 1975, 39 y.o.   MRN: [id number]  Chief Complaint  Patient presents with  . Vaginal Bleeding    Post Op from 10/25/14.     HPI Andrea Montgomery is a 39 y.o. female.  S/P Endometrial Ablation.  4 weeks post op.  Having light bleeding and some cramping. HPI  Past Medical History  Diagnosis Date  . Asthma   . COPD (chronic obstructive pulmonary disease) ([place])   . Diabetes mellitus   . Hypertension   . Iron deficiency anemia 03/12/2014  . Anxiety   . Emphysema of lung [facility])     Past Surgical History  Procedure Laterality Date  . Ceserean section times 2    . 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 Tubal ligation    . Dilitation & currettage/hystroscopy with hydrothermal ablation N/A 10/25/2014    Procedure: DILATATION & CURETTAGE/HYSTEROSCOPY WITH HYDROTHERMAL ABLATION;  Surgeon: Shelly Bombard, MD;  Location: [facility];  Service: Gynecology;  Laterality: N/A;    Family History  Problem Relation Age of Onset  . Hypertension Mother   . Heart disease Mother     Social History Social History  Substance Use Topics  . Smoking status: Current Every Day Smoker  . Smokeless tobacco: Never Used  . Alcohol Use: No     Comment: occ    No Known Allergies  Current Outpatient Prescriptions  Medication Sig Dispense Refill  . albuterol (PROVENTIL HFA;VENTOLIN HFA) 108 (90 BASE) MCG/ACT inhaler Inhale 2 puffs into the lungs every 6 (six) hours as needed for wheezing or shortness of breath.     . ALPRAZolam (XANAX) 1 MG tablet Take 1 mg by mouth 4 (four) times daily.    . Fluticasone-Salmeterol (ADVAIR DISKUS) 250-50 MCG/DOSE AEPB Inhale 1 puff into the lungs every 12 (twelve) hours. For shortness of breath    . furosemide (LASIX) 40 MG tablet Take 40 mg by mouth daily.    . insulin aspart protamine-insulin aspart (NOVOLOG 70/30) (70-30) 100 UNIT/ML injection Inject 15-25 Units into the skin 2 (two) times daily with a meal. 15units in the morning and 25  units at night    . medroxyPROGESTERone (PROVERA) 10 MG tablet Take 2 tablets (20 mg total) by mouth daily. 60 tablet 0  . HYDROcodone-acetaminophen (NORCO) 10-325 MG per tablet Take 1 tablet by mouth 4 (four) times daily as needed.  0  . Oxycodone HCl 10 MG TABS Take 1 tablet (10 mg total) by mouth every 6 (six) hours as needed. 40 tablet 0   No current facility-administered medications for this visit.    Review of Systems Review of Systems Constitutional: negative for fatigue and weight loss Respiratory: negative for cough and wheezing Cardiovascular: negative for chest pain, fatigue and palpitations Gastrointestinal: negative for abdominal pain and change in bowel habits Genitourinary:negative Integument/breast: negative for nipple discharge Musculoskeletal:negative for myalgias Neurological: negative for gait problems and tremors Behavioral/Psych: negative for abusive relationship, depression Endocrine: negative for temperature intolerance     Blood pressure 148/88, pulse 97, temperature 97.1 F (36.2 C), height 5\' 2"  (1.575 m).  Physical Exam Physical Exam General:   alert  Skin:   no rash or abnormalities  Lungs:   clear to auscultation bilaterally  Heart:   regular rate and rhythm, S1, S2 normal, no murmur, click, rub or gallop  Breasts:   normal without suspicious masses, skin or nipple changes or axillary nodes  Abdomen:  normal findings: no organomegaly,  soft, non-tender and no hernia  Pelvis:  External genitalia: normal general appearance Urinary system: urethral meatus normal and bladder without fullness, nontender Vaginal: normal without tenderness, induration or masses.  Scant menstrual like bleeding Cervix: normal appearance Adnexa: normal bimanual exam Uterus: anteverted and tender, normal size      Data Reviewed Labs  Assessment     AUB S/P Endometrial Ablation.  Doing well.     Plan    Oxycodone Rx F/U 4 weeks.  Orders Placed This Encounter   Procedures  . SureSwab, Vaginosis/Vaginitis Plus   Meds ordered this encounter  Medications  . Oxycodone HCl 10 MG TABS    Sig: Take 1 tablet (10 mg total) by mouth every 6 (six) hours as needed.    Dispense:  40 tablet    Refill:  0

## 2014-12-03 LAB — SURESWAB, VAGINOSIS/VAGINITIS PLUS
ATOPOBIUM VAGINAE: DETECTED Log (cells/mL)
BV CATEGORY: UNDETERMINED — AB
C. PARAPSILOSIS, DNA: NOT DETECTED
C. TROPICALIS, DNA: NOT DETECTED
C. albicans, DNA: NOT DETECTED
C. glabrata, DNA: NOT DETECTED
C. trachomatis RNA, TMA: NOT DETECTED
GARDNERELLA VAGINALIS: 5.4 Log (cells/mL)
LACTOBACILLUS SPECIES: 5.1 Log (cells/mL)
MEGASPHAERA SPECIES: NOT DETECTED Log (cells/mL)
N. gonorrhoeae RNA, TMA: NOT DETECTED
T. VAGINALIS RNA, QL TMA: DETECTED — AB

## 2014-12-04 ENCOUNTER — Other Ambulatory Visit: Payer: Self-pay | Admitting: Obstetrics

## 2014-12-04 DIAGNOSIS — B9689 Other specified bacterial agents as the cause of diseases classified elsewhere: Secondary | ICD-10-CM

## 2014-12-04 DIAGNOSIS — A5901 Trichomonal vulvovaginitis: Secondary | ICD-10-CM

## 2014-12-04 DIAGNOSIS — N760 Acute vaginitis: Secondary | ICD-10-CM

## 2014-12-04 MED ORDER — TINIDAZOLE 500 MG PO TABS: 2.0000 g | ORAL_TABLET | Freq: Every day | ORAL | Status: DC

## 2014-12-25 ENCOUNTER — Encounter: Payer: Self-pay | Admitting: Obstetrics

## 2014-12-25 ENCOUNTER — Ambulatory Visit (INDEPENDENT_AMBULATORY_CARE_PROVIDER_SITE_OTHER): Payer: Medicaid Other | Admitting: Obstetrics

## 2014-12-25 ENCOUNTER — Other Ambulatory Visit: Payer: Self-pay | Admitting: Hematology and Oncology

## 2014-12-25 ENCOUNTER — Ambulatory Visit: Payer: Medicaid Other | Admitting: Obstetrics

## 2014-12-25 VITALS — BP 137/80 | HR 104

## 2014-12-25 DIAGNOSIS — N939 Abnormal uterine and vaginal bleeding, unspecified: Secondary | ICD-10-CM

## 2014-12-25 DIAGNOSIS — B373 Candidiasis of vulva and vagina: Secondary | ICD-10-CM

## 2014-12-25 DIAGNOSIS — N719 Inflammatory disease of uterus, unspecified: Secondary | ICD-10-CM

## 2014-12-25 DIAGNOSIS — B3731 Acute candidiasis of vulva and vagina: Secondary | ICD-10-CM

## 2014-12-25 DIAGNOSIS — G8918 Other acute postprocedural pain: Secondary | ICD-10-CM

## 2014-12-25 DIAGNOSIS — Z9889 Other specified postprocedural states: Secondary | ICD-10-CM

## 2014-12-25 MED ORDER — KETOROLAC TROMETHAMINE 60 MG/2ML IM SOLN
60.0000 mg | Freq: Once | INTRAMUSCULAR | Status: AC
  Administered 2014-12-25: 60 mg via INTRAMUSCULAR

## 2014-12-25 MED ORDER — DOXYCYCLINE HYCLATE 100 MG PO CAPS: 100.0000 mg | ORAL_CAPSULE | Freq: Two times a day (BID) | ORAL | Status: DC

## 2014-12-25 MED ORDER — OXYCODONE-ACETAMINOPHEN 10-325 MG PO TABS: 1.0000 | ORAL_TABLET | ORAL | Status: DC | PRN

## 2014-12-25 MED ORDER — FLUCONAZOLE 150 MG PO TABS: 150.0000 mg | ORAL_TABLET | Freq: Once | ORAL | Status: DC

## 2014-12-25 MED ORDER — HYDROCODONE-ACETAMINOPHEN 10-325 MG PO TABS: 1.0000 | ORAL_TABLET | Freq: Four times a day (QID) | ORAL | Status: DC | PRN

## 2014-12-25 MED ORDER — METRONIDAZOLE 500 MG PO TABS: 500.0000 mg | ORAL_TABLET | Freq: Two times a day (BID) | ORAL | Status: DC

## 2014-12-25 NOTE — Progress Notes (Signed)
Subjective:        Andrea Montgomery is a 39 y.o. female here for a routine exam.  Current complaints: Continued pain and vaginal bleeding after Endometrial Ablation done 4 weeks ago.   Past Medical History  Diagnosis Date  . Asthma   . COPD (chronic obstructive pulmonary disease) ([place])   . Diabetes mellitus   . Hypertension   . Iron deficiency anemia 03/12/2014  . Anxiety   . Emphysema of lung [facility])     Past Surgical History  Procedure Laterality Date  . Ceserean section times 2    . 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 Tubal ligation    . Dilitation & currettage/hystroscopy with hydrothermal ablation N/A 10/25/2014    Procedure: DILATATION & CURETTAGE/HYSTEROSCOPY WITH HYDROTHERMAL ABLATION;  Surgeon: Shelly Bombard, MD;  Location: [facility];  Service: Gynecology;  Laterality: N/A;     Current outpatient prescriptions:  .  albuterol (PROVENTIL HFA;VENTOLIN HFA) 108 (90 BASE) MCG/ACT inhaler, Inhale 2 puffs into the lungs every 6 (six) hours as needed for wheezing or shortness of breath. , Disp: , Rfl:  .  ALPRAZolam (XANAX) 1 MG tablet, Take 1 mg by mouth 4 (four) times daily., Disp: , Rfl:  .  Fluticasone-Salmeterol (ADVAIR DISKUS) 250-50 MCG/DOSE AEPB, Inhale 1 puff into the lungs every 12 (twelve) hours. For shortness of breath, Disp: , Rfl:  .  furosemide (LASIX) 40 MG tablet, Take 40 mg by mouth daily., Disp: , Rfl:  .  insulin aspart protamine-insulin aspart (NOVOLOG 70/30) (70-30) 100 UNIT/ML injection, Inject 15-25 Units into the skin 2 (two) times daily with a meal. 15units in the morning and 25 units at night, Disp: , Rfl:  .  olmesartan (BENICAR) 5 MG tablet, Take by mouth daily., Disp: , Rfl:  .  doxycycline (VIBRAMYCIN) 100 MG capsule, Take 1 capsule (100 mg total) by mouth 2 (two) times daily., Disp: 28 capsule, Rfl: 1 .  fluconazole (DIFLUCAN) 150 MG tablet, Take 1 tablet (150 mg total) by mouth once., Disp: 1 tablet, Rfl: 2 .  medroxyPROGESTERone (PROVERA) 10 MG tablet,  Take 2 tablets (20 mg total) by mouth daily. (Patient not taking: Reported on 12/25/2014), Disp: 60 tablet, Rfl: 0 .  metroNIDAZOLE (FLAGYL) 500 MG tablet, Take 1 tablet (500 mg total) by mouth 2 (two) times daily., Disp: 28 tablet, Rfl: 1 .  Oxycodone HCl 10 MG TABS, Take 1 tablet (10 mg total) by mouth every 6 (six) hours as needed. (Patient not taking: Reported on 12/25/2014), Disp: 40 tablet, Rfl: 0 .  oxyCODONE-acetaminophen (PERCOCET) 10-325 MG tablet, Take 1 tablet by mouth every 4 (four) hours as needed for pain., Disp: 40 tablet, Rfl: 0 .  tinidazole (TINDAMAX) 500 MG tablet, Take 4 tablets (2,000 mg total) by mouth daily. (Patient not taking: Reported on 12/25/2014), Disp: 8 tablet, Rfl: 0 No Known Allergies  Social History  Substance Use Topics  . Smoking status: Current Every Day Smoker  . Smokeless tobacco: Never Used  . Alcohol Use: No     Comment: occ    Family History  Problem Relation Age of Onset  . Hypertension Mother   . Heart disease Mother       Review of Systems  Constitutional: negative for fatigue and weight loss Respiratory: negative for cough and wheezing Cardiovascular: negative for chest pain, fatigue and palpitations Gastrointestinal: negative for abdominal pain and change in bowel habits Musculoskeletal:negative for myalgias Neurological: negative for gait problems and tremors Behavioral/Psych: negative  for abusive relationship, depression Endocrine: negative for temperature intolerance   Genitourinary:positive for pelvic pain and vaginal bleeding Integument/breast: negative for breast lump, breast tenderness, nipple discharge and skin lesion(s)    Objective:       BP 137/80 mmHg  Pulse 104  LMP  General:   alert  Skin:   no rash or abnormalities  Lungs:   clear to auscultation bilaterally  Heart:   regular rate and rhythm, S1, S2 normal, no murmur, click, rub or gallop  Breasts:   normal without suspicious masses, skin or nipple changes or  axillary nodes  Abdomen:  normal findings: no organomegaly, soft, non-tender and no hernia  Pelvis:  External genitalia: normal general appearance Urinary system: urethral meatus normal and bladder without fullness, nontender Vaginal: normal without tenderness, induration or masses.  Menstrual blood in vault Cervix: normal appearance Adnexa: normal bimanual exam Uterus: anteverted and non-tender, normal size   Lab Review Urine pregnancy test Labs reviewed yes Radiologic studies reviewed yes    Assessment:    Post op pain and vaginal bleeding after HTA Endometrial Ablation.  Probable mild post op endometritis.  Has H/O BV and Trichomonas vaginitis prior to procedure.   Plan:   Doxy / Flagyl Rx x 2 weeks Percocet Rx  Education reviewed: Care after Endometrial Ablation and what to expect.. Follow up in: 6 weeks.   Meds ordered this encounter  Medications  . olmesartan (BENICAR) 5 MG tablet    Sig: Take by mouth daily.  Marland Kitchen DISCONTD: HYDROcodone-acetaminophen (NORCO) 10-325 MG tablet    Sig: Take 1 tablet by mouth 4 (four) times daily as needed. Reported on 12/25/2014    Dispense:  30 tablet    Refill:  0  . doxycycline (VIBRAMYCIN) 100 MG capsule    Sig: Take 1 capsule (100 mg total) by mouth 2 (two) times daily.    Dispense:  28 capsule    Refill:  1  . metroNIDAZOLE (FLAGYL) 500 MG tablet    Sig: Take 1 tablet (500 mg total) by mouth 2 (two) times daily.    Dispense:  28 tablet    Refill:  1  . DISCONTD: HYDROcodone-acetaminophen (NORCO) 10-325 MG tablet    Sig: Take 1 tablet by mouth every 6 (six) hours as needed. Reported on 12/25/2014    Dispense:  40 tablet    Refill:  0  . oxyCODONE-acetaminophen (PERCOCET) 10-325 MG tablet    Sig: Take 1 tablet by mouth every 4 (four) hours as needed for pain.    Dispense:  40 tablet    Refill:  0  . ketorolac (TORADOL) injection 60 mg    Sig:   . fluconazole (DIFLUCAN) 150 MG tablet    Sig: Take 1 tablet (150 mg total) by  mouth once.    Dispense:  1 tablet    Refill:  2   Orders Placed This Encounter  Procedures  . SureSwab, Vaginosis/Vaginitis Plus

## 2014-12-26 ENCOUNTER — Ambulatory Visit: Payer: Medicaid Other | Admitting: Obstetrics

## 2014-12-27 ENCOUNTER — Ambulatory Visit: Payer: Medicaid Other | Attending: Orthopedic Surgery | Admitting: Physical Therapy

## 2014-12-31 LAB — SURESWAB, VAGINOSIS/VAGINITIS PLUS
Atopobium vaginae: DETECTED Log (cells/mL)
BV CATEGORY: UNDETERMINED — AB
C. ALBICANS, DNA: NOT DETECTED
C. GLABRATA, DNA: NOT DETECTED
C. TRACHOMATIS RNA, TMA: NOT DETECTED
C. parapsilosis, DNA: NOT DETECTED
C. tropicalis, DNA: NOT DETECTED
GARDNERELLA VAGINALIS: 5.3 Log (cells/mL)
LACTOBACILLUS SPECIES: 5.2 Log (cells/mL)
MEGASPHAERA SPECIES: NOT DETECTED Log (cells/mL)
N. gonorrhoeae RNA, TMA: NOT DETECTED
T. vaginalis RNA, QL TMA: DETECTED — AB

## 2015-01-01 ENCOUNTER — Other Ambulatory Visit: Payer: Self-pay | Admitting: Obstetrics

## 2015-01-02 ENCOUNTER — Other Ambulatory Visit: Payer: Self-pay | Admitting: Obstetrics

## 2015-01-02 DIAGNOSIS — B373 Candidiasis of vulva and vagina: Secondary | ICD-10-CM

## 2015-01-02 DIAGNOSIS — A5901 Trichomonal vulvovaginitis: Secondary | ICD-10-CM

## 2015-01-02 DIAGNOSIS — B3731 Acute candidiasis of vulva and vagina: Secondary | ICD-10-CM

## 2015-01-02 DIAGNOSIS — N760 Acute vaginitis: Principal | ICD-10-CM

## 2015-01-02 DIAGNOSIS — B9689 Other specified bacterial agents as the cause of diseases classified elsewhere: Secondary | ICD-10-CM

## 2015-01-02 MED ORDER — METRONIDAZOLE 500 MG PO TABS: ORAL_TABLET | ORAL | Status: DC

## 2015-01-02 MED ORDER — CLINDAMYCIN HCL 300 MG PO CAPS: 300.0000 mg | ORAL_CAPSULE | Freq: Three times a day (TID) | ORAL | Status: DC

## 2015-01-02 MED ORDER — FLUCONAZOLE 150 MG PO TABS: 150.0000 mg | ORAL_TABLET | Freq: Once | ORAL | Status: DC

## 2015-01-02 NOTE — Telephone Encounter (Signed)
Patient seen in the office since this phone call.

## 2015-01-20 ENCOUNTER — Ambulatory Visit: Payer: Medicaid Other | Attending: Orthopedic Surgery | Admitting: Physical Therapy

## 2015-01-20 DIAGNOSIS — R262 Difficulty in walking, not elsewhere classified: Secondary | ICD-10-CM

## 2015-01-20 DIAGNOSIS — M25561 Pain in right knee: Secondary | ICD-10-CM

## 2015-01-20 DIAGNOSIS — R2681 Unsteadiness on feet: Secondary | ICD-10-CM | POA: Diagnosis present

## 2015-01-20 DIAGNOSIS — M25562 Pain in left knee: Secondary | ICD-10-CM

## 2015-01-20 DIAGNOSIS — M545 Low back pain: Secondary | ICD-10-CM

## 2015-01-20 DIAGNOSIS — Z7409 Other reduced mobility: Secondary | ICD-10-CM | POA: Diagnosis not present

## 2015-01-20 NOTE — Patient Instructions (Signed)
    Copyright  VHI. All rights reserved.  Heel Slide   Bend knee and pull heel toward buttocks. Hold _10___ seconds. Return. Repeat with other knee. Repeat ___10_ times. Do _2  sessions per day.  http://gt2.exer.us/372   Copyright  VHI. All rights reserved.     Raise leg until knee is straight. _10__ reps per set, _2__ sets per day, _7_ days per week  Copyright  VHI. All rights reserved.   http://gt2.exer.us/365   Copyright  VHI. All rights reserved.  Quad Set   Slowly tighten muscles on thigh of straight leg while counting out loud to __5__. Repeat with other leg. Repeat ___10_ times. Do ___2_ sessions per day.  http://gt2.exer.us/361   Copyright  VHI. All rights reserved.    Lower Trunk Rotation Stretch    Keeping back flat and feet together, rotate knees to left side. Hold __10__ seconds. Repeat _5-10__ times per set. Do _2___ sets per session. Do __2__ sessions per day.  http://orth.exer.us/123   Copyright  VHI. All rights reserved.  Abdominal Bracing With Pelvic Floor (Hook-Lying)    With neutral spine, tighten pelvic floor and abdominals. Repeat _10_ times. Do _2-3  times a day.   Copyright  VHI. All rights reserved.
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
## 2015-01-22 ENCOUNTER — Ambulatory Visit: Payer: Medicaid Other | Admitting: Physical Therapy

## 2015-01-27 ENCOUNTER — Ambulatory Visit: Payer: Medicaid Other | Admitting: Physical Therapy

## 2015-01-27 DIAGNOSIS — M25561 Pain in right knee: Secondary | ICD-10-CM

## 2015-01-27 DIAGNOSIS — R2681 Unsteadiness on feet: Secondary | ICD-10-CM

## 2015-01-27 DIAGNOSIS — M25562 Pain in left knee: Secondary | ICD-10-CM

## 2015-01-27 DIAGNOSIS — M545 Low back pain: Secondary | ICD-10-CM

## 2015-01-27 DIAGNOSIS — Z7409 Other reduced mobility: Secondary | ICD-10-CM | POA: Diagnosis not present

## 2015-01-27 DIAGNOSIS — R262 Difficulty in walking, not elsewhere classified: Secondary | ICD-10-CM

## 2015-01-27 NOTE — Therapy (Signed)
[place], [place], [address] Phone: [phone]   Fax:  [phone]  Physical Therapy Treatment  Patient Details  Name: Andrea Montgomery MRN: [id number] Date of Birth: 03-03-1975 Referring Provider: Marlou Sa   Encounter Date: 01/27/2015      PT End of Session - 01/27/15 0949    Visit Number 2   Number of Visits 16   Date for PT Re-Evaluation 03/17/15   PT Start Time 0935   PT Stop Time 1020   PT Time Calculation (min) 45 min   Activity Tolerance Patient limited by pain   Behavior During Therapy Anxious;WFL for tasks assessed/performed      Past Medical History  Diagnosis Date  . Asthma   . COPD (chronic obstructive pulmonary disease) ([place])   . Diabetes mellitus   . Hypertension   . Iron deficiency anemia 03/12/2014  . Anxiety   . Emphysema of lung [facility])     Past Surgical History  Procedure Laterality Date  . Ceserean section times 2    . 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 Tubal ligation    . Dilitation & currettage/hystroscopy with hydrothermal ablation N/A 10/25/2014    Procedure: DILATATION & CURETTAGE/HYSTEROSCOPY WITH HYDROTHERMAL ABLATION;  Surgeon: Shelly Bombard, MD;  Location: [facility];  Service: Gynecology;  Laterality: N/A;    There were no vitals filed for this visit.  Visit Diagnosis:  Decreased functional mobility and endurance  Pain in both knees  Midline low back pain, with sciatica presence unspecified  Difficulty walking  Gait instability      Subjective Assessment - 01/27/15 0937    Subjective The exercises you gave me made my back hurt worse.  Took pain meds today. Slipped down 9 steps the other day, knee gave out.    Currently in Pain? Yes   Pain Score 7    Pain Location Knee   Pain Orientation Right   Pain Type Chronic pain   Pain Onset More than a month ago   Pain Frequency Constant   Pain Score 7   Pain Location Back   Pain Orientation Lower   Pain Type Chronic  pain   Pain Onset More than a month ago   Pain Frequency Constant            [facility] Adult PT Treatment/Exercise - 01/27/15 0945    Lumbar Exercises: Stretches   Single Knee to Chest Stretch 2 reps;30 seconds   Lower Trunk Rotation 5 reps   Lower Trunk Rotation Limitations very painful    Pelvic Tilt 5 reps   Knee/Hip Exercises: Supine   Quad Sets Strengthening;Both;1 set;10 reps   Short Arc Quad Sets --   Heel Slides AROM;Strengthening;Both;1 set;10 reps   Moist Heat Therapy   Number Minutes Moist Heat 15 Minutes   Moist Heat Location Lumbar Spine;Knee  Rt   Electrical Stimulation   Electrical Stimulation Location lumbar   Electrical Stimulation Action IFC   Electrical Stimulation Parameters to tol (25)   Electrical Stimulation Goals Pain     Increased time for there ex due to pain and anxiety.  Walks and transfers slowly, with pain.            PT Education - 01/27/15 1004    Education provided Yes   Education Details E-stim, HEP guidance   Person(s) Educated Patient   Methods Explanation   Comprehension Verbalized understanding          PT Short Term  Goals - 01/27/15 0950    PT SHORT TERM GOAL #1   Title Pt will be able to demo initial HEP with independence    Status On-going   PT SHORT TERM GOAL #2   Title Pt will report centralization of back pain, decreased 10-15% with functional mobility.    Status On-going   PT SHORT TERM GOAL #3   Title Pt will be complete Balance screen and set goal   Status On-going   PT SHORT TERM GOAL #4   Title Pt will be able to report increased ability to stand, walk due to less pain in knees (10-15% less)   Status On-going           PT Long Term Goals - 01/27/15 0950    PT LONG TERM GOAL #1   Title Pt will be I with HEP final for low back and knees   Status On-going   PT LONG TERM GOAL #2   Title Pt will improve balance (TBD)   Status Unable to assess   PT LONG TERM GOAL #3   Title Pt will be able to stand for  up to 15 min for light housework and pain less than 5/10 overall.    Status On-going   PT LONG TERM GOAL #4   Title Pt will be able to walk around [employer] for 30 min (with buggy) and report no more than mod pain in knees, back   Status On-going   PT LONG TERM GOAL #5   Title Pt will be I with posture and body mechanics concepts to prevent re-injury.    Status On-going   PT LONG TERM GOAL #6   Title Pt will demo 4+/5 ore more strength in knees to aid in safe ambulation, fall prevention   Status On-going      Patient reported 4/10 pain post treatment, which she is very pleased with.           Plan - 01/27/15 1005    Clinical Impression Statement Patient did not tolerate mat ex well due to pain in back and knee.  Sees knee MD Thursday.  No goals met, 2nd visit.  Encouraged safety and use of cane.    PT Next Visit Plan Merrilee Jansky, check in on HEP   PT Home Exercise Plan quad set, heel slide, LAQ, ab set, Lower trunk rot   Consulted and Agree with Plan of Care Patient      Repeat IFC please with heat.  Problem List Patient Active Problem List   Diagnosis Date Noted  . Tobacco abuse 07/19/2014  . Knee pain, chronic 03/13/2014  . Abnormal MRI, knee 03/13/2014  . Menorrhagia with irregular cycle 03/13/2014  . Iron deficiency anemia 03/12/2014    PAA,JENNIFER 01/27/2015, 10:06 AM  [facility] [address] [place], [place], [address] Phone: [phone]   Fax:  [phone]  Name: Andrea Montgomery MRN: [id number] Date of Birth: 12-May-1975    Raeford Razor, PT 01/27/2015 10:10 AM Phone: [phone] Fax: [phone]

## 2015-01-29 ENCOUNTER — Ambulatory Visit: Payer: Medicaid Other | Admitting: Physical Therapy

## 2015-02-03 ENCOUNTER — Ambulatory Visit: Payer: Medicaid Other

## 2015-02-05 ENCOUNTER — Ambulatory Visit (INDEPENDENT_AMBULATORY_CARE_PROVIDER_SITE_OTHER): Payer: Medicaid Other | Admitting: Obstetrics

## 2015-02-05 ENCOUNTER — Encounter: Payer: Self-pay | Admitting: Obstetrics

## 2015-02-05 VITALS — BP 134/91 | HR 83 | Temp 98.4°F | Wt 241.0 lb

## 2015-02-05 DIAGNOSIS — G8918 Other acute postprocedural pain: Secondary | ICD-10-CM

## 2015-02-05 DIAGNOSIS — N939 Abnormal uterine and vaginal bleeding, unspecified: Secondary | ICD-10-CM

## 2015-02-05 MED ORDER — OXYCODONE-ACETAMINOPHEN 10-325 MG PO TABS: 1.0000 | ORAL_TABLET | ORAL | Status: DC | PRN

## 2015-02-05 NOTE — Progress Notes (Signed)
Patient ID: Andrea Montgomery, female   DOB: May 24, 1975, 40 y.o.   MRN: [id number]  Chief Complaint  Patient presents with  . Follow-up    HPI Andrea Montgomery is a 40 y.o. female.  S/P HTA Endometrial Ablation ~ 3 months ago.  Still having periods that are shorter but still heavy.  Having severe cramping.  No bleeding currently.  Denies dysuria, N/V, but has had a problem with constipation. HPI  Past Medical History  Diagnosis Date  . Asthma   . COPD (chronic obstructive pulmonary disease) ([place])   . Diabetes mellitus   . Hypertension   . Iron deficiency anemia 03/12/2014  . Anxiety   . Emphysema of lung [facility])     Past Surgical History  Procedure Laterality Date  . Ceserean section times 2    . 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 Tubal ligation    . Dilitation & currettage/hystroscopy with hydrothermal ablation N/A 10/25/2014    Procedure: DILATATION & CURETTAGE/HYSTEROSCOPY WITH HYDROTHERMAL ABLATION;  Surgeon: Shelly Bombard, MD;  Location: [facility];  Service: Gynecology;  Laterality: N/A;    Family History  Problem Relation Age of Onset  . Hypertension Mother   . Heart disease Mother     Social History Social History  Substance Use Topics  . Smoking status: Current Every Day Smoker  . Smokeless tobacco: Never Used  . Alcohol Use: No     Comment: occ    No Known Allergies  Current Outpatient Prescriptions  Medication Sig Dispense Refill  . albuterol (PROVENTIL HFA;VENTOLIN HFA) 108 (90 BASE) MCG/ACT inhaler Inhale 2 puffs into the lungs every 6 (six) hours as needed for wheezing or shortness of breath.     . ALPRAZolam (XANAX) 1 MG tablet Take 1 mg by mouth 4 (four) times daily.    . Fluticasone-Salmeterol (ADVAIR DISKUS) 250-50 MCG/DOSE AEPB Inhale 1 puff into the lungs every 12 (twelve) hours. For shortness of breath    . furosemide (LASIX) 40 MG tablet Take 40 mg by mouth daily.    . insulin aspart protamine-insulin aspart (NOVOLOG 70/30) (70-30) 100 UNIT/ML injection  Inject 15-25 Units into the skin 2 (two) times daily with a meal. 15units in the morning and 25 units at night    . olmesartan (BENICAR) 5 MG tablet Take by mouth daily.    . clindamycin (CLEOCIN) 300 MG capsule Take 1 capsule (300 mg total) by mouth 3 (three) times daily. (Patient not taking: Reported on 01/20/2015) 30 capsule 0  . doxycycline (VIBRAMYCIN) 100 MG capsule Take 1 capsule (100 mg total) by mouth 2 (two) times daily. (Patient not taking: Reported on 01/20/2015) 28 capsule 1  . fluconazole (DIFLUCAN) 150 MG tablet Take 1 tablet (150 mg total) by mouth once. (Patient not taking: Reported on 02/05/2015) 1 tablet 2  . medroxyPROGESTERone (PROVERA) 10 MG tablet Take 2 tablets (20 mg total) by mouth daily. (Patient not taking: Reported on 02/05/2015) 60 tablet 0  . metroNIDAZOLE (FLAGYL) 500 MG tablet Take 1 tablet (500 mg total) by mouth 2 (two) times daily. (Patient not taking: Reported on 01/20/2015) 28 tablet 1  . metroNIDAZOLE (FLAGYL) 500 MG tablet Take 4 tablets po at once. TAKE WITH FOOD. (Patient not taking: Reported on 01/20/2015) 4 tablet 1  . Oxycodone HCl 10 MG TABS Take 1 tablet (10 mg total) by mouth every 6 (six) hours as needed. (Patient not taking: Reported on 02/05/2015) 40 tablet 0  . oxyCODONE-acetaminophen (PERCOCET) 10-325 MG tablet Take  1 tablet by mouth every 4 (four) hours as needed for pain. 40 tablet 0  . tinidazole (TINDAMAX) 500 MG tablet Take 4 tablets (2,000 mg total) by mouth daily. (Patient not taking: Reported on 01/20/2015) 8 tablet 0   No current facility-administered medications for this visit.    Review of Systems Review of Systems Constitutional: negative for fatigue and weight loss Respiratory: negative for cough and wheezing Cardiovascular: negative for chest pain, fatigue and palpitations Gastrointestinal: negative for abdominal pain and change in bowel habits Genitourinary: positive for shorter but still heavy periods since Endometrial  Ablation Integument/breast: negative for nipple discharge Musculoskeletal:negative for myalgias Neurological: negative for gait problems and tremors Behavioral/Psych: negative for abusive relationship, depression Endocrine: negative for temperature intolerance     Blood pressure 134/91, pulse 83, temperature 98.4 F (36.9 C), weight 241 lb (109.317 kg).  Physical Exam Physical Exam           General: Alert and no distress Abdomen:  normal findings: no organomegaly, soft, non-tender and no hernia  Pelvis:  External genitalia: normal general appearance Urinary system: urethral meatus normal and bladder without fullness, nontender Vaginal: normal without tenderness, induration or masses Cervix: normal appearance Adnexa: normal bimanual exam Uterus: anteverted and non-tender, normal size       Data Reviewed Labs  Assessment     S/P HTA Endometrial Ablation.  Stable but still some vaginal bleeding, less, which is probably normal.  Cramping is concerning.     Plan    Percocet Rx. F/U in 3 months   Orders Placed This Encounter  Procedures  . SureSwab, Vaginosis/Vaginitis Plus  . CBC    Standing Status: Future     Number of Occurrences:      Standing Expiration Date: 02/05/2016   Meds ordered this encounter  Medications  . oxyCODONE-acetaminophen (PERCOCET) 10-325 MG tablet    Sig: Take 1 tablet by mouth every 4 (four) hours as needed for pain.    Dispense:  40 tablet    Refill:  0

## 2015-02-06 ENCOUNTER — Ambulatory Visit: Payer: Medicaid Other | Attending: Orthopedic Surgery | Admitting: Physical Therapy

## 2015-02-06 DIAGNOSIS — M25561 Pain in right knee: Secondary | ICD-10-CM | POA: Diagnosis present

## 2015-02-06 DIAGNOSIS — M545 Low back pain: Secondary | ICD-10-CM | POA: Insufficient documentation

## 2015-02-06 DIAGNOSIS — R262 Difficulty in walking, not elsewhere classified: Secondary | ICD-10-CM | POA: Insufficient documentation

## 2015-02-06 DIAGNOSIS — Z7409 Other reduced mobility: Secondary | ICD-10-CM | POA: Diagnosis not present

## 2015-02-06 DIAGNOSIS — M25562 Pain in left knee: Secondary | ICD-10-CM | POA: Insufficient documentation

## 2015-02-06 DIAGNOSIS — R2681 Unsteadiness on feet: Secondary | ICD-10-CM | POA: Diagnosis present

## 2015-02-06 NOTE — Therapy (Addendum)
[place], [place], [address] Phone: [phone]   Fax:  [phone]  Physical Therapy Treatment/ Discharge Patient Details  Name: Andrea Montgomery MRN: [id number] Date of Birth: [birth year] Referring Provider: Marlou Sa   Encounter Date: 02/06/2015      PT End of Session - 02/06/15 1041    Visit Number 3   Number of Visits 16   Date for PT Re-Evaluation 03/17/15   PT Start Time 1019   PT Stop Time 1115   PT Time Calculation (min) 56 min   Activity Tolerance Patient limited by pain   Behavior During Therapy [facility] for tasks assessed/performed      Past Medical History  Diagnosis Date  . Asthma   . COPD (chronic obstructive pulmonary disease) ([place])   . Diabetes mellitus   . Hypertension   . Iron deficiency anemia 03/12/2014  . Anxiety   . Emphysema of lung [facility])     Past Surgical History  Procedure Laterality Date  . Ceserean section times 2    . 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 Tubal ligation    . Dilitation & currettage/hystroscopy with hydrothermal ablation N/A 10/25/2014    Procedure: DILATATION & CURETTAGE/HYSTEROSCOPY WITH HYDROTHERMAL ABLATION;  Surgeon: Shelly Bombard, MD;  Location: [facility];  Service: Gynecology;  Laterality: N/A;    There were no vitals filed for this visit.  Visit Diagnosis:  Decreased functional mobility and endurance  Pain in both knees  Midline low back pain, with sciatica presence unspecified  Difficulty walking  Gait instability      Subjective Assessment - 02/06/15 1020    Subjective I missed my last appt cause I fell Sunday. Rt. knee giving out.  I think I might need a walker.    Currently in Pain? Yes   Pain Score 7    Pain Location Knee   Pain Orientation Right   Pain Descriptors / Indicators Throbbing   Pain Type Chronic pain   Pain Onset More than a month ago   Pain Frequency Constant   Aggravating Factors  walking, standing    Pain Relieving Factors  meds   Pain Score 7   Pain Location Back   Pain Orientation Lower   Pain Type Chronic pain   Pain Onset More than a month ago   Pain Frequency Constant            [facility] PT Assessment - 02/06/15 0001    Strength   Right Knee Flexion 3/5   Right Knee Extension 3/5  pain              [facility] Adult PT Treatment/Exercise - 02/06/15 1034    Ambulation/Gait   Ambulation/Gait Yes   Ambulation/Gait Assistance 6: Modified independent (Device/Increase time)   Ambulation Distance (Feet) 50 Feet   Assistive device Rolling walker   Gait Comments pt was given a donated RW from our closet to ensure safety   Lumbar Exercises: Stretches   Single Knee to Chest Stretch 2 reps;30 seconds   Lower Trunk Rotation 5 reps;10 seconds   Pelvic Tilt 5 reps   Lumbar Exercises: Aerobic   Stationary Bike NuStep level 1 for 5 min    Knee/Hip Exercises: Supine   Short Arc Quad Sets Strengthening;Both;1 set;10 reps   Heel Slides AAROM;Right;1 set   Moist Heat Therapy   Number Minutes Moist Heat 15 Minutes   Moist Heat Location Lumbar Spine;Knee  Rt   Electrical Stimulation  Psychologist, forensic IFC   Electrical Stimulation Parameters to tol   Electrical Stimulation Goals Pain     seated hamstring stretch 30 [place] x 2 each   Pt moves slowly, needs increased time for transitions and gait in clinic.            PT Education - 02/06/15 1041    Education provided Yes   Education Details knee ther ex   Person(s) Educated Patient   Methods Explanation   Comprehension Verbalized understanding          PT Short Term Goals - 02/06/15 1045    PT SHORT TERM GOAL #1   Title Pt will be able to demo initial HEP with independence    Status On-going   PT SHORT TERM GOAL #2   Title Pt will report centralization of back pain, decreased 10-15% with functional mobility.    Status On-going   PT SHORT TERM GOAL #3   Title Pt will be complete Balance  screen and set goal   Status On-going   PT SHORT TERM GOAL #4   Title Pt will be able to report increased ability to stand, walk due to less pain in knees (10-15% less)   Status On-going           PT Long Term Goals - 02/06/15 1046    PT LONG TERM GOAL #1   Title Pt will be I with HEP final for low back and knees   Status On-going   PT LONG TERM GOAL #2   Title Pt will improve balance (TBD)   Status On-going   PT LONG TERM GOAL #3   Title Pt will be able to stand for up to 15 min for light housework and pain less than 5/10 overall.    Status On-going   PT LONG TERM GOAL #4   Title Pt will be able to walk around [employer] for 30 min (with buggy) and report no more than mod pain in knees, back   Status On-going   PT LONG TERM GOAL #5   Title Pt will be I with posture and body mechanics concepts to prevent re-injury.    Status On-going   PT LONG TERM GOAL #6   Title Pt will demo 4+/5 ore more strength in knees to aid in safe ambulation, fall prevention   Status On-going               Plan - 02/06/15 1305    Clinical Impression Statement Patient with 2 falls recently and complaints of Rt. knee buckling.  Question if this is coming from back as a more neurological issue. She is weaker in Rt. leg than on eval.  GAve walker for increased safety with gait.  She is in the process of moving to a house without steps.  Does feel better after tIFC. No goals met.    PT Next Visit Plan Merrilee Jansky, check in on HEP   PT Home Exercise Plan quad set, heel slide, LAQ, ab set, Lower trunk rot   Consulted and Agree with Plan of Care Patient        Problem List Patient Active Problem List   Diagnosis Date Noted  . Tobacco abuse 07/19/2014  . Knee pain, chronic 03/13/2014  . Abnormal MRI, knee 03/13/2014  . Menorrhagia with irregular cycle 03/13/2014  . Iron deficiency anemia 03/12/2014    Ayce Pietrzyk 02/06/2015, 1:06 PM  [facility]  [place], [place], [address] Phone: [phone]   Fax:  [phone]  Name: Andrea Montgomery MRN: [id number] Date of Birth: [birth year]    Raeford Razor, PT 02/06/2015 1:06 PM Phone: [phone] Fax: [phone]   PHYSICAL THERAPY DISCHARGE SUMMARY  Visits from Start of Care: 3  Current functional level related to goals / functional outcomes: See above    Remaining deficits: See above   Education / Equipment: HEP, posture and body mechanics  Plan: Patient agrees to discharge.  Patient goals were not met. Patient is being discharged due to not returning since the last visit.  ?????    4 no shows   Raeford Razor, PT 03/20/2015 2:43 PM Phone: [phone] Fax: [phone]

## 2015-02-09 LAB — SURESWAB, VAGINOSIS/VAGINITIS PLUS
ATOPOBIUM VAGINAE: DETECTED Log (cells/mL)
BV CATEGORY: UNDETERMINED — AB
C. ALBICANS, DNA: NOT DETECTED
C. TRACHOMATIS RNA, TMA: NOT DETECTED
C. glabrata, DNA: NOT DETECTED
C. parapsilosis, DNA: NOT DETECTED
C. tropicalis, DNA: NOT DETECTED
GARDNERELLA VAGINALIS: 5.8 Log (cells/mL)
LACTOBACILLUS SPECIES: 6.8 Log (cells/mL)
MEGASPHAERA SPECIES: NOT DETECTED Log (cells/mL)
N. gonorrhoeae RNA, TMA: NOT DETECTED
T. VAGINALIS RNA, QL TMA: DETECTED — AB

## 2015-02-10 ENCOUNTER — Other Ambulatory Visit: Payer: Self-pay | Admitting: Obstetrics

## 2015-02-10 DIAGNOSIS — B9689 Other specified bacterial agents as the cause of diseases classified elsewhere: Secondary | ICD-10-CM

## 2015-02-10 DIAGNOSIS — N760 Acute vaginitis: Secondary | ICD-10-CM

## 2015-02-10 DIAGNOSIS — A5901 Trichomonal vulvovaginitis: Secondary | ICD-10-CM

## 2015-02-10 MED ORDER — TINIDAZOLE 500 MG PO TABS: 2.0000 g | ORAL_TABLET | Freq: Every day | ORAL | Status: DC

## 2015-02-11 ENCOUNTER — Other Ambulatory Visit: Payer: Medicaid Other

## 2015-02-12 ENCOUNTER — Telehealth: Payer: Self-pay | Admitting: Hematology and Oncology

## 2015-02-12 ENCOUNTER — Other Ambulatory Visit: Payer: Self-pay | Admitting: *Deleted

## 2015-02-12 NOTE — Telephone Encounter (Signed)
Spoke with patient and confirmed lab appt for 2/9

## 2015-02-13 ENCOUNTER — Telehealth: Payer: Self-pay | Admitting: Hematology and Oncology

## 2015-02-13 ENCOUNTER — Other Ambulatory Visit: Payer: Self-pay | Admitting: Hematology and Oncology

## 2015-02-13 ENCOUNTER — Telehealth: Payer: Self-pay | Admitting: *Deleted

## 2015-02-13 ENCOUNTER — Other Ambulatory Visit (HOSPITAL_BASED_OUTPATIENT_CLINIC_OR_DEPARTMENT_OTHER): Payer: Medicaid Other

## 2015-02-13 DIAGNOSIS — D509 Iron deficiency anemia, unspecified: Secondary | ICD-10-CM | POA: Diagnosis not present

## 2015-02-13 LAB — CBC & DIFF AND RETIC
BASO%: 0.1 % (ref 0.0–2.0)
Basophils Absolute: 0 10*3/uL (ref 0.0–0.1)
EOS%: 1.4 % (ref 0.0–7.0)
Eosinophils Absolute: 0.2 10*3/uL (ref 0.0–0.5)
HEMATOCRIT: 43.6 % (ref 34.8–46.6)
HEMOGLOBIN: 13.7 g/dL (ref 11.6–15.9)
IMMATURE RETIC FRACT: 5.7 % (ref 1.60–10.00)
LYMPH%: 19.4 % (ref 14.0–49.7)
MCH: 27.5 pg (ref 25.1–34.0)
MCHC: 31.4 g/dL — ABNORMAL LOW (ref 31.5–36.0)
MCV: 87.6 fL (ref 79.5–101.0)
MONO#: 0.9 10*3/uL (ref 0.1–0.9)
MONO%: 7.6 % (ref 0.0–14.0)
NEUT#: 8 10*3/uL — ABNORMAL HIGH (ref 1.5–6.5)
NEUT%: 71.5 % (ref 38.4–76.8)
PLATELETS: 281 10*3/uL (ref 145–400)
RBC: 4.98 10*6/uL (ref 3.70–5.45)
RDW: 12.8 % (ref 11.2–14.5)
Retic %: 2.2 % — ABNORMAL HIGH (ref 0.70–2.10)
Retic Ct Abs: 109.56 10*3/uL — ABNORMAL HIGH (ref 33.70–90.70)
WBC: 11.2 10*3/uL — ABNORMAL HIGH (ref 3.9–10.3)
lymph#: 2.2 10*3/uL (ref 0.9–3.3)

## 2015-02-13 LAB — FERRITIN: FERRITIN: 83 ng/mL (ref 9–269)

## 2015-02-13 LAB — IRON AND TIBC
%SAT: 12 % — ABNORMAL LOW (ref 21–57)
Iron: 39 ug/dL — ABNORMAL LOW (ref 41–142)
TIBC: 321 ug/dL (ref 236–444)
UIBC: 283 ug/dL (ref 120–384)

## 2015-02-13 NOTE — Telephone Encounter (Signed)
Per staff message and POF I have scheduled appts. Advised scheduler of appts. JMW  

## 2015-02-13 NOTE — Telephone Encounter (Signed)
per pt req to mail copy of avs-mailed

## 2015-02-13 NOTE — Telephone Encounter (Signed)
Spoke with patient. OK to cancel appt next week and recheck in 3 months

## 2015-02-13 NOTE — Telephone Encounter (Signed)
-----   Message from Heath Lark, MD sent at 02/13/2015 10:32 AM EST ----- Regarding: labs Her labs are fine No need treatment I recommend cancelling her appt next week and recheck in 3 months Let me know if it's OK  ----- Message -----    From: [facility] in [facility] Interface    Sent: 02/13/2015  10:00 AM      To: Heath Lark, MD

## 2015-02-13 NOTE — Telephone Encounter (Signed)
per pof tos ch pt appt-sent MW email to sch trmt-will call pt after reply °

## 2015-02-17 ENCOUNTER — Ambulatory Visit: Payer: Medicaid Other | Admitting: Physical Therapy

## 2015-02-18 ENCOUNTER — Ambulatory Visit: Payer: Medicaid Other | Admitting: Hematology and Oncology

## 2015-02-18 ENCOUNTER — Ambulatory Visit: Payer: Medicaid Other

## 2015-02-21 ENCOUNTER — Ambulatory Visit: Payer: Medicaid Other | Admitting: Physical Therapy

## 2015-02-24 ENCOUNTER — Ambulatory Visit: Payer: Medicaid Other | Admitting: Physical Therapy

## 2015-02-25 ENCOUNTER — Ambulatory Visit: Payer: Medicaid Other

## 2015-02-28 ENCOUNTER — Telehealth: Payer: Self-pay | Admitting: Physical Therapy

## 2015-02-28 ENCOUNTER — Ambulatory Visit: Payer: Medicaid Other | Admitting: Physical Therapy

## 2015-02-28 NOTE — Telephone Encounter (Signed)
Left message regarding 4 missed appointments. Per attendance policy, will cancel all future appointments. Asked pt to call and reschedule one appointment if she wants to continue.

## 2015-03-03 ENCOUNTER — Encounter: Payer: Medicaid Other | Admitting: Physical Therapy

## 2015-03-05 ENCOUNTER — Encounter: Payer: Medicaid Other | Admitting: Physical Therapy

## 2015-03-10 ENCOUNTER — Encounter: Payer: Medicaid Other | Admitting: Physical Therapy

## 2015-03-12 ENCOUNTER — Encounter: Payer: Medicaid Other | Admitting: Physical Therapy

## 2015-04-13 ENCOUNTER — Encounter (HOSPITAL_COMMUNITY): Payer: Self-pay | Admitting: *Deleted

## 2015-04-13 ENCOUNTER — Emergency Department (HOSPITAL_COMMUNITY)
Admission: EM | Admit: 2015-04-13 | Discharge: 2015-04-13 | Disposition: A | Discharge status: Home / Self Care | Payer: Medicaid Other | Attending: Emergency Medicine | Admitting: Emergency Medicine

## 2015-04-13 DIAGNOSIS — F17200 Nicotine dependence, unspecified, uncomplicated: Secondary | ICD-10-CM | POA: Diagnosis not present

## 2015-04-13 DIAGNOSIS — I10 Essential (primary) hypertension: Secondary | ICD-10-CM | POA: Insufficient documentation

## 2015-04-13 DIAGNOSIS — J449 Chronic obstructive pulmonary disease, unspecified: Secondary | ICD-10-CM | POA: Insufficient documentation

## 2015-04-13 DIAGNOSIS — E119 Type 2 diabetes mellitus without complications: Secondary | ICD-10-CM | POA: Insufficient documentation

## 2015-04-13 DIAGNOSIS — R51 Headache: Secondary | ICD-10-CM | POA: Diagnosis present

## 2015-04-13 NOTE — ED Notes (Signed)
Patient called 3 times to be taken to a room - no answer

## 2015-04-13 NOTE — ED Notes (Signed)
The pt is c/o a headache for one hour  She has not taken any type pain med for it

## 2015-05-22 ENCOUNTER — Ambulatory Visit: Payer: Medicaid Other

## 2015-05-22 ENCOUNTER — Other Ambulatory Visit: Payer: Medicaid Other

## 2015-05-22 ENCOUNTER — Encounter: Payer: Self-pay | Admitting: Hematology and Oncology

## 2015-05-22 ENCOUNTER — Ambulatory Visit: Payer: Medicaid Other | Admitting: Hematology and Oncology

## 2015-05-22 ENCOUNTER — Encounter: Payer: Self-pay | Admitting: *Deleted

## 2015-05-22 NOTE — Progress Notes (Signed)
Pt did not show up for her appts today.   No Show letter from Dr. Alvy Bimler placed in outgoing mail to pt's home address.

## 2015-07-21 ENCOUNTER — Ambulatory Visit: Payer: Medicaid Other | Admitting: Obstetrics

## 2016-02-17 ENCOUNTER — Inpatient Hospital Stay (HOSPITAL_COMMUNITY)
Admission: AD | Admit: 2016-02-17 | Discharge: 2016-02-17 | Disposition: A | Discharge status: Home / Self Care | Payer: Medicaid Other | Source: Ambulatory Visit | Attending: Obstetrics and Gynecology | Admitting: Obstetrics and Gynecology

## 2016-02-17 ENCOUNTER — Ambulatory Visit (INDEPENDENT_AMBULATORY_CARE_PROVIDER_SITE_OTHER): Payer: Medicaid Other | Admitting: Obstetrics

## 2016-02-17 ENCOUNTER — Other Ambulatory Visit (HOSPITAL_COMMUNITY)
Admission: RE | Admit: 2016-02-17 | Discharge: 2016-02-17 | Disposition: A | Discharge status: Home / Self Care | Payer: Medicaid Other | Source: Ambulatory Visit | Attending: Obstetrics | Admitting: Obstetrics

## 2016-02-17 ENCOUNTER — Encounter: Payer: Self-pay | Admitting: Obstetrics

## 2016-02-17 VITALS — BP 162/108 | HR 82 | Wt 228.0 lb

## 2016-02-17 DIAGNOSIS — Z1151 Encounter for screening for human papillomavirus (HPV): Secondary | ICD-10-CM | POA: Diagnosis present

## 2016-02-17 DIAGNOSIS — Z113 Encounter for screening for infections with a predominantly sexual mode of transmission: Secondary | ICD-10-CM | POA: Diagnosis present

## 2016-02-17 DIAGNOSIS — Z124 Encounter for screening for malignant neoplasm of cervix: Secondary | ICD-10-CM

## 2016-02-17 DIAGNOSIS — Z01419 Encounter for gynecological examination (general) (routine) without abnormal findings: Secondary | ICD-10-CM | POA: Insufficient documentation

## 2016-02-17 DIAGNOSIS — J449 Chronic obstructive pulmonary disease, unspecified: Secondary | ICD-10-CM

## 2016-02-17 DIAGNOSIS — Z6841 Body Mass Index (BMI) 40.0 and over, adult: Secondary | ICD-10-CM | POA: Diagnosis not present

## 2016-02-17 DIAGNOSIS — N946 Dysmenorrhea, unspecified: Secondary | ICD-10-CM | POA: Diagnosis not present

## 2016-02-17 DIAGNOSIS — E669 Obesity, unspecified: Secondary | ICD-10-CM | POA: Diagnosis not present

## 2016-02-17 DIAGNOSIS — D649 Anemia, unspecified: Secondary | ICD-10-CM | POA: Insufficient documentation

## 2016-02-17 DIAGNOSIS — N939 Abnormal uterine and vaginal bleeding, unspecified: Secondary | ICD-10-CM | POA: Insufficient documentation

## 2016-02-17 DIAGNOSIS — F411 Generalized anxiety disorder: Secondary | ICD-10-CM

## 2016-02-17 DIAGNOSIS — Z0000 Encounter for general adult medical examination without abnormal findings: Secondary | ICD-10-CM

## 2016-02-17 DIAGNOSIS — F419 Anxiety disorder, unspecified: Secondary | ICD-10-CM | POA: Insufficient documentation

## 2016-02-17 DIAGNOSIS — Z9889 Other specified postprocedural states: Secondary | ICD-10-CM

## 2016-02-17 DIAGNOSIS — G8918 Other acute postprocedural pain: Secondary | ICD-10-CM

## 2016-02-17 DIAGNOSIS — Z01411 Encounter for gynecological examination (general) (routine) with abnormal findings: Secondary | ICD-10-CM

## 2016-02-17 DIAGNOSIS — J4550 Severe persistent asthma, uncomplicated: Secondary | ICD-10-CM

## 2016-02-17 MED ORDER — OXYCODONE-ACETAMINOPHEN 10-325 MG PO TABS: 1.0000 | ORAL_TABLET | ORAL | 0 refills | Status: DC | PRN

## 2016-02-17 MED ORDER — MEDROXYPROGESTERONE ACETATE 400 MG/ML IM SUSP
400.0000 mg | Freq: Once | INTRAMUSCULAR | Status: AC
  Administered 2016-02-17: 400 mg via INTRAMUSCULAR
  Filled 2016-02-17: qty 1

## 2016-02-17 NOTE — Discharge Instructions (Signed)
Return on 2/27, for your next injection

## 2016-02-17 NOTE — Progress Notes (Signed)
Patient reports she has been bleeding monthly since her ablation. She is having her cycle and it is heavier and more painful than it has ever been. Patient is having prolonged cycles- and she is still having to get transfusions because the bleeding has not stopped.

## 2016-02-18 ENCOUNTER — Encounter: Payer: Self-pay | Admitting: Obstetrics

## 2016-02-18 LAB — TSH: TSH: 1.08 u[IU]/mL (ref 0.450–4.500)

## 2016-02-18 LAB — CBC
Hematocrit: 40.1 % (ref 34.0–46.6)
Hemoglobin: 12.9 g/dL (ref 11.1–15.9)
MCH: 27.3 pg (ref 26.6–33.0)
MCHC: 32.2 g/dL (ref 31.5–35.7)
MCV: 85 fL (ref 79–97)
PLATELETS: 331 10*3/uL (ref 150–379)
RBC: 4.73 x10E6/uL (ref 3.77–5.28)
RDW: 13.8 % (ref 12.3–15.4)
WBC: 9.7 10*3/uL (ref 3.4–10.8)

## 2016-02-18 LAB — CYTOLOGY - PAP
DIAGNOSIS: NEGATIVE
HPV (WINDOPATH): NOT DETECTED

## 2016-02-18 LAB — COMPREHENSIVE METABOLIC PANEL
ALK PHOS: 58 IU/L (ref 39–117)
ALT: 21 IU/L (ref 0–32)
AST: 18 IU/L (ref 0–40)
Albumin/Globulin Ratio: 1.3 (ref 1.2–2.2)
Albumin: 4.1 g/dL (ref 3.5–5.5)
BILIRUBIN TOTAL: 0.3 mg/dL (ref 0.0–1.2)
BUN/Creatinine Ratio: 11 (ref 9–23)
BUN: 8 mg/dL (ref 6–20)
CHLORIDE: 99 mmol/L (ref 96–106)
CO2: 22 mmol/L (ref 18–29)
Calcium: 9.5 mg/dL (ref 8.7–10.2)
Creatinine, Ser: 0.73 mg/dL (ref 0.57–1.00)
GFR calc Af Amer: 120 mL/min/{1.73_m2} (ref >59)
GFR calc non Af Amer: 104 mL/min/{1.73_m2} (ref >59)
GLUCOSE: 204 mg/dL — AB (ref 65–99)
Globulin, Total: 3.1 g/dL (ref 1.5–4.5)
POTASSIUM: 4.3 mmol/L (ref 3.5–5.2)
Sodium: 142 mmol/L (ref 134–144)
Total Protein: 7.2 g/dL (ref 6.0–8.5)

## 2016-02-18 LAB — CERVICOVAGINAL ANCILLARY ONLY
BACTERIAL VAGINITIS: POSITIVE — AB
Candida vaginitis: NEGATIVE
Chlamydia: NEGATIVE
NEISSERIA GONORRHEA: NEGATIVE
TRICH (WINDOWPATH): POSITIVE — AB

## 2016-02-18 NOTE — Progress Notes (Signed)
Patient ID: [name], female   DOB: [birth year], 41 y.o.   MRN: [id number]  Chief Complaint  Patient presents with  . Gynecologic Exam    HPI Andrea Montgomery is a 41 y.o. female.   AUB after HTA Endometrial Ablation.  Offered Mirena IUD but patient refuses because sister had an IUD that was imbedded after migration.  The patient is very anxious.  She has no pain tolerance.  She has poor health with PMH of asthma, COPD, and obesity. HPI  Past Medical History:  Diagnosis Date  . Anxiety   . Asthma   . COPD (chronic obstructive pulmonary disease) ([place])   . Diabetes mellitus   . Emphysema of lung ([place])   . Hypertension   . Iron deficiency anemia 03/12/2014    Past Surgical History:  Procedure Laterality Date  . ceserean section times 2    . DILITATION & CURRETTAGE/HYSTROSCOPY WITH HYDROTHERMAL ABLATION N/A 10/25/2014   Procedure: DILATATION & CURETTAGE/HYSTEROSCOPY WITH HYDROTHERMAL ABLATION;  Surgeon: Shelly Bombard, MD;  Location: [facility];  Service: Gynecology;  Laterality: N/A;  . TUBAL LIGATION    . tumor removed from leg      Family History  Problem Relation Age of Onset  . Hypertension Mother   . Heart disease Mother     Social History Social History  Substance Use Topics  . Smoking status: Current Every Day Smoker    Types: Cigarettes  . Smokeless tobacco: Never Used     Comment: 2 cigarettes/day  . Alcohol use No     Comment: occ    No Known Allergies  Current Outpatient Prescriptions  Medication Sig Dispense Refill  . albuterol (PROVENTIL HFA;VENTOLIN HFA) 108 (90 BASE) MCG/ACT inhaler Inhale 2 puffs into the lungs every 6 (six) hours as needed for wheezing or shortness of breath.     . ALPRAZolam (XANAX) 1 MG tablet Take 1 mg by mouth 4 (four) times daily.    . Fluticasone-Salmeterol (ADVAIR DISKUS) 250-50 MCG/DOSE AEPB Inhale 1 puff into the lungs every 12 (twelve) hours. For shortness of breath    . furosemide (LASIX) 40 MG tablet Take 40 mg by mouth  daily.    . insulin aspart protamine-insulin aspart (NOVOLOG 70/30) (70-30) 100 UNIT/ML injection Inject 15-25 Units into the skin 2 (two) times daily with a meal. 15units in the morning and 25 units at night    . medroxyPROGESTERone (PROVERA) 10 MG tablet Take 2 tablets (20 mg total) by mouth daily. 60 tablet 0  . olmesartan (BENICAR) 5 MG tablet Take by mouth daily.    . fluconazole (DIFLUCAN) 150 MG tablet Take 1 tablet (150 mg total) by mouth once. (Patient not taking: Reported on 02/05/2015) 1 tablet 2  . Oxycodone HCl 10 MG TABS Take 1 tablet (10 mg total) by mouth every 6 (six) hours as needed. (Patient not taking: Reported on 02/05/2015) 40 tablet 0  . oxyCODONE-acetaminophen (PERCOCET) 10-325 MG tablet Take 1 tablet by mouth every 4 (four) hours as needed for pain. 30 tablet 0   No current facility-administered medications for this visit.     Review of Systems Review of Systems Constitutional: negative for fatigue and weight loss Respiratory: positive for cough and wheezing with asthma exacerbations  Cardiovascular: negative for chest pain, fatigue and palpitations Gastrointestinal: negative for abdominal pain and change in bowel habits Genitourinary:positive for AUB Integument/breast: negative for nipple discharge Musculoskeletal:negative for myalgias Neurological: negative for gait problems and tremors Behavioral/Psych: positive for anxiety, severe Endocrine:  negative for temperature intolerance      Blood pressure (!) 162/108, pulse 82, weight 228 lb (103.4 kg), last menstrual period 02/12/2016.  Physical Exam Physical Exam General:   alert  Skin:   no rash or abnormalities  Lungs:   clear to auscultation bilaterally  Heart:   regular rate and rhythm, S1, S2 normal, no murmur, click, rub or gallop  Breasts:   normal without suspicious masses, skin or nipple changes or axillary nodes  Abdomen:  normal findings: no organomegaly, soft, non-tender and no hernia  Pelvis:   External genitalia: normal general appearance Urinary system: urethral meatus normal and bladder without fullness, nontender Vaginal: normal without tenderness, induration or masses Cervix: normal appearance Adnexa: normal bimanual exam Uterus: anteverted and tender, normal size    50% of 20 min visit spent on counseling and coordination of care.    Data Reviewed Labs Ultrasound  Assessment     AUB Dysmenorrhea Anemia Anxiety Disorder Obesity    Plan     Sent to [facility] for high dose Depo Provera  Oxycodone Rx  F/U in 2 weeks   Orders Placed This Encounter  Procedures  . CBC  . Comprehensive metabolic panel  . TSH   Meds ordered this encounter  Medications  . oxyCODONE-acetaminophen (PERCOCET) 10-325 MG tablet    Sig: Take 1 tablet by mouth every 4 (four) hours as needed for pain.    Dispense:  30 tablet    Refill:  0         Patient ID: Andrea Montgomery, female   DOB: 07-Mar-1975, 41 y.o.   MRN: [id number]

## 2016-02-19 ENCOUNTER — Other Ambulatory Visit: Payer: Self-pay | Admitting: Obstetrics

## 2016-02-19 DIAGNOSIS — N739 Female pelvic inflammatory disease, unspecified: Secondary | ICD-10-CM

## 2016-02-19 DIAGNOSIS — A5901 Trichomonal vulvovaginitis: Secondary | ICD-10-CM

## 2016-02-19 MED ORDER — TINIDAZOLE 500 MG PO TABS: 2.0000 g | ORAL_TABLET | Freq: Once | ORAL | 0 refills | Status: DC

## 2016-02-19 MED ORDER — METRONIDAZOLE 500 MG PO TABS: 500.0000 mg | ORAL_TABLET | Freq: Two times a day (BID) | ORAL | 1 refills | Status: DC

## 2016-02-19 MED ORDER — DOXYCYCLINE HYCLATE 100 MG PO CAPS: 100.0000 mg | ORAL_CAPSULE | Freq: Two times a day (BID) | ORAL | 1 refills | Status: DC

## 2016-03-03 ENCOUNTER — Ambulatory Visit: Payer: Medicaid Other | Admitting: Obstetrics

## 2016-03-10 ENCOUNTER — Other Ambulatory Visit: Payer: Self-pay | Admitting: *Deleted

## 2016-03-10 DIAGNOSIS — A5901 Trichomonal vulvovaginitis: Secondary | ICD-10-CM

## 2016-03-10 DIAGNOSIS — N739 Female pelvic inflammatory disease, unspecified: Secondary | ICD-10-CM

## 2016-03-10 MED ORDER — TINIDAZOLE 500 MG PO TABS: 2.0000 g | ORAL_TABLET | Freq: Once | ORAL | 0 refills | Status: AC

## 2016-03-10 MED ORDER — DOXYCYCLINE HYCLATE 100 MG PO CAPS: 100.0000 mg | ORAL_CAPSULE | Freq: Two times a day (BID) | ORAL | 1 refills | Status: DC

## 2016-03-10 MED ORDER — METRONIDAZOLE 500 MG PO TABS: 500.0000 mg | ORAL_TABLET | Freq: Two times a day (BID) | ORAL | 1 refills | Status: DC

## 2016-03-12 ENCOUNTER — Telehealth: Payer: Self-pay | Admitting: Hematology and Oncology

## 2016-03-12 NOTE — Telephone Encounter (Signed)
Needs to make an appointment for abnormal bleeding   [phone]

## 2016-03-12 NOTE — Telephone Encounter (Signed)
She has many no-show appt The earliest I can see her is 1 month from now Please get scheduler to schedule 30 minutes visit 1 month from now

## 2016-03-15 ENCOUNTER — Encounter: Payer: Self-pay | Admitting: Obstetrics

## 2016-03-15 ENCOUNTER — Ambulatory Visit (INDEPENDENT_AMBULATORY_CARE_PROVIDER_SITE_OTHER): Payer: Medicaid Other | Admitting: Obstetrics

## 2016-03-15 VITALS — BP 147/102 | HR 72 | Ht 61.0 in | Wt 228.0 lb

## 2016-03-15 DIAGNOSIS — Z9889 Other specified postprocedural states: Secondary | ICD-10-CM

## 2016-03-15 DIAGNOSIS — N393 Stress incontinence (female) (male): Secondary | ICD-10-CM | POA: Diagnosis not present

## 2016-03-15 DIAGNOSIS — R102 Pelvic and perineal pain: Secondary | ICD-10-CM

## 2016-03-15 DIAGNOSIS — N939 Abnormal uterine and vaginal bleeding, unspecified: Secondary | ICD-10-CM | POA: Diagnosis not present

## 2016-03-15 DIAGNOSIS — G8929 Other chronic pain: Secondary | ICD-10-CM

## 2016-03-15 DIAGNOSIS — N946 Dysmenorrhea, unspecified: Secondary | ICD-10-CM

## 2016-03-15 MED ORDER — NORETHINDRONE ACETATE 5 MG PO TABS: 10.0000 mg | ORAL_TABLET | Freq: Every day | ORAL | 5 refills | Status: DC

## 2016-03-15 MED ORDER — OXYCODONE-ACETAMINOPHEN 10-325 MG PO TABS: 1.0000 | ORAL_TABLET | ORAL | 0 refills | Status: DC | PRN

## 2016-03-15 NOTE — Progress Notes (Signed)
F/U AUB Ablation 10/25/2014 Started bleeding 3 days ago - heavy flow with clots

## 2016-03-15 NOTE — Progress Notes (Signed)
Patient ID: Andrea Montgomery, female   DOB: 10/04/1975, 41 y.o.   MRN: [id number]  Chief Complaint  Patient presents with  . Dysmenorrhea    HPI Andrea Montgomery is a 41 y.o. female.  Heavy periods wi th clots and severe cramping.  S/P Endometrial Ablation in 2016. HPI  Past Medical History:  Diagnosis Date  . Anxiety   . Asthma   . COPD (chronic obstructive pulmonary disease) ([place])   . Diabetes mellitus   . Emphysema of lung ([place])   . Hypertension   . Iron deficiency anemia 03/12/2014    Past Surgical History:  Procedure Laterality Date  . ceserean section times 2    . DILITATION & CURRETTAGE/HYSTROSCOPY WITH HYDROTHERMAL ABLATION N/A 10/25/2014   Procedure: DILATATION & CURETTAGE/HYSTEROSCOPY WITH HYDROTHERMAL ABLATION;  Surgeon: Shelly Bombard, MD;  Location: [facility];  Service: Gynecology;  Laterality: N/A;  . TUBAL LIGATION    . tumor removed from leg      Family History  Problem Relation Age of Onset  . Hypertension Mother   . Heart disease Mother     Social History Social History  Substance Use Topics  . Smoking status: Current Every Day Smoker    Types: Cigarettes  . Smokeless tobacco: Never Used     Comment: 2 cigarettes/day  . Alcohol use No     Comment: occ    No Known Allergies  Current Outpatient Prescriptions  Medication Sig Dispense Refill  . albuterol (PROVENTIL HFA;VENTOLIN HFA) 108 (90 BASE) MCG/ACT inhaler Inhale 2 puffs into the lungs every 6 (six) hours as needed for wheezing or shortness of breath.     . ALPRAZolam (XANAX) 1 MG tablet Take 1 mg by mouth 4 (four) times daily.    Marland Kitchen doxycycline (VIBRAMYCIN) 100 MG capsule Take 1 capsule (100 mg total) by mouth 2 (two) times daily. 28 capsule 1  . fluconazole (DIFLUCAN) 150 MG tablet Take 1 tablet (150 mg total) by mouth once. 1 tablet 2  . Fluticasone-Salmeterol (ADVAIR DISKUS) 250-50 MCG/DOSE AEPB Inhale 1 puff into the lungs every 12 (twelve) hours. For shortness of breath    . furosemide  (LASIX) 40 MG tablet Take 40 mg by mouth daily.    . insulin aspart protamine-insulin aspart (NOVOLOG 70/30) (70-30) 100 UNIT/ML injection Inject 15-25 Units into the skin 2 (two) times daily with a meal. 15units in the morning and 25 units at night    . medroxyPROGESTERone (PROVERA) 10 MG tablet Take 2 tablets (20 mg total) by mouth daily. 60 tablet 0  . metroNIDAZOLE (FLAGYL) 500 MG tablet Take 1 tablet (500 mg total) by mouth 2 (two) times daily. 28 tablet 1  . olmesartan (BENICAR) 5 MG tablet Take by mouth daily.    . Oxycodone HCl 10 MG TABS Take 1 tablet (10 mg total) by mouth every 6 (six) hours as needed. 40 tablet 0  . oxyCODONE-acetaminophen (PERCOCET) 10-325 MG tablet Take 1 tablet by mouth every 4 (four) hours as needed for pain. 30 tablet 0  . norethindrone (AYGESTIN) 5 MG tablet Take 2 tablets (10 mg total) by mouth daily. 60 tablet 5   No current facility-administered medications for this visit.     Review of Systems Review of Systems Constitutional: negative for fatigue and weight loss Respiratory: negative for cough and wheezing Cardiovascular: negative for chest pain, fatigue and palpitations Gastrointestinal: negative for abdominal pain and change in bowel habits Genitourinary:positive for heavy and painful periods Integument/breast: negative for nipple discharge  Musculoskeletal:negative for myalgias Neurological: negative for gait problems and tremors Behavioral/Psych: positive for anxiety Endocrine: negative for temperature intolerance      Blood pressure (!) 147/102, pulse 72, height 5\' 1"  (1.549 m), weight 228 lb (103.4 kg), last menstrual period 03/12/2016.  Physical Exam Physical Exam  Deferred                                                                                                                                                             50% of 10 min visit spent on counseling and coordination of care.    Data Reviewed CBC  Assessment     AUB -  Hormonal Imbalance Severe Dysmenorrhea Anxiety SUI    Plan    Continue with High Dose Depo Provera, followed by Aygestin maintenance Follow up in 3 months Referred to Urogynecology for SUI and Chronic Pelvic Pain  No orders of the defined types were placed in this encounter.  Meds ordered this encounter  Medications  . oxyCODONE-acetaminophen (PERCOCET) 10-325 MG tablet    Sig: Take 1 tablet by mouth every 4 (four) hours as needed for pain.    Dispense:  30 tablet    Refill:  0  . norethindrone (AYGESTIN) 5 MG tablet    Sig: Take 2 tablets (10 mg total) by mouth daily.    Dispense:  60 tablet    Refill:  5

## 2016-03-16 ENCOUNTER — Telehealth: Payer: Self-pay | Admitting: Hematology and Oncology

## 2016-03-16 NOTE — Telephone Encounter (Signed)
sw pt to confirm April appt. Pt needed morning appt due to dtr being in school. Gave pt 4/12 at 11 per request

## 2016-03-17 ENCOUNTER — Emergency Department (HOSPITAL_COMMUNITY)
Admission: EM | Admit: 2016-03-17 | Discharge: 2016-03-17 | Disposition: A | Discharge status: Home / Self Care | Payer: Medicaid Other | Attending: Emergency Medicine | Admitting: Emergency Medicine

## 2016-03-17 ENCOUNTER — Emergency Department (HOSPITAL_COMMUNITY): Payer: Medicaid Other

## 2016-03-17 ENCOUNTER — Encounter (HOSPITAL_COMMUNITY): Payer: Self-pay | Admitting: Emergency Medicine

## 2016-03-17 DIAGNOSIS — Y939 Activity, unspecified: Secondary | ICD-10-CM | POA: Diagnosis not present

## 2016-03-17 DIAGNOSIS — S83402A Sprain of unspecified collateral ligament of left knee, initial encounter: Secondary | ICD-10-CM | POA: Diagnosis not present

## 2016-03-17 DIAGNOSIS — Z79899 Other long term (current) drug therapy: Secondary | ICD-10-CM | POA: Diagnosis not present

## 2016-03-17 DIAGNOSIS — Y999 Unspecified external cause status: Secondary | ICD-10-CM | POA: Diagnosis not present

## 2016-03-17 DIAGNOSIS — I10 Essential (primary) hypertension: Secondary | ICD-10-CM | POA: Insufficient documentation

## 2016-03-17 DIAGNOSIS — F17210 Nicotine dependence, cigarettes, uncomplicated: Secondary | ICD-10-CM | POA: Insufficient documentation

## 2016-03-17 DIAGNOSIS — J449 Chronic obstructive pulmonary disease, unspecified: Secondary | ICD-10-CM | POA: Diagnosis not present

## 2016-03-17 DIAGNOSIS — S93492A Sprain of other ligament of left ankle, initial encounter: Secondary | ICD-10-CM

## 2016-03-17 DIAGNOSIS — W109XXA Fall (on) (from) unspecified stairs and steps, initial encounter: Secondary | ICD-10-CM | POA: Insufficient documentation

## 2016-03-17 DIAGNOSIS — Y929 Unspecified place or not applicable: Secondary | ICD-10-CM | POA: Insufficient documentation

## 2016-03-17 DIAGNOSIS — S8992XA Unspecified injury of left lower leg, initial encounter: Secondary | ICD-10-CM | POA: Diagnosis present

## 2016-03-17 MED ORDER — CYCLOBENZAPRINE HCL 10 MG PO TABS: 10.0000 mg | ORAL_TABLET | Freq: Two times a day (BID) | ORAL | 0 refills | Status: DC | PRN

## 2016-03-17 MED ORDER — IBUPROFEN 800 MG PO TABS: 800.0000 mg | ORAL_TABLET | Freq: Three times a day (TID) | ORAL | 0 refills | Status: DC

## 2016-03-17 NOTE — ED Notes (Signed)
Bed: WA19 Expected date:  Expected time:  Means of arrival:  Comments: 

## 2016-03-17 NOTE — ED Triage Notes (Signed)
Patient states she fell down steps yesterday. Patient is complaining of left knee/leg/foot pain. States she is unable to bear weight on this extremity.

## 2016-03-17 NOTE — ED Provider Notes (Signed)
[facility] DEPT Provider Note   CSN: [id number] Arrival date & time: 03/17/16  0831     History   Chief Complaint Chief Complaint  Patient presents with  . Knee Pain    Left    HPI Andrea Montgomery is a 41 y.o. female.  HPI   41 year old obese female with history of diabetes, hypertension, anemia presenting for evaluation of left leg injury. Patient states it's no yesterday, she was walking outside of her house down the steps and after taking the first step with her right foot, her left foot slipped back under her. She denies hitting her head or loss of consciousness but report acute onset of pain primarily to her left knee and her left foot. She described pain as a sharp sensation, shooting up her leg with movement or with palpation. She has been using a walker and haven't been any weight on her foot. She took ibuprofen last night but this morning she noticed moderate amount of swelling to her foot and leg and was concerned. She denies any significant back pain, no precipitating symptoms prior to the fall, no new numbness. She did hit her right elbow but denies any significant pain.    Past Medical History:  Diagnosis Date  . Anxiety   . Asthma   . COPD (chronic obstructive pulmonary disease) ([place])   . Diabetes mellitus   . Emphysema of lung ([place])   . Hypertension   . Iron deficiency anemia 03/12/2014    Patient Active Problem List   Diagnosis Date Noted  . Tobacco abuse 07/19/2014  . Knee pain, chronic 03/13/2014  . Abnormal MRI, knee 03/13/2014  . Menorrhagia with irregular cycle 03/13/2014  . Iron deficiency anemia 03/12/2014    Past Surgical History:  Procedure Laterality Date  . ceserean section times 2    . DILITATION & CURRETTAGE/HYSTROSCOPY WITH HYDROTHERMAL ABLATION N/A 10/25/2014   Procedure: DILATATION & CURETTAGE/HYSTEROSCOPY WITH HYDROTHERMAL ABLATION;  Surgeon: Shelly Bombard, MD;  Location: [facility];  Service: Gynecology;  Laterality: N/A;  . TUBAL  LIGATION    . 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 Authorizing Provider  albuterol (PROVENTIL HFA;VENTOLIN HFA) 108 (90 BASE) MCG/ACT inhaler Inhale 2 puffs into the lungs every 6 (six) hours as needed for wheezing or shortness of breath.     Historical Provider, MD  ALPRAZolam Duanne Moron) 1 MG tablet Take 1 mg by mouth 4 (four) times daily.    Historical Provider, MD  doxycycline (VIBRAMYCIN) 100 MG capsule Take 1 capsule (100 mg total) by mouth 2 (two) times daily. 03/10/16   Shelly Bombard, MD  fluconazole (DIFLUCAN) 150 MG tablet Take 1 tablet (150 mg total) by mouth once. 01/02/15   Shelly Bombard, MD  Fluticasone-Salmeterol (ADVAIR DISKUS) 250-50 MCG/DOSE AEPB Inhale 1 puff into the lungs every 12 (twelve) hours. For shortness of breath    Historical Provider, MD  furosemide (LASIX) 40 MG tablet Take 40 mg by mouth daily.    Historical Provider, MD  insulin aspart protamine-insulin aspart (NOVOLOG 70/30) (70-30) 100 UNIT/ML injection Inject 15-25 Units into the skin 2 (two) times daily with a meal. 15units in the morning and 25 units at night    Historical Provider, MD  medroxyPROGESTERone (PROVERA) 10 MG tablet Take 2 tablets (20 mg total) by  mouth daily. 09/11/14   Shelly Bombard, MD  metroNIDAZOLE (FLAGYL) 500 MG tablet Take 1 tablet (500 mg total) by mouth 2 (two) times daily. 03/10/16   Shelly Bombard, MD  norethindrone (AYGESTIN) 5 MG tablet Take 2 tablets (10 mg total) by mouth daily. 03/15/16   Shelly Bombard, MD  olmesartan (BENICAR) 5 MG tablet Take by mouth daily.    Historical Provider, MD  Oxycodone HCl 10 MG TABS Take 1 tablet (10 mg total) by mouth every 6 (six) hours as needed. 11/27/14   Shelly Bombard, MD  oxyCODONE-acetaminophen (PERCOCET) 10-325 MG tablet Take 1 tablet by mouth every 4 (four) hours as needed for pain. 03/15/16   Shelly Bombard, MD    Family History Family History  Problem Relation Age of Onset  . Hypertension Mother   . Heart disease Mother     Social History Social History  Substance Use Topics  . Smoking status: Current Every Day Smoker    Types: Cigarettes  . Smokeless tobacco: Never Used     Comment: 2 cigarettes/day  . Alcohol use No     Comment: occ     Allergies   Patient has no known allergies.   Review of Systems Review of Systems  Constitutional: Negative for fever.  Musculoskeletal: Positive for arthralgias. Negative for back pain.  Skin: Negative for rash and wound.  Neurological: Negative for numbness.     Physical Exam Updated Vital Signs BP 107/68 (BP Location: Left Arm)   Pulse 89   Temp 98 F (36.7 C) (Oral)   Resp 18   Ht 5\' 1"  (1.549 m)   Wt 103.4 kg   LMP 03/12/2016   SpO2 94%   BMI 43.08 kg/m   Physical Exam  Constitutional: She appears well-developed and well-nourished. No distress.  Obese female laying in bed in no acute discomfort.  HENT:  Head: Atraumatic.  Eyes: Conjunctivae are normal.  Neck: Neck supple.  Cardiovascular: Intact distal pulses.   Abdominal: Soft. There is no tenderness.  Musculoskeletal: She exhibits tenderness (Left lower extremity: Diffuse tenderness throughout the left lower leg from knee to her foot with gentle palpation but without any deformity noted. Edema noted to lateral malleolus region tender to palpation no crepitus. Decreased knee flexion extension).  Left leg compartment is soft with normal skin tone.  Neurological: She is alert.  Skin: No rash noted.  Psychiatric: She has a normal mood and affect.  Nursing note and vitals reviewed.    ED Treatments / Results  Labs (all labs ordered are listed, but only abnormal results are displayed) Labs Reviewed - No data to display  EKG  EKG Interpretation None       Radiology Dg Tibia/fibula Left  Result Date: 03/17/2016 CLINICAL DATA:  Pain following fall  EXAM: LEFT TIBIA AND FIBULA - 2 VIEW COMPARISON:  None. FINDINGS: Frontal and lateral views were obtained. No fracture or dislocation. Joint spaces appear normal. No abnormal periosteal reaction. There are small posterior and inferior calcaneal spurs. IMPRESSION: No fracture or dislocation. No apparent arthropathic change. There are small calcaneal spurs. Electronically Signed   By: Lowella Grip III M.D.   On: 03/17/2016 09:18   Dg Knee Complete 4 Views Left  Result Date: 03/17/2016 CLINICAL DATA:  Pain following fall EXAM: LEFT KNEE - COMPLETE 4+ VIEW COMPARISON:  Left knee MRI September 11, 2006 FINDINGS: Frontal, lateral, and bilateral oblique views were obtained. There is no fracture or dislocation. No joint effusion.  The joint spaces appear normal. No erosive change. IMPRESSION: No fracture or dislocation.  No evident arthropathy. Electronically Signed   By: Lowella Grip III M.D.   On: 03/17/2016 09:17   Dg Foot Complete Left  Result Date: 03/17/2016 CLINICAL DATA:  Pain following fall EXAM: LEFT FOOT - COMPLETE 3+ VIEW COMPARISON:  None. FINDINGS: Frontal, oblique, and lateral views were obtained. There is mild soft tissue swelling. No evident fracture or dislocation. Joint spaces appear normal. No erosive change. There are small posterior and inferior calcaneal spurs. IMPRESSION: Mild soft tissue swelling. Small calcaneal spurs. No fracture or dislocation. No appreciable joint space narrowing or erosion. Electronically Signed   By: Lowella Grip III M.D.   On: 03/17/2016 09:19    Procedures Procedures (including critical care time)  Medications Ordered in ED Medications - No data to display   Initial Impression / Assessment and Plan / ED Course  I have reviewed the triage vital signs and the nursing notes.  Pertinent labs & imaging results that were available during my care of the patient were reviewed by me and considered in my medical decision making (see chart for  details).     BP 107/68 (BP Location: Left Arm)   Pulse 89   Temp 98 F (36.7 C) (Oral)   Resp 18   Ht 5\' 1"  (1.549 m)   Wt 103.4 kg   LMP 03/12/2016   SpO2 94%   BMI 43.08 kg/m    Final Clinical Impressions(s) / ED Diagnoses   Final diagnoses:  Sprain of collateral ligament of left knee, initial encounter  Sprain of anterior talofibular ligament of left ankle, initial encounter    New Prescriptions New Prescriptions   CYCLOBENZAPRINE (FLEXERIL) 10 MG TABLET    Take 1 tablet (10 mg total) by mouth 2 (two) times daily as needed for muscle spasms.   IBUPROFEN (ADVIL,MOTRIN) 800 MG TABLET    Take 1 tablet (800 mg total) by mouth 3 (three) times daily.   10:11 AM Pt here with mechanical injury of her LLE.  Xray of L knee/tibfib/foot without acute fr/dislocation. Suspect sprain. RICE therapy discussed.  ACE wrap and ASO ankle brace provided.  Pt has a walker that she will use as needed.  Her orthopedist is Dr. Marlou Sa.    Domenic Moras, PA-C 03/17/16 [place], MD 03/17/16 [phone]

## 2016-04-12 ENCOUNTER — Ambulatory Visit: Payer: Medicaid Other | Admitting: Hematology and Oncology

## 2016-04-14 ENCOUNTER — Encounter (INDEPENDENT_AMBULATORY_CARE_PROVIDER_SITE_OTHER): Payer: Self-pay | Admitting: Orthopedic Surgery

## 2016-04-14 ENCOUNTER — Ambulatory Visit (INDEPENDENT_AMBULATORY_CARE_PROVIDER_SITE_OTHER): Payer: Medicaid Other | Admitting: Orthopedic Surgery

## 2016-04-14 DIAGNOSIS — M25562 Pain in left knee: Secondary | ICD-10-CM | POA: Diagnosis not present

## 2016-04-14 DIAGNOSIS — M25572 Pain in left ankle and joints of left foot: Secondary | ICD-10-CM

## 2016-04-14 NOTE — Progress Notes (Signed)
Office Visit Note   Patient: Andrea Montgomery           Date of Birth: [birth year]           MRN: [id number] Visit Date: 04/14/2016 Requested by: Vincente Liberty, MD [address] [place], [place] [address] PCP: Leola Brazil, MD  Subjective: Chief Complaint  Patient presents with  . Left Knee - Injury  . Left Ankle - Injury    HPI: Patient is a 41 year old with left ankle and left knee pain.  She fell 2 weeks ago.  She states her left knee buckled and she fell down 6 steps.  This was a hyperflexion type injury.  Reports some soreness in her ankle.  She is a good ankle brace for that problem.  She is taking Norco for pain because she is in pain management.  She was on Norco at the time of this injury which may exacerbate her symptoms currently.  She reports some swelling in left foot and ankle as well as a popping and pulling in the knee.  She had prior left knee surgery in 2010 which is for some type of tumor removal at [facility].  She ambulates with a cane.              ROS: All systems reviewed are negative as they relate to the chief complaint within the history of present illness.  Patient denies  fevers or chills.   Assessment & Plan: Visit Diagnoses:  1. Left knee pain, unspecified chronicity   2. Pain in left ankle and joints of left foot     Plan: Impression is left knee hyperflexion injury with mechanism of injury potentially consistent with meniscal pathology or chondral defect.  She does have some effusion in her knee.  Ligaments feel stable but it was a very difficult examination today due to guarding.  Only get her new Ace wrap and we need MRI of that left knee to evaluate for meniscal tear.  I'll see her back after that study.  Index of suspicion is relatively high based on mechanism of injury and very guarded examination today.  Follow-Up Instructions: Return for after MRI.   Orders:  Orders Placed This Encounter  Procedures  . MR  Knee Left w/o contrast   No orders of the defined types were placed in this encounter.     Procedures: No procedures performed   Clinical Data: No additional findings.  Objective: Vital Signs: There were no vitals taken for this visit.  Physical Exam:   Constitutional: Patient appears well-developed HEENT:  Head: Normocephalic Eyes:EOM are normal Neck: Normal range of motion Cardiovascular: Normal rate Pulmonary/chest: Effort normal Neurologic: Patient is alert Skin: Skin is warm Psychiatric: Patient has normal mood and affect   Ortho Exam: Orthopedic exam demonstrates increased body mass index antalgic gait to the left left ankle demonstrates palpable intact nontender anterior to posterior tib peroneal and Achilles tendon with some tenderness over the ATFL and CFL.  Pedal pulses are intact.  Ankle dorsi flexion plantar flexion is intact.  Left knee is examined.  Mild effusion is present.  Collateral cruciate ligaments are stable.  No other masses lymph adenopathy or skin changes noted in the left knee region.  Extensor mechanism is nontender.  Medial and lateral joint line tenderness is present.  No bruising is noted around the left knee region  Specialty Comments:  No specialty comments available.  Imaging: No results found.   PMFS History: Patient  Active Problem List   Diagnosis Date Noted  . Tobacco abuse 07/19/2014  . Knee pain, chronic 03/13/2014  . Abnormal MRI, knee 03/13/2014  . Menorrhagia with irregular cycle 03/13/2014  . Iron deficiency anemia 03/12/2014   Past Medical History:  Diagnosis Date  . Anxiety   . Asthma   . COPD (chronic obstructive pulmonary disease) ([place])   . Diabetes mellitus   . Emphysema of lung ([place])   . Hypertension   . Iron deficiency anemia 03/12/2014    Family History  Problem Relation Age of Onset  . Hypertension Mother   . Heart disease Mother     Past Surgical History:  Procedure Laterality Date  . ceserean section  times 2    . DILITATION & CURRETTAGE/HYSTROSCOPY WITH HYDROTHERMAL ABLATION N/A 10/25/2014   Procedure: DILATATION & CURETTAGE/HYSTEROSCOPY WITH HYDROTHERMAL ABLATION;  Surgeon: Shelly Bombard, MD;  Location: [facility];  Service: Gynecology;  Laterality: N/A;  . TUBAL LIGATION    . 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 Not on file.   Social History Main Topics  . Smoking status: Current Every Day Smoker    Types: Cigarettes  . Smokeless tobacco: Never Used     Comment: 2 cigarettes/day  . Alcohol use No     Comment: occ  . Drug use: No  . Sexual activity: Yes    Partners: Male    Birth control/ protection: None, Surgical

## 2016-04-15 ENCOUNTER — Encounter: Payer: Self-pay | Admitting: Hematology and Oncology

## 2016-04-15 ENCOUNTER — Telehealth: Payer: Self-pay

## 2016-04-15 ENCOUNTER — Ambulatory Visit (HOSPITAL_BASED_OUTPATIENT_CLINIC_OR_DEPARTMENT_OTHER): Payer: Medicaid Other

## 2016-04-15 ENCOUNTER — Telehealth: Payer: Self-pay | Admitting: Hematology and Oncology

## 2016-04-15 ENCOUNTER — Ambulatory Visit (HOSPITAL_BASED_OUTPATIENT_CLINIC_OR_DEPARTMENT_OTHER): Payer: Medicaid Other | Admitting: Hematology and Oncology

## 2016-04-15 VITALS — BP 111/62 | HR 87 | Temp 98.8°F | Resp 18 | Ht 61.0 in | Wt 230.7 lb

## 2016-04-15 DIAGNOSIS — N921 Excessive and frequent menstruation with irregular cycle: Secondary | ICD-10-CM | POA: Diagnosis not present

## 2016-04-15 DIAGNOSIS — D509 Iron deficiency anemia, unspecified: Secondary | ICD-10-CM

## 2016-04-15 DIAGNOSIS — Z720 Tobacco use: Secondary | ICD-10-CM | POA: Diagnosis not present

## 2016-04-15 LAB — CBC & DIFF AND RETIC
BASO%: 0.2 % (ref 0.0–2.0)
Basophils Absolute: 0 10*3/uL (ref 0.0–0.1)
EOS ABS: 0.2 10*3/uL (ref 0.0–0.5)
EOS%: 1.4 % (ref 0.0–7.0)
HCT: 40.9 % (ref 34.8–46.6)
HEMOGLOBIN: 13 g/dL (ref 11.6–15.9)
IMMATURE RETIC FRACT: 5 % (ref 1.60–10.00)
LYMPH%: 25 % (ref 14.0–49.7)
MCH: 26.7 pg (ref 25.1–34.0)
MCHC: 31.8 g/dL (ref 31.5–36.0)
MCV: 84.2 fL (ref 79.5–101.0)
MONO#: 0.7 10*3/uL (ref 0.1–0.9)
MONO%: 5.9 % (ref 0.0–14.0)
NEUT#: 7.5 10*3/uL — ABNORMAL HIGH (ref 1.5–6.5)
NEUT%: 67.5 % (ref 38.4–76.8)
Platelets: 312 10*3/uL (ref 145–400)
RBC: 4.86 10*6/uL (ref 3.70–5.45)
RDW: 13.4 % (ref 11.2–14.5)
Retic %: 1.75 % (ref 0.70–2.10)
Retic Ct Abs: 85.05 10*3/uL (ref 33.70–90.70)
WBC: 11.1 10*3/uL — ABNORMAL HIGH (ref 3.9–10.3)
lymph#: 2.8 10*3/uL (ref 0.9–3.3)

## 2016-04-15 LAB — PROTIME-INR
INR: 1.1 — ABNORMAL LOW (ref 2.00–3.50)
Protime: 13.2 Seconds (ref 10.6–13.4)

## 2016-04-15 LAB — FERRITIN: Ferritin: 53 ng/ml (ref 9–269)

## 2016-04-15 NOTE — Assessment & Plan Note (Signed)
I spent some time counseling the patient the importance of tobacco cessation. We discussed common strategies including nicotine patches, Tobacco Quit-line, and other nicotine replacement products to assist in hereffort to quit  she appears motivated to quit on her own.  

## 2016-04-15 NOTE — Telephone Encounter (Signed)
-----   Message from Heath Lark, MD sent at 04/15/2016  1:11 PM EDT ----- Regarding: labs PLs let her know labs are stable She does not need IV iron treatment ----- Message ----- From: Interface, Lab In Three Zero One Sent: 04/15/2016  12:00 PM To: Heath Lark, MD

## 2016-04-15 NOTE — Assessment & Plan Note (Addendum)
She follows closely with GI and She takes NSAID 3 times a day for joint pain. I recommend she refrain from taking NSAID and switch to Tylenol only I will check coagulation studies The patient is interested to proceed with hysterectomy.  I would defer to her GYN doctor to  discusse this with the patient

## 2016-04-15 NOTE — Assessment & Plan Note (Signed)
The last time she received intravenous iron infusion was in 2016. Her last CBC from February 2018 show no signs of anemia. However, she is symptomatic. I will check blood count and iron studies. I will call patient with test results. If she have recurrence of iron deficiency anemia, we will proceed with intravenous iron treatment. I recommend she stops taking NSAID that could contribute to menorrhagia

## 2016-04-15 NOTE — Progress Notes (Signed)
[facility] OFFICE PROGRESS NOTE  KILPATRICK JR,GEORGE R, MD SUMMARY OF HEMATOLOGIC HISTORY:  She have chronic knee pain for over 8 months. The right knee bother her more than the left knee she has been taking a lot of ibuprofen. The MRI suggested some bone marrow abnormality changes. I do not have a copy of her most recent blood work. Her last CBC from 2013 show hemoglobin of 10 with low MCV.  She denies recent chest pain on exertion, shortness of breath on minimal exertion, pre-syncopal episodes, or palpitations. However, she had profound fatigue and leg cramps. She had not noticed any recent bleeding such as epistaxis, hematuria or melena. She have heavy menstruation and she would bleed 10 days of her cycle of every 30 days with passage of large amount of clots. She has received multiple doses of IV iron in 2016 for iron deficiency anemia INTERVAL HISTORY: Andrea Montgomery 41 y.o. female returns for further follow-up. She complained of excessive fatigue, leg cramps and shortness of breath. She continues to smoke, attempting to quit. She has significant menorrhagia.  Last month along, she has been bleeding for 15 days She takes regular NSAID for joint pain The patient denies any recent signs or symptoms of bleeding such as spontaneous epistaxis, hematuria or hematochezia. She denies pica  I have reviewed the past medical history, past surgical history, social history and family history with the patient and they are unchanged from previous note.  ALLERGIES:  has No Known Allergies.  MEDICATIONS:  Current Outpatient Prescriptions  Medication Sig Dispense Refill  . albuterol (PROVENTIL HFA;VENTOLIN HFA) 108 (90 BASE) MCG/ACT inhaler Inhale 2 puffs into the lungs every 6 (six) hours as needed for wheezing or shortness of breath.     . ALPRAZolam (XANAX) 1 MG tablet Take 1 mg by mouth 4 (four) times daily.    . Fluticasone-Salmeterol (ADVAIR DISKUS) 250-50 MCG/DOSE AEPB Inhale  1 puff into the lungs every 12 (twelve) hours. For shortness of breath    . furosemide (LASIX) 40 MG tablet Take 40 mg by mouth daily.    Marland Kitchen ibuprofen (ADVIL,MOTRIN) 800 MG tablet Take 1 tablet (800 mg total) by mouth 3 (three) times daily. 21 tablet 0  . insulin aspart protamine-insulin aspart (NOVOLOG 70/30) (70-30) 100 UNIT/ML injection Inject 15-25 Units into the skin 2 (two) times daily with a meal. 15units in the morning and 25 units at night    . norethindrone (AYGESTIN) 5 MG tablet Take 2 tablets (10 mg total) by mouth daily. 60 tablet 5  . olmesartan (BENICAR) 5 MG tablet Take by mouth daily.    . cyclobenzaprine (FLEXERIL) 10 MG tablet Take 1 tablet (10 mg total) by mouth 2 (two) times daily as needed for muscle spasms. (Patient not taking: Reported on 04/15/2016) 20 tablet 0  . medroxyPROGESTERone (PROVERA) 10 MG tablet Take 2 tablets (20 mg total) by mouth daily. (Patient not taking: Reported on 04/15/2016) 60 tablet 0   No current facility-administered medications for this visit.      REVIEW OF SYSTEMS:   Constitutional: Denies fevers, chills or night sweats Eyes: Denies blurriness of vision Ears, nose, mouth, throat, and face: Denies mucositis or sore throat Respiratory: Denies cough, dyspnea or wheezes Cardiovascular: Denies palpitation, chest discomfort or lower extremity swelling Gastrointestinal:  Denies nausea, heartburn or change in bowel habits Skin: Denies abnormal skin rashes Lymphatics: Denies new lymphadenopathy or easy bruising Neurological:Denies numbness, tingling or new weaknesses Behavioral/Psych: Mood is stable, no new changes  All other systems were reviewed with the patient and are negative.  PHYSICAL EXAMINATION: ECOG PERFORMANCE STATUS: 1 - Symptomatic but completely ambulatory  Vitals:   04/15/16 1110  BP: 111/62  Pulse: 87  Resp: 18  Temp: 98.8 F (37.1 C)   Filed Weights   04/15/16 1110  Weight: 230 lb 11.2 oz (104.6 kg)    GENERAL:alert, no  distress and comfortable.  She is obese SKIN: skin color, texture, turgor are normal, no rashes or significant lesions EYES: normal, Conjunctiva are pink and non-injected, sclera clear OROPHARYNX:no exudate, no erythema and lips, buccal mucosa, and tongue normal  NECK: supple, thyroid normal size, non-tender, without nodularity LYMPH:  no palpable lymphadenopathy in the cervical, axillary or inguinal LUNGS: clear to auscultation and percussion with normal breathing effort HEART: regular rate & rhythm and no murmurs and no lower extremity edema ABDOMEN:abdomen soft, non-tender and normal bowel sounds Musculoskeletal:no cyanosis of digits and no clubbing  NEURO: alert & oriented x 3 with fluent speech, no focal motor/sensory deficits  LABORATORY DATA:  I have reviewed the data as listed     Component Value Date/Time   NA 142 02/17/2016 1453   K 4.3 02/17/2016 1453   CL 99 02/17/2016 1453   CO2 22 02/17/2016 1453   GLUCOSE 204 (H) 02/17/2016 1453   GLUCOSE 197 (H) 10/24/2014 1000   BUN 8 02/17/2016 1453   CREATININE 0.73 02/17/2016 1453   CALCIUM 9.5 02/17/2016 1453   PROT 7.2 02/17/2016 1453   ALBUMIN 4.1 02/17/2016 1453   AST 18 02/17/2016 1453   ALT 21 02/17/2016 1453   ALKPHOS 58 02/17/2016 1453   BILITOT 0.3 02/17/2016 1453   GFRNONAA 104 02/17/2016 1453   GFRAA 120 02/17/2016 1453    No results found for: SPEP, UPEP  Lab Results  Component Value Date   WBC 9.7 02/17/2016   NEUTROABS 8.0 (H) 02/13/2015   HGB 13.7 02/13/2015   HCT 40.1 02/17/2016   MCV 85 02/17/2016   PLT 331 02/17/2016      Chemistry      Component Value Date/Time   NA 142 02/17/2016 1453   K 4.3 02/17/2016 1453   CL 99 02/17/2016 1453   CO2 22 02/17/2016 1453   BUN 8 02/17/2016 1453   CREATININE 0.73 02/17/2016 1453      Component Value Date/Time   CALCIUM 9.5 02/17/2016 1453   ALKPHOS 58 02/17/2016 1453   AST 18 02/17/2016 1453   ALT 21 02/17/2016 1453   BILITOT 0.3 02/17/2016 1453       ASSESSMENT & PLAN:  Iron deficiency anemia The last time she received intravenous iron infusion was in 2016. Her last CBC from February 2018 show no signs of anemia. However, she is symptomatic. I will check blood count and iron studies. I will call patient with test results. If she have recurrence of iron deficiency anemia, we will proceed with intravenous iron treatment. I recommend she stops taking NSAID that could contribute to menorrhagia  Menorrhagia with irregular cycle She follows closely with GI and She takes NSAID 3 times a day for joint pain. I recommend she refrain from taking NSAID and switch to Tylenol only I will check coagulation studies The patient is interested to proceed with hysterectomy.  I would defer to her GYN doctor to  discusse this with the patient  Tobacco abuse I spent some time counseling the patient the importance of tobacco cessation. We discussed common strategies including nicotine patches, Tobacco Quit-line, and other  nicotine replacement products to assist in hereffort to quit  she appears motivated to quit on her own.    Orders Placed This Encounter  Procedures  . Ferritin    Standing Status:   Standing    Number of Occurrences:   9    Standing Expiration Date:   04/15/2017  . CBC & Diff and Retic    Standing Status:   Standing    Number of Occurrences:   9    Standing Expiration Date:   04/15/2017  . Protime-INR    Standing Status:   Future    Standing Expiration Date:   05/20/2017  . APTT    Standing Status:   Future    Standing Expiration Date:   05/20/2017    All questions were answered. The patient knows to call the clinic with any problems, questions or concerns. No barriers to learning was detected.  I spent 15 minutes counseling the patient face to face. The total time spent in the appointment was 20 minutes and more than 50% was on counseling.     Heath Lark, MD 4/12/201811:31 AM

## 2016-04-15 NOTE — Telephone Encounter (Signed)
Called with below message. 

## 2016-04-15 NOTE — Telephone Encounter (Signed)
Gave patient AVS and calender per 4/12 los. Labs today and patient to f/u in 1  Yr.

## 2016-04-16 LAB — APTT: APTT: 26 s (ref 24–33)

## 2016-04-26 ENCOUNTER — Ambulatory Visit: Payer: Medicaid Other | Admitting: Obstetrics

## 2016-04-29 ENCOUNTER — Ambulatory Visit (INDEPENDENT_AMBULATORY_CARE_PROVIDER_SITE_OTHER): Payer: Medicaid Other | Admitting: Orthopedic Surgery

## 2016-05-02 ENCOUNTER — Ambulatory Visit
Admission: RE | Admit: 2016-05-02 | Discharge: 2016-05-02 | Disposition: A | Discharge status: Home / Self Care | Payer: Medicaid Other | Source: Ambulatory Visit | Attending: Orthopedic Surgery | Admitting: Orthopedic Surgery

## 2016-05-02 DIAGNOSIS — M25562 Pain in left knee: Secondary | ICD-10-CM

## 2016-05-05 ENCOUNTER — Encounter: Payer: Self-pay | Admitting: Obstetrics

## 2016-05-05 ENCOUNTER — Ambulatory Visit (INDEPENDENT_AMBULATORY_CARE_PROVIDER_SITE_OTHER): Payer: Medicaid Other | Admitting: Obstetrics

## 2016-05-05 VITALS — BP 137/95 | HR 83 | Wt 232.3 lb

## 2016-05-05 DIAGNOSIS — G8929 Other chronic pain: Secondary | ICD-10-CM

## 2016-05-05 DIAGNOSIS — Z9889 Other specified postprocedural states: Secondary | ICD-10-CM

## 2016-05-05 DIAGNOSIS — N393 Stress incontinence (female) (male): Secondary | ICD-10-CM | POA: Diagnosis not present

## 2016-05-05 DIAGNOSIS — N939 Abnormal uterine and vaginal bleeding, unspecified: Secondary | ICD-10-CM

## 2016-05-05 DIAGNOSIS — J449 Chronic obstructive pulmonary disease, unspecified: Secondary | ICD-10-CM

## 2016-05-05 DIAGNOSIS — R102 Pelvic and perineal pain: Secondary | ICD-10-CM

## 2016-05-05 DIAGNOSIS — F411 Generalized anxiety disorder: Secondary | ICD-10-CM

## 2016-05-05 NOTE — Progress Notes (Signed)
Patient is in the office for follow up. Patient states she fell down the stairs and hurt her knee, pt had a MRI done on Sunday, she has not received the results.

## 2016-05-05 NOTE — Progress Notes (Signed)
Patient ID: Andrea Montgomery, female   DOB: 1975/04/13, 41 y.o.   MRN: [id number]  Chief Complaint  Patient presents with  . Follow-up    Patient is in the office for follow up.    HPI Andrea Montgomery is a 41 y.o. female.  Continues to have pelvic pain and pressure, heavy, prolonged periods and leaking urine with cough, sneeze, etc. HPI  Past Medical History:  Diagnosis Date  . Anxiety   . Asthma   . COPD (chronic obstructive pulmonary disease) ([place])   . Diabetes mellitus   . Emphysema of lung ([place])   . Hypertension   . Iron deficiency anemia 03/12/2014    Past Surgical History:  Procedure Laterality Date  . ceserean section times 2    . DILITATION & CURRETTAGE/HYSTROSCOPY WITH HYDROTHERMAL ABLATION N/A 10/25/2014   Procedure: DILATATION & CURETTAGE/HYSTEROSCOPY WITH HYDROTHERMAL ABLATION;  Surgeon: Shelly Bombard, MD;  Location: [facility];  Service: Gynecology;  Laterality: N/A;  . TUBAL LIGATION    . tumor removed from leg      Family History  Problem Relation Age of Onset  . Hypertension Mother   . Heart disease Mother     Social History Social History  Substance Use Topics  . Smoking status: Current Every Day Smoker    Types: Cigarettes  . Smokeless tobacco: Never Used     Comment: 2 cigarettes/day  . Alcohol use No    No Known Allergies  Current Outpatient Prescriptions  Medication Sig Dispense Refill  . albuterol (PROVENTIL HFA;VENTOLIN HFA) 108 (90 BASE) MCG/ACT inhaler Inhale 2 puffs into the lungs every 6 (six) hours as needed for wheezing or shortness of breath.     . ALPRAZolam (XANAX) 1 MG tablet Take 1 mg by mouth 4 (four) times daily.    . furosemide (LASIX) 40 MG tablet Take 40 mg by mouth daily.    Marland Kitchen ibuprofen (ADVIL,MOTRIN) 800 MG tablet Take 1 tablet (800 mg total) by mouth 3 (three) times daily. 21 tablet 0  . insulin aspart protamine-insulin aspart (NOVOLOG 70/30) (70-30) 100 UNIT/ML injection Inject 15-25 Units into the skin 2 (two) times daily  with a meal. 15units in the morning and 25 units at night    . norethindrone (AYGESTIN) 5 MG tablet Take 2 tablets (10 mg total) by mouth daily. 60 tablet 5  . olmesartan (BENICAR) 5 MG tablet Take by mouth daily.    . cyclobenzaprine (FLEXERIL) 10 MG tablet Take 1 tablet (10 mg total) by mouth 2 (two) times daily as needed for muscle spasms. (Patient not taking: Reported on 04/15/2016) 20 tablet 0  . Fluticasone-Salmeterol (ADVAIR DISKUS) 250-50 MCG/DOSE AEPB Inhale 1 puff into the lungs every 12 (twelve) hours. For shortness of breath    . medroxyPROGESTERone (PROVERA) 10 MG tablet Take 2 tablets (20 mg total) by mouth daily. (Patient not taking: Reported on 04/15/2016) 60 tablet 0   No current facility-administered medications for this visit.     Review of Systems Review of Systems Constitutional: negative for fatigue and weight loss Respiratory:positive for wheezing Cardiovascular: negative for chest pain, fatigue and palpitations Gastrointestinal: negative for abdominal pain and change in bowel habits Genitourinary:positive for Chronic Pelvic Pain, AUB and SUI Integument/breast: negative for nipple discharge Musculoskeletal:negative for myalgias Neurological: negative for gait problems and tremors Behavioral/Psych: negative for abusive relationship, depression.  Positive for anxiety Endocrine: negative for temperature intolerance      Blood pressure (!) 137/95, pulse 83, weight 232 lb 4.8 oz (105.4  kg).  Physical Exam Physical Exam:  Deferred  >50% of 10 min visit spent on counseling and coordination of care.    Data Reviewed Labs  Ultrasound Pathology  Assessment     Chronic Pelvic Pain AUB SUI    Plan    Referred ro Urogynecology High Dose Depo Provera Injection ordered IM F/U prn   Orders Placed This Encounter  Procedures  . Ambulatory referral to Urogynecology    Referral Priority:   Routine    Referral Type:   Consultation    Referral Reason:   Specialty  Services Required    Requested Specialty:   Urology    Number of Visits Requested:   1   No orders of the defined types were placed in this encounter.

## 2016-05-06 ENCOUNTER — Encounter (INDEPENDENT_AMBULATORY_CARE_PROVIDER_SITE_OTHER): Payer: Self-pay | Admitting: Orthopedic Surgery

## 2016-05-06 ENCOUNTER — Ambulatory Visit (INDEPENDENT_AMBULATORY_CARE_PROVIDER_SITE_OTHER): Payer: Medicaid Other | Admitting: Orthopedic Surgery

## 2016-05-06 ENCOUNTER — Telehealth (INDEPENDENT_AMBULATORY_CARE_PROVIDER_SITE_OTHER): Payer: Self-pay | Admitting: Orthopaedic Surgery

## 2016-05-06 DIAGNOSIS — M25569 Pain in unspecified knee: Secondary | ICD-10-CM | POA: Diagnosis not present

## 2016-05-06 DIAGNOSIS — G8929 Other chronic pain: Secondary | ICD-10-CM

## 2016-05-06 NOTE — Telephone Encounter (Signed)
Error

## 2016-05-06 NOTE — Progress Notes (Signed)
Office Visit Note   Patient: Andrea Montgomery           Date of Birth: [birth year]           MRN: [id number] Visit Date: 05/06/2016 Requested by: Vincente Liberty, MD [address] [place], [place] [address] PCP: Leola Brazil, MD  Subjective: Chief Complaint  Patient presents with  . Left Knee - Follow-up, Pain    HPI: She cannot is a patient here for review of left knee MRI scan.  She's been a relating with a cane still has some pain in the left knee.  MRI scan is reviewed with the patient and shows partial tear of the proximal MCL at its femoral attachment.  No other bony abnormalities.  Date of injury 03/21/2016.  She denies any other orthopedic complaints.              ROS: All systems reviewed are negative as they relate to the chief complaint within the history of present illness.  Patient denies  fevers or chills.   Assessment & Plan: Visit Diagnoses:  1. Chronic knee pain, unspecified laterality     Plan: Impression is stable need to valgus stress at 0 and 30 with partial tearing of the proximal aspect of the MCL.  This is not an operative problem.  She does not want to do physical therapy.  I want her to do range of motion exercises quad strengthening.  I'll give her a brace that she can use for prolonged ambulation.  This should be a self-limited injury which does not require surgical intervention.  I'll see her back as needed  Follow-Up Instructions: Return if symptoms worsen or fail to improve.   Orders:  No orders of the defined types were placed in this encounter.  No orders of the defined types were placed in this encounter.     Procedures: No procedures performed   Clinical Data: No additional findings.  Objective: Vital Signs: There were no vitals taken for this visit.  Physical Exam:   Constitutional: Patient appears well-developed HEENT:  Head: Normocephalic Eyes:EOM are normal Neck: Normal range of motion Cardiovascular: Normal  rate Pulmonary/chest: Effort normal Neurologic: Patient is alert Skin: Skin is warm Psychiatric: Patient has normal mood and affect    Ortho Exam: Orthopedic exam demonstrates antalgic gait to the left palpable pedal pulses she is able to fully extend the knee but it is with some pain.  She has tenderness over the medial epicondyles collaterals are stable she has stability to varus and valgus stress at 0 and 30.  Tenderness to palpation is present at the medial epicondyles flexion is to about 115.  Specialty Comments:  No specialty comments available.  Imaging: No results found.   PMFS History: Patient Active Problem List   Diagnosis Date Noted  . Tobacco abuse 07/19/2014  . Knee pain, chronic 03/13/2014  . Abnormal MRI, knee 03/13/2014  . Menorrhagia with irregular cycle 03/13/2014  . Iron deficiency anemia 03/12/2014   Past Medical History:  Diagnosis Date  . Anxiety   . Asthma   . COPD (chronic obstructive pulmonary disease) ([place])   . Diabetes mellitus   . Emphysema of lung ([place])   . Hypertension   . Iron deficiency anemia 03/12/2014    Family History  Problem Relation Age of Onset  . Hypertension Mother   . Heart disease Mother     Past Surgical History:  Procedure Laterality Date  . ceserean section times 2    .  DILITATION & CURRETTAGE/HYSTROSCOPY WITH HYDROTHERMAL ABLATION N/A 10/25/2014   Procedure: DILATATION & CURETTAGE/HYSTEROSCOPY WITH HYDROTHERMAL ABLATION;  Surgeon: Shelly Bombard, MD;  Location: [facility];  Service: Gynecology;  Laterality: N/A;  . TUBAL LIGATION    . 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 Not on file.   Social History Main Topics  . Smoking status: Current Every Day Smoker    Types: Cigarettes  . Smokeless tobacco: Never Used     Comment: 2 cigarettes/day  . Alcohol use No  . Drug use: No  . Sexual activity: Yes    Partners: Male    Birth control/ protection: None, Surgical

## 2016-07-13 ENCOUNTER — Other Ambulatory Visit: Payer: Self-pay | Admitting: Physician Assistant

## 2016-07-13 ENCOUNTER — Ambulatory Visit
Admission: RE | Admit: 2016-07-13 | Discharge: 2016-07-13 | Disposition: A | Discharge status: Home / Self Care | Payer: Medicaid Other | Source: Ambulatory Visit | Attending: Physician Assistant | Admitting: Physician Assistant

## 2016-07-13 DIAGNOSIS — M5441 Lumbago with sciatica, right side: Secondary | ICD-10-CM

## 2016-07-13 DIAGNOSIS — M5442 Lumbago with sciatica, left side: Principal | ICD-10-CM

## 2016-09-09 ENCOUNTER — Ambulatory Visit (INDEPENDENT_AMBULATORY_CARE_PROVIDER_SITE_OTHER): Payer: Medicaid Other | Admitting: Orthopedic Surgery

## 2016-09-09 ENCOUNTER — Encounter (INDEPENDENT_AMBULATORY_CARE_PROVIDER_SITE_OTHER): Payer: Self-pay | Admitting: Orthopedic Surgery

## 2016-09-09 DIAGNOSIS — G8929 Other chronic pain: Secondary | ICD-10-CM | POA: Diagnosis not present

## 2016-09-09 DIAGNOSIS — M25562 Pain in left knee: Secondary | ICD-10-CM

## 2016-09-09 NOTE — Progress Notes (Signed)
Office Visit Note   Patient: Andrea Montgomery           Date of Birth: 05-May-1975           MRN: [id number] Visit Date: 09/09/2016 Requested by: Vincente Liberty, MD [address] [place], [place] [address] PCP: Vincente Liberty, MD  Subjective: Chief Complaint  Patient presents with  . Left Knee - Pain    HPI: Patient is a female with left knee pain.  States her knee is not well.  She reports the left knee is giving way.  She has a brace but it doesn't stay up.  She states she has fallen 3 times in the last month.  She had an MRI scan April 2018 which showed partial proximal MCL tearing.  Patient works as an occupation on disability for "a [employer]" and that includes diabetes asthma COPD anxiety and depression.  She has Medicaid and numbness by her admission to really go to physical therapy.              ROS: All systems reviewed are negative as they relate to the chief complaint within the history of present illness.  Patient denies  fevers or chills.   Assessment & Plan: Visit Diagnoses: No diagnosis found.  Plan: Impression is left knee pain with not much in way of arthritis but she does have a history of partial MCL injury.  I will try to get her a new brace to see if that will help.  I think leg strengthening is the main [place] year if she can manage it.  Otherwise I think she may have a mildly dysfunctional knee which is deconditioned.  Structurally however the knee is intact on both examination as well as MRI scan.  Follow-Up Instructions: Return if symptoms worsen or fail to improve.   Orders:  No orders of the defined types were placed in this encounter.  No orders of the defined types were placed in this encounter.     Procedures: No procedures performed   Clinical Data: No additional findings.  Objective: Vital Signs: There were no vitals taken for this visit.  Physical Exam:   Constitutional: Patient appears well-developed HEENT:  Head:  Normocephalic Eyes:EOM are normal Neck: Normal range of motion Cardiovascular: Normal rate Pulmonary/chest: Effort normal Neurologic: Patient is alert Skin: Skin is warm Psychiatric: Patient has normal mood and affect    Ortho Exam: Orthopedic exam demonstrates full active and passive range of motion of the left knee.  She does have some medial sided tenderness but no effusion.  The patient's knee is stable to varus and valgus stress at 0 and 30 with symmetric opening.  No real instability is noted.  Pedal pulses palpable.  No groin pain with internal/external rotation of the leg.  Specialty Comments:  No specialty comments available.  Imaging: No results found.   PMFS History: Patient Active Problem List   Diagnosis Date Noted  . Tobacco abuse 07/19/2014  . Knee pain, chronic 03/13/2014  . Abnormal MRI, knee 03/13/2014  . Menorrhagia with irregular cycle 03/13/2014  . Iron deficiency anemia 03/12/2014   Past Medical History:  Diagnosis Date  . Anxiety   . Asthma   . COPD (chronic obstructive pulmonary disease) ([place])   . Diabetes mellitus   . Emphysema of lung ([place])   . Hypertension   . Iron deficiency anemia 03/12/2014    Family History  Problem Relation Age of Onset  . Hypertension Mother   .  Heart disease Mother     Past Surgical History:  Procedure Laterality Date  . ceserean section times 2    . DILITATION & CURRETTAGE/HYSTROSCOPY WITH HYDROTHERMAL ABLATION N/A 10/25/2014   Procedure: DILATATION & CURETTAGE/HYSTEROSCOPY WITH HYDROTHERMAL ABLATION;  Surgeon: Shelly Bombard, MD;  Location: [facility];  Service: Gynecology;  Laterality: N/A;  . TUBAL LIGATION    . 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 Not on file.   Social History Main Topics  . Smoking status: Current Every Day Smoker    Types: Cigarettes  . Smokeless tobacco: Never Used     Comment: 2 cigarettes/day  . Alcohol use No  . Drug use: No  . Sexual activity: Yes     Partners: Male    Birth control/ protection: None, Surgical

## 2017-01-10 ENCOUNTER — Ambulatory Visit (INDEPENDENT_AMBULATORY_CARE_PROVIDER_SITE_OTHER): Payer: Medicaid Other | Admitting: Orthopedic Surgery

## 2017-01-21 ENCOUNTER — Ambulatory Visit (INDEPENDENT_AMBULATORY_CARE_PROVIDER_SITE_OTHER): Payer: Medicaid Other | Admitting: Orthopedic Surgery

## 2017-02-02 ENCOUNTER — Encounter (INDEPENDENT_AMBULATORY_CARE_PROVIDER_SITE_OTHER): Payer: Self-pay | Admitting: Orthopedic Surgery

## 2017-02-02 ENCOUNTER — Ambulatory Visit (INDEPENDENT_AMBULATORY_CARE_PROVIDER_SITE_OTHER): Payer: Medicaid Other | Admitting: Orthopedic Surgery

## 2017-02-02 DIAGNOSIS — M25562 Pain in left knee: Secondary | ICD-10-CM | POA: Diagnosis not present

## 2017-02-05 ENCOUNTER — Encounter (INDEPENDENT_AMBULATORY_CARE_PROVIDER_SITE_OTHER): Payer: Self-pay | Admitting: Orthopedic Surgery

## 2017-02-05 NOTE — Progress Notes (Signed)
Office Visit Note   Patient: Andrea Montgomery           Date of Birth: Oct 25, 1975           MRN: [id number] Visit Date: 02/02/2017 Requested by: Vincente Liberty, MD [address] [place], [place] [address] PCP: Vincente Liberty, MD  Subjective: Chief Complaint  Patient presents with  . Left Knee - Pain, Follow-up    HPI: Patient presents for evaluation of left knee pain.  She reports continued pain in the left knee.  She was fitted for a brace but the does not wear it because it is too heavy.  She is fallen 3 times since Christmas.  MRI scan showed proximal MCL tear 418.  Uses a walker at home.  Previous exams demonstrates stable MCL.             ROS: All systems reviewed are negative as they relate to the chief complaint within the history of present illness.  Patient denies  fevers or chills.   Assessment & Plan: Visit Diagnoses:  1. Left knee pain, unspecified chronicity     Plan: Impression is continued left knee pain with stable MCL on exam.  No surgical indication at this time.  Once again I encouraged her to do quad strengthening exercises.  We can have her be seen again to get the brace adjusted but currently no operative indication.  I will see her back as needed.  Really is up to her to rehab this left knee.  Follow-Up Instructions: Return if symptoms worsen or fail to improve.   Orders:  No orders of the defined types were placed in this encounter.  No orders of the defined types were placed in this encounter.     Procedures: No procedures performed   Clinical Data: No additional findings.  Objective: Vital Signs: There were no vitals taken for this visit.  Physical Exam:   Constitutional: Patient appears well-developed HEENT:  Head: Normocephalic Eyes:EOM are normal Neck: Normal range of motion Cardiovascular: Normal rate Pulmonary/chest: Effort normal Neurologic: Patient is alert Skin: Skin is warm Psychiatric: Patient has normal mood  and affect    Ortho Exam: Orthopedic exam demonstrates that she is very touchy about having the knee examined.  She is stable to valgus stress at 0 and 30 degrees.  ACL PCL stable.  There is no effusion in the knee.  Range of motion is reasonable.  Pedal pulses palpable.  No posterior lateral rotatory instability is noted.  Specialty Comments:  No specialty comments available.  Imaging: No results found.   PMFS History: Patient Active Problem List   Diagnosis Date Noted  . Tobacco abuse 07/19/2014  . Knee pain, chronic 03/13/2014  . Abnormal MRI, knee 03/13/2014  . Menorrhagia with irregular cycle 03/13/2014  . Iron deficiency anemia 03/12/2014   Past Medical History:  Diagnosis Date  . Anxiety   . Asthma   . COPD (chronic obstructive pulmonary disease) ([place])   . Diabetes mellitus   . Emphysema of lung ([place])   . Hypertension   . Iron deficiency anemia 03/12/2014    Family History  Problem Relation Age of Onset  . Hypertension Mother   . Heart disease Mother     Past Surgical History:  Procedure Laterality Date  . ceserean section times 2    . DILITATION & CURRETTAGE/HYSTROSCOPY WITH HYDROTHERMAL ABLATION N/A 10/25/2014   Procedure: DILATATION & CURETTAGE/HYSTEROSCOPY WITH HYDROTHERMAL ABLATION;  Surgeon: Shelly Bombard, MD;  Location: [facility];  Service: Gynecology;  Laterality: N/A;  . TUBAL LIGATION    . 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 Not on file  Tobacco Use  . Smoking status: Current Every Day Smoker    Types: Cigarettes  . Smokeless tobacco: Never Used  . Tobacco comment: 2 cigarettes/day  Substance and Sexual Activity  . Alcohol use: No    Alcohol/week: 0.0 oz  . Drug use: No  . Sexual activity: Yes    Partners: Male    Birth control/protection: None, Surgical

## 2017-03-18 ENCOUNTER — Telehealth (INDEPENDENT_AMBULATORY_CARE_PROVIDER_SITE_OTHER): Payer: Self-pay | Admitting: Orthopedic Surgery

## 2017-03-18 NOTE — Telephone Encounter (Signed)
Have emailed [employer] rep to see what we can do about this for patient. She has already had adjusted once .

## 2017-03-18 NOTE — Telephone Encounter (Signed)
Patient called stating the brace is not fitting properly. She cant find number to company that made brace.  Please call patient

## 2017-03-21 NOTE — Telephone Encounter (Signed)
Per email I received from [place] with [facility], he is meeting patient on Tuesday a.m.

## 2017-04-01 ENCOUNTER — Other Ambulatory Visit: Payer: Self-pay | Admitting: Hematology and Oncology

## 2017-04-01 ENCOUNTER — Telehealth: Payer: Self-pay

## 2017-04-01 DIAGNOSIS — D509 Iron deficiency anemia, unspecified: Secondary | ICD-10-CM

## 2017-04-01 NOTE — Telephone Encounter (Signed)
Called per Dr. Alvy Bimler. Ask if she could come in on 4/5 for lab work. If her labs are okay, we might cancel appt on 4/12. She is agreeable. Scheduling message sent.

## 2017-04-08 ENCOUNTER — Telehealth: Payer: Self-pay | Admitting: Hematology and Oncology

## 2017-04-08 ENCOUNTER — Telehealth: Payer: Self-pay | Admitting: Medical Oncology

## 2017-04-08 ENCOUNTER — Other Ambulatory Visit: Payer: Self-pay

## 2017-04-08 NOTE — Telephone Encounter (Signed)
Mailed patient calendar of upcoming may appointments per 4/5 sch message

## 2017-04-08 NOTE — Telephone Encounter (Signed)
I returned pt call . Now her Temp 101.6 , throat scratchy. She was  exposed to flu this week ( daughter tested postive on Monday). I told pt to go to urgent care to see if she is eligible for tamiflu. Please r/s appts.Marland Kitchen

## 2017-04-08 NOTE — Telephone Encounter (Signed)
Per On call note today at 0730, pt has appt today and " she now has  fever of 100.5 f, runny nose , scratchy throat.Denied triage".

## 2017-04-15 ENCOUNTER — Ambulatory Visit: Payer: Medicaid Other | Admitting: Hematology and Oncology

## 2017-04-15 ENCOUNTER — Other Ambulatory Visit: Payer: Medicaid Other

## 2017-04-29 ENCOUNTER — Other Ambulatory Visit (HOSPITAL_COMMUNITY)
Admission: RE | Admit: 2017-04-29 | Discharge: 2017-04-29 | Disposition: A | Discharge status: Home / Self Care | Payer: Medicaid Other | Source: Ambulatory Visit | Attending: Obstetrics | Admitting: Obstetrics

## 2017-04-29 ENCOUNTER — Ambulatory Visit (INDEPENDENT_AMBULATORY_CARE_PROVIDER_SITE_OTHER): Payer: Medicaid Other | Admitting: Obstetrics

## 2017-04-29 ENCOUNTER — Encounter: Payer: Self-pay | Admitting: Obstetrics

## 2017-04-29 VITALS — BP 146/81 | HR 92 | Wt 227.0 lb

## 2017-04-29 DIAGNOSIS — Z124 Encounter for screening for malignant neoplasm of cervix: Secondary | ICD-10-CM | POA: Diagnosis present

## 2017-04-29 DIAGNOSIS — N946 Dysmenorrhea, unspecified: Secondary | ICD-10-CM

## 2017-04-29 DIAGNOSIS — J449 Chronic obstructive pulmonary disease, unspecified: Secondary | ICD-10-CM

## 2017-04-29 DIAGNOSIS — F411 Generalized anxiety disorder: Secondary | ICD-10-CM

## 2017-04-29 DIAGNOSIS — Z0000 Encounter for general adult medical examination without abnormal findings: Secondary | ICD-10-CM | POA: Diagnosis not present

## 2017-04-29 DIAGNOSIS — Z6841 Body Mass Index (BMI) 40.0 and over, adult: Secondary | ICD-10-CM

## 2017-04-29 DIAGNOSIS — Z01419 Encounter for gynecological examination (general) (routine) without abnormal findings: Secondary | ICD-10-CM

## 2017-04-29 DIAGNOSIS — Z9889 Other specified postprocedural states: Secondary | ICD-10-CM

## 2017-04-29 DIAGNOSIS — N939 Abnormal uterine and vaginal bleeding, unspecified: Secondary | ICD-10-CM

## 2017-04-29 MED ORDER — IBUPROFEN 800 MG PO TABS: 800.0000 mg | ORAL_TABLET | Freq: Three times a day (TID) | ORAL | 5 refills | Status: DC

## 2017-04-29 MED ORDER — COMFORT FIT MATERNITY SUPP SM MISC: 0 refills | Status: DC

## 2017-04-29 MED ORDER — IBUPROFEN 800 MG PO TABS: 800.0000 mg | ORAL_TABLET | Freq: Three times a day (TID) | ORAL | 0 refills | Status: DC

## 2017-04-29 MED ORDER — OXYCODONE-ACETAMINOPHEN 10-325 MG PO TABS: 1.0000 | ORAL_TABLET | Freq: Four times a day (QID) | ORAL | 0 refills | Status: DC | PRN

## 2017-04-29 NOTE — Progress Notes (Signed)
RGYN patient presents for problem visit today.  CC: abnormal bleeding since Sunday associated w/ cramps. Pt has had an Ablation in 2016 Per pt unable to void.

## 2017-04-29 NOTE — Progress Notes (Signed)
Subjective:        Andrea Montgomery is a 42 y.o. female here for a routine exam.  Current complaints: Period on with severe cramping.  History of Endometrial Ablation ~ 3 years ago, and had been amenorrheic since then..    Personal health questionnaire:  Is patient Andrea Montgomery, have a family history of breast and/or ovarian cancer: no Is there a family history of uterine cancer diagnosed at age < 6, gastrointestinal cancer, urinary tract cancer, family member who is a Field seismologist syndrome-associated carrier: no Is the patient overweight and hypertensive, family history of diabetes, personal history of gestational diabetes, preeclampsia or PCOS: no Is patient over 36, have PCOS,  family history of premature CHD under age 94, diabetes, smoke, have hypertension or peripheral artery disease:  no At any time, has a partner hit, kicked or otherwise hurt or frightened you?: no Over the past 2 weeks, have you felt down, depressed or hopeless?: no Over the past 2 weeks, have you felt little interest or pleasure in doing things?:no   Gynecologic History No LMP recorded. Patient has had an ablation. Contraception: tubal ligation Last Pap: 2018. Results were: normal Last mammogram: none. Results were: none  Obstetric History OB History  No data available    Past Medical History:  Diagnosis Date  . Anxiety   . Asthma   . COPD (chronic obstructive pulmonary disease) ([place])   . Diabetes mellitus   . Emphysema of lung ([place])   . Hypertension   . Iron deficiency anemia 03/12/2014    Past Surgical History:  Procedure Laterality Date  . ceserean section times 2    . DILITATION & CURRETTAGE/HYSTROSCOPY WITH HYDROTHERMAL ABLATION N/A 10/25/2014   Procedure: DILATATION & CURETTAGE/HYSTEROSCOPY WITH HYDROTHERMAL ABLATION;  Surgeon: Shelly Bombard, MD;  Location: [facility];  Service: Gynecology;  Laterality: N/A;  . TUBAL LIGATION    . tumor removed from leg       Current Outpatient Medications:   .  albuterol (PROVENTIL HFA;VENTOLIN HFA) 108 (90 BASE) MCG/ACT inhaler, Inhale 2 puffs into the lungs every 6 (six) hours as needed for wheezing or shortness of breath. , Disp: , Rfl:  .  ALPRAZolam (XANAX) 1 MG tablet, Take 1 mg by mouth 4 (four) times daily., Disp: , Rfl:  .  Fluticasone-Salmeterol (ADVAIR DISKUS) 250-50 MCG/DOSE AEPB, Inhale 1 puff into the lungs every 12 (twelve) hours. For shortness of breath, Disp: , Rfl:  .  furosemide (LASIX) 40 MG tablet, Take 40 mg by mouth daily., Disp: , Rfl:  .  ibuprofen (ADVIL,MOTRIN) 800 MG tablet, Take 1 tablet (800 mg total) by mouth 3 (three) times daily., Disp: 30 tablet, Rfl: 5 .  insulin aspart protamine-insulin aspart (NOVOLOG 70/30) (70-30) 100 UNIT/ML injection, Inject 15-25 Units into the skin 2 (two) times daily with a meal. 15units in the morning and 25 units at night, Disp: , Rfl:  .  olmesartan (BENICAR) 5 MG tablet, Take by mouth daily., Disp: , Rfl:  .  cyclobenzaprine (FLEXERIL) 10 MG tablet, Take 1 tablet (10 mg total) by mouth 2 (two) times daily as needed for muscle spasms. (Patient not taking: Reported on 04/15/2016), Disp: 20 tablet, Rfl: 0 .  medroxyPROGESTERone (PROVERA) 10 MG tablet, Take 2 tablets (20 mg total) by mouth daily. (Patient not taking: Reported on 04/15/2016), Disp: 60 tablet, Rfl: 0 .  norethindrone (AYGESTIN) 5 MG tablet, Take 2 tablets (10 mg total) by mouth daily., Disp: 60 tablet, Rfl: 5 .  oxyCODONE-acetaminophen (  PERCOCET) 10-325 MG tablet, Take 1 tablet by mouth every 6 (six) hours as needed for pain., Disp: 20 tablet, Rfl: 0 No Known Allergies  Social History   Tobacco Use  . Smoking status: Current Every Day Smoker    Types: Cigarettes  . Smokeless tobacco: Never Used  . Tobacco comment: 2 cigarettes/day  Substance Use Topics  . Alcohol use: No    Alcohol/week: 0.0 oz    Family History  Problem Relation Age of Onset  . Hypertension Mother   . Heart disease Mother       Review of  Systems  Constitutional: negative for fatigue and weight loss Respiratory: negative for cough and wheezing Cardiovascular: negative for chest pain, fatigue and palpitations Gastrointestinal: negative for abdominal pain and change in bowel habits Musculoskeletal:negative for myalgias Neurological: negative for gait problems and tremors Behavioral/Psych: negative for abusive relationship, depression Endocrine: negative for temperature intolerance    Genitourinary:negative for abnormal menstrual periods, genital lesions, hot flashes, sexual problems and vaginal discharge Integument/breast: negative for breast lump, breast tenderness, nipple discharge and skin lesion(s)    Objective:       BP (!) 146/81   Pulse 92   Wt 227 lb (103 kg)   BMI 42.89 kg/m  General:   alert  Skin:   no rash or abnormalities  Lungs:   clear to auscultation bilaterally  Heart:   regular rate and rhythm, S1, S2 normal, no murmur, click, rub or gallop  Breasts:   normal without suspicious masses, skin or nipple changes or axillary nodes  Abdomen:  normal findings: no organomegaly, soft, non-tender and no hernia  Pelvis:  External genitalia: normal general appearance Urinary system: urethral meatus normal and bladder without fullness, nontender Vaginal: normal without tenderness, induration or masses Cervix: normal appearance Adnexa: normal bimanual exam Uterus: anteverted and non-tender, normal size   Lab Review Urine pregnancy test Labs reviewed yes Radiologic studies reviewed yes  50% of 20 min visit spent on counseling and coordination of care.   Assessment:     1. Encounter for gynecological examination  2. Screening for cervical cancer Rx: - Cytology - PAP  3. Abnormal uterine bleeding (AUB) Rx: - Cervicovaginal ancillary only  4. Dysmenorrhea Rx: - oxyCODONE-acetaminophen (PERCOCET) 10-325 MG tablet; Take 1 tablet by mouth every 6 (six) hours as needed for pain.  Dispense: 20 tablet;  Refill: 0 - ibuprofen (ADVIL,MOTRIN) 800 MG tablet; Take 1 tablet (800 mg total) by mouth 3 (three) times daily.  Dispense: 30 tablet; Refill: 5  5. S/P endometrial ablation  6. Chronic obstructive pulmonary disease, unspecified COPD type ([place])  7. Generalized anxiety disorder  8. Class 3 severe obesity due to excess calories with serious comorbidity and body mass index (BMI) of 40.0 to 44.9 in adult [facility])    Plan:    Education reviewed: calcium supplements, depression evaluation, low fat, low cholesterol diet, safe sex/STD prevention, self breast exams and weight bearing exercise. Follow up in: 6 weeks.   Meds ordered this encounter  Medications  . DISCONTD: ibuprofen (ADVIL,MOTRIN) 800 MG tablet    Sig: Take 1 tablet (800 mg total) by mouth 3 (three) times daily.    Dispense:  21 tablet    Refill:  0  . oxyCODONE-acetaminophen (PERCOCET) 10-325 MG tablet    Sig: Take 1 tablet by mouth every 6 (six) hours as needed for pain.    Dispense:  20 tablet    Refill:  0  . ibuprofen (ADVIL,MOTRIN) 800 MG tablet  Sig: Take 1 tablet (800 mg total) by mouth 3 (three) times daily.    Dispense:  30 tablet    Refill:  5  . DISCONTD: Elastic Bandages & Supports (COMFORT FIT MATERNITY SUPP SM) MISC    Sig: Wear as directed.    Dispense:  1 each    Refill:  0   No orders of the defined types were placed in this encounter.   Shelly Bombard MD 04-29-2017

## 2017-05-02 LAB — CERVICOVAGINAL ANCILLARY ONLY
Bacterial vaginitis: NEGATIVE
CANDIDA VAGINITIS: NEGATIVE
Chlamydia: NEGATIVE
Neisseria Gonorrhea: NEGATIVE
TRICH (WINDOWPATH): POSITIVE — AB

## 2017-05-03 ENCOUNTER — Telehealth: Payer: Self-pay

## 2017-05-03 ENCOUNTER — Other Ambulatory Visit: Payer: Self-pay

## 2017-05-03 ENCOUNTER — Other Ambulatory Visit: Payer: Self-pay | Admitting: Obstetrics

## 2017-05-03 DIAGNOSIS — A5901 Trichomonal vulvovaginitis: Secondary | ICD-10-CM

## 2017-05-03 MED ORDER — TINIDAZOLE 500 MG PO TABS: 2.0000 g | ORAL_TABLET | Freq: Once | ORAL | 0 refills | Status: AC

## 2017-05-03 MED ORDER — TINIDAZOLE 500 MG PO TABS: 2.0000 g | ORAL_TABLET | Freq: Once | ORAL | 1 refills | Status: AC

## 2017-05-03 NOTE — Progress Notes (Signed)
Pt requested Rs be sent to a different office.

## 2017-05-03 NOTE — Telephone Encounter (Signed)
Pre Auth  Completed and Rx Tinidazole approved  PA# [id number]

## 2017-05-04 LAB — CYTOLOGY - PAP
DIAGNOSIS: NEGATIVE
HPV: NOT DETECTED

## 2017-05-05 ENCOUNTER — Other Ambulatory Visit: Payer: Self-pay | Admitting: Obstetrics

## 2017-05-05 DIAGNOSIS — A5901 Trichomonal vulvovaginitis: Secondary | ICD-10-CM

## 2017-05-05 MED ORDER — TINIDAZOLE 500 MG PO TABS: 2.0000 g | ORAL_TABLET | Freq: Once | ORAL | 0 refills | Status: AC

## 2017-05-06 ENCOUNTER — Inpatient Hospital Stay: Payer: Medicaid Other

## 2017-05-09 ENCOUNTER — Inpatient Hospital Stay: Payer: Medicaid Other | Attending: Hematology and Oncology

## 2017-05-09 ENCOUNTER — Telehealth: Payer: Self-pay | Admitting: *Deleted

## 2017-05-09 DIAGNOSIS — D509 Iron deficiency anemia, unspecified: Secondary | ICD-10-CM | POA: Diagnosis present

## 2017-05-09 LAB — CBC WITH DIFFERENTIAL/PLATELET
BASOS ABS: 0 10*3/uL (ref 0.0–0.1)
Basophils Relative: 0 %
Eosinophils Absolute: 0.2 10*3/uL (ref 0.0–0.5)
Eosinophils Relative: 2 %
HCT: 41.9 % (ref 34.8–46.6)
Hemoglobin: 12.9 g/dL (ref 11.6–15.9)
Lymphocytes Relative: 27 %
Lymphs Abs: 2.3 10*3/uL (ref 0.9–3.3)
MCH: 28.4 pg (ref 25.1–34.0)
MCHC: 30.8 g/dL — AB (ref 31.5–36.0)
MCV: 92.3 fL (ref 79.5–101.0)
MONO ABS: 0.7 10*3/uL (ref 0.1–0.9)
Monocytes Relative: 8 %
Neutro Abs: 5.5 10*3/uL (ref 1.5–6.5)
Neutrophils Relative %: 63 %
Platelets: 282 10*3/uL (ref 145–400)
RBC: 4.54 MIL/uL (ref 3.70–5.45)
RDW: 12.9 % (ref 11.2–14.5)
WBC: 8.6 10*3/uL (ref 3.9–10.3)

## 2017-05-09 LAB — IRON AND TIBC
IRON: 46 ug/dL (ref 41–142)
Saturation Ratios: 15 % — ABNORMAL LOW (ref 21–57)
TIBC: 309 ug/dL (ref 236–444)
UIBC: 262 ug/dL

## 2017-05-09 LAB — FERRITIN: FERRITIN: 88 ng/mL (ref 9–269)

## 2017-05-09 NOTE — Telephone Encounter (Signed)
-----   Message from Heath Lark, MD sent at 05/09/2017 11:04 AM EDT ----- Regarding: labs ok She is not anemic OK to cancel next week appt if OK with patient No need follow-up ----- Message ----- From: Interface, Lab In [place] Sent: 05/09/2017   8:48 AM To: Heath Lark, MD

## 2017-05-09 NOTE — Telephone Encounter (Signed)
Notified of message below. Appt next week cancelled

## 2017-05-13 ENCOUNTER — Ambulatory Visit: Payer: Medicaid Other | Admitting: Hematology and Oncology

## 2017-05-23 ENCOUNTER — Other Ambulatory Visit (HOSPITAL_COMMUNITY)
Admission: RE | Admit: 2017-05-23 | Discharge: 2017-05-23 | Disposition: A | Discharge status: Home / Self Care | Payer: Medicaid Other | Source: Ambulatory Visit | Attending: Obstetrics | Admitting: Obstetrics

## 2017-05-23 ENCOUNTER — Ambulatory Visit: Payer: Medicaid Other | Admitting: Obstetrics

## 2017-05-23 ENCOUNTER — Encounter: Payer: Self-pay | Admitting: Obstetrics

## 2017-05-23 VITALS — BP 136/90 | HR 100 | Wt 221.0 lb

## 2017-05-23 DIAGNOSIS — N939 Abnormal uterine and vaginal bleeding, unspecified: Secondary | ICD-10-CM

## 2017-05-23 DIAGNOSIS — N898 Other specified noninflammatory disorders of vagina: Secondary | ICD-10-CM | POA: Insufficient documentation

## 2017-05-23 MED ORDER — NORETHINDRONE ACETATE 5 MG PO TABS: 10.0000 mg | ORAL_TABLET | Freq: Every day | ORAL | 5 refills | Status: DC

## 2017-05-23 NOTE — Progress Notes (Signed)
Pt c/o menorrhagia and dysmenorrhea with half dollar size clots since April 26.

## 2017-05-23 NOTE — Progress Notes (Signed)
Patient ID: Andrea Montgomery, female   DOB: [birth year], 42 y.o.   MRN: [id number]  Chief Complaint  Patient presents with  . Vaginal Bleeding    HPI Andrea Montgomery is a 42 y.o. female.  Vaginal bleeding with cramping and clots for past few weeks.  S/P Endometrial Ablation 3 years ago. HPI  Past Medical History:  Diagnosis Date  . Anxiety   . Asthma   . COPD (chronic obstructive pulmonary disease) ([place])   . Diabetes mellitus   . Emphysema of lung ([place])   . Hypertension   . Iron deficiency anemia 03/12/2014    Past Surgical History:  Procedure Laterality Date  . ceserean section times 2    . DILITATION & CURRETTAGE/HYSTROSCOPY WITH HYDROTHERMAL ABLATION N/A 10/25/2014   Procedure: DILATATION & CURETTAGE/HYSTEROSCOPY WITH HYDROTHERMAL ABLATION;  Surgeon: Shelly Bombard, MD;  Location: [facility];  Service: Gynecology;  Laterality: N/A;  . TUBAL LIGATION    . tumor removed from leg      Family History  Problem Relation Age of Onset  . Hypertension Mother   . Heart disease Mother     Social History Social History   Tobacco Use  . Smoking status: Current Every Day Smoker    Types: Cigarettes  . Smokeless tobacco: Never Used  . Tobacco comment: 2 cigarettes/day  Substance Use Topics  . Alcohol use: No    Alcohol/week: 0.0 oz  . Drug use: No    No Known Allergies  Current Outpatient Medications  Medication Sig Dispense Refill  . albuterol (PROVENTIL HFA;VENTOLIN HFA) 108 (90 BASE) MCG/ACT inhaler Inhale 2 puffs into the lungs every 6 (six) hours as needed for wheezing or shortness of breath.     . ALPRAZolam (XANAX) 1 MG tablet Take 1 mg by mouth 4 (four) times daily.    . cyclobenzaprine (FLEXERIL) 10 MG tablet Take 1 tablet (10 mg total) by mouth 2 (two) times daily as needed for muscle spasms. 20 tablet 0  . Fluticasone-Salmeterol (ADVAIR DISKUS) 250-50 MCG/DOSE AEPB Inhale 1 puff into the lungs every 12 (twelve) hours. For shortness of breath    . furosemide (LASIX)  40 MG tablet Take 40 mg by mouth daily.    Marland Kitchen ibuprofen (ADVIL,MOTRIN) 800 MG tablet Take 1 tablet (800 mg total) by mouth 3 (three) times daily. 30 tablet 5  . insulin aspart protamine-insulin aspart (NOVOLOG 70/30) (70-30) 100 UNIT/ML injection Inject 15-25 Units into the skin 2 (two) times daily with a meal. 15units in the morning and 25 units at night    . olmesartan (BENICAR) 5 MG tablet Take by mouth daily.    . medroxyPROGESTERone (PROVERA) 10 MG tablet Take 2 tablets (20 mg total) by mouth daily. (Patient not taking: Reported on 04/15/2016) 60 tablet 0  . norethindrone (AYGESTIN) 5 MG tablet Take 2 tablets (10 mg total) by mouth daily. 60 tablet 5  . oxyCODONE-acetaminophen (PERCOCET) 10-325 MG tablet Take 1 tablet by mouth every 6 (six) hours as needed for pain. (Patient not taking: Reported on 05/23/2017) 20 tablet 0   No current facility-administered medications for this visit.     Review of Systems Review of Systems Constitutional: negative for fatigue and weight loss Respiratory: negative for cough and wheezing Cardiovascular: negative for chest pain, fatigue and palpitations Gastrointestinal: negative for abdominal pain and change in bowel habits Genitourinary:POSITIVE for vaginal bleeding with cramping Integument/breast: negative for nipple discharge Musculoskeletal:negative for myalgias Neurological: negative for gait problems and tremors Behavioral/Psych: negative for abusive  relationship, depression Endocrine: negative for temperature intolerance      Blood pressure 136/90, pulse 100, weight 221 lb (100.2 kg).  Physical Exam Physical Exam General:   alert  Skin:   no rash or abnormalities  Lungs:   clear to auscultation bilaterally  Heart:   regular rate and rhythm, S1, S2 normal, no murmur, click, rub or gallop  Breasts:   normal without suspicious masses, skin or nipple changes or axillary nodes  Abdomen:  normal findings: no organomegaly, soft, non-tender and no  hernia  Pelvis:  External genitalia: normal general appearance Urinary system: urethral meatus normal and bladder without fullness, nontender Vaginal: normal without tenderness, induration or masses Cervix: normal appearance Adnexa: normal bimanual exam Uterus: anteverted and non-tender, normal size    50% of 20 min visit spent on counseling and coordination of care.   Data Reviewed Ultrasound  Assessment     1. Abnormal uterine bleeding (AUB).  Has had an Endometrial Ablation.  Wants a Hysterectomy. Rx: - norethindrone (AYGESTIN) 5 MG tablet; Take 2 tablets (10 mg total) by mouth daily.  Dispense: 60 tablet; Refill: 5 - Ambulatory referral to Internal Medicine for medical clearance for a hysterectomy  2. Vaginal discharge Rx: - Cervicovaginal ancillary only  3. History of severe Asthma and COPD - managed by PCP    Plan    Return for further definitive surgical management after medical evaluation and clearance for Hysterectomy   Orders Placed This Encounter  Procedures  . Ambulatory referral to Internal Medicine    Referral Priority:   Routine    Referral Type:   Consultation    Referral Reason:   Specialty Services Required    Requested Specialty:   Internal Medicine    Number of Visits Requested:   1   Meds ordered this encounter  Medications  . norethindrone (AYGESTIN) 5 MG tablet    Sig: Take 2 tablets (10 mg total) by mouth daily.    Dispense:  60 tablet    Refill:  5     Shelly Bombard MD 05-24-2017

## 2017-05-24 ENCOUNTER — Encounter: Payer: Self-pay | Admitting: Obstetrics

## 2017-05-24 LAB — CERVICOVAGINAL ANCILLARY ONLY
BACTERIAL VAGINITIS: NEGATIVE
CANDIDA VAGINITIS: NEGATIVE
Trichomonas: POSITIVE — AB

## 2017-05-25 ENCOUNTER — Other Ambulatory Visit: Payer: Self-pay | Admitting: Obstetrics

## 2017-05-25 DIAGNOSIS — A599 Trichomoniasis, unspecified: Secondary | ICD-10-CM

## 2017-05-25 MED ORDER — TINIDAZOLE 500 MG PO TABS: 2.0000 g | ORAL_TABLET | Freq: Every day | ORAL | 0 refills | Status: DC

## 2017-06-09 ENCOUNTER — Telehealth: Payer: Self-pay | Admitting: *Deleted

## 2017-06-09 NOTE — Telephone Encounter (Signed)
Pt called to office stating she has appt tomorrow and would like to know if she needs to keep appt. Pt states she is still bleeding since last visit. Pt states she is cramping and passing clots. Pt states she is awaiting medical clearance in order to have surgical management. Pt has had recurrent +Trich.  Per Dr Jodi Mourning, pt does not need to keep appt tomorrow. Pt may be scheduled with Dr Rip Harbour to discuss surgical options after she has seen primary provider for clearance. Stress importance of treatment for +Trich for her and partner to prevent re-exposure.

## 2017-06-09 NOTE — Telephone Encounter (Signed)
See previous phone note.  Pt made aware she doesn't need appt and has been rescheduled to see Dr Rip Harbour. Pt advised to take medication for +Trich and discuss at next visit.  Pt made aware that if she is saturating a pad/hour she should go to [facility].  Pt advised to call if any other problems/concerns. Pt states understanding.

## 2017-06-10 ENCOUNTER — Ambulatory Visit: Payer: Medicaid Other | Admitting: Obstetrics

## 2017-06-23 ENCOUNTER — Ambulatory Visit: Payer: Medicaid Other | Admitting: Obstetrics and Gynecology

## 2017-06-29 ENCOUNTER — Ambulatory Visit: Payer: Medicaid Other | Admitting: Obstetrics and Gynecology

## 2017-07-05 ENCOUNTER — Encounter: Payer: Self-pay | Admitting: Obstetrics and Gynecology

## 2017-07-05 ENCOUNTER — Other Ambulatory Visit (HOSPITAL_COMMUNITY)
Admission: RE | Admit: 2017-07-05 | Discharge: 2017-07-05 | Disposition: A | Discharge status: Home / Self Care | Payer: Medicaid Other | Source: Ambulatory Visit | Attending: Obstetrics and Gynecology | Admitting: Obstetrics and Gynecology

## 2017-07-05 ENCOUNTER — Ambulatory Visit (INDEPENDENT_AMBULATORY_CARE_PROVIDER_SITE_OTHER): Payer: Medicaid Other | Admitting: Obstetrics and Gynecology

## 2017-07-05 VITALS — BP 169/103 | HR 85 | Wt 232.0 lb

## 2017-07-05 DIAGNOSIS — A599 Trichomoniasis, unspecified: Secondary | ICD-10-CM | POA: Insufficient documentation

## 2017-07-05 DIAGNOSIS — N921 Excessive and frequent menstruation with irregular cycle: Secondary | ICD-10-CM | POA: Diagnosis not present

## 2017-07-05 NOTE — Progress Notes (Signed)
Ms Osorno presents for evalution for hysterectomy d/t AUB. Pt has had some degree of vaginal bleeding since 04/29/17. Unresponsive to medical therapy. H/O Endometrial ablation 3 yrs ago. No bleeding until now. EMBX at that time was normal H/O c section x 2 H/O severe asthma/COPD Just stopped smoking  PE  AF  BP elevated x 2  Lungs wheezing Heart RRR Abd soft + BS obese  A/P AUB        Elevated BP        Asthma/COPD        Trich  Pt obtaining surgerical clearance from PCP. Will check GYN U/S. Discussed possible EMBX TOC for tric today. F/U in 3 - 4 weeks

## 2017-07-06 LAB — CERVICOVAGINAL ANCILLARY ONLY: TRICH (WINDOWPATH): POSITIVE — AB

## 2017-07-08 ENCOUNTER — Ambulatory Visit (HOSPITAL_COMMUNITY): Admission: RE | Admit: 2017-07-08 | Payer: Medicaid Other | Source: Ambulatory Visit

## 2017-07-11 ENCOUNTER — Telehealth: Payer: Self-pay

## 2017-07-11 ENCOUNTER — Other Ambulatory Visit: Payer: Self-pay

## 2017-07-11 MED ORDER — METRONIDAZOLE 500 MG PO TABS: 500.0000 mg | ORAL_TABLET | Freq: Two times a day (BID) | ORAL | 0 refills | Status: AC

## 2017-07-11 NOTE — Progress Notes (Signed)
Rx Flagyl sent per Dr. Rip Harbour

## 2017-07-11 NOTE — Progress Notes (Signed)
Left message to call office

## 2017-07-11 NOTE — Telephone Encounter (Signed)
-----   Message from Chancy Milroy, MD sent at 07/11/2017 10:21 AM EDT ----- Please send in Flagyl 500 mg po bid x 7 days for trich Reframe for IC until TOC completed in 2-3 weeks after medication Partner needs to be seen and evaluated as well Thanks Legrand Como

## 2017-07-11 NOTE — Telephone Encounter (Signed)
Left VM message to call office.

## 2017-07-12 ENCOUNTER — Telehealth: Payer: Self-pay

## 2017-07-12 NOTE — Progress Notes (Signed)
Patient notified of results, RX and need to abstain from [place]. Partner to be treated. TOC 2-3 weeks.

## 2017-07-12 NOTE — Telephone Encounter (Signed)
Patient notified, instructions given.  She verbalized understanding.

## 2017-07-12 NOTE — Telephone Encounter (Signed)
-----   Message from Chancy Milroy, MD sent at 07/11/2017 10:21 AM EDT ----- Please send in Flagyl 500 mg po bid x 7 days for trich Reframe for IC until TOC completed in 2-3 weeks after medication Partner needs to be seen and evaluated as well Thanks Legrand Como

## 2017-07-25 ENCOUNTER — Telehealth: Payer: Self-pay

## 2017-07-25 ENCOUNTER — Ambulatory Visit: Payer: Medicaid Other | Admitting: Obstetrics and Gynecology

## 2017-07-25 DIAGNOSIS — N921 Excessive and frequent menstruation with irregular cycle: Secondary | ICD-10-CM

## 2017-07-25 NOTE — Telephone Encounter (Signed)
Called patient to see if she ever got her US done, pt states she never received a call to get this scheduled. I advised pt I am resending the order and she should be getting a call to get the [place] scheduled.

## 2017-08-19 ENCOUNTER — Ambulatory Visit (HOSPITAL_COMMUNITY): Admission: RE | Admit: 2017-08-19 | Payer: Medicaid Other | Source: Ambulatory Visit

## 2017-08-22 ENCOUNTER — Ambulatory Visit (HOSPITAL_COMMUNITY)
Admission: RE | Admit: 2017-08-22 | Discharge: 2017-08-22 | Disposition: A | Discharge status: Home / Self Care | Payer: Medicaid Other | Source: Ambulatory Visit | Attending: Obstetrics and Gynecology | Admitting: Obstetrics and Gynecology

## 2017-08-22 DIAGNOSIS — N921 Excessive and frequent menstruation with irregular cycle: Secondary | ICD-10-CM | POA: Insufficient documentation

## 2017-08-23 ENCOUNTER — Other Ambulatory Visit (HOSPITAL_COMMUNITY)
Admission: RE | Admit: 2017-08-23 | Discharge: 2017-08-23 | Disposition: A | Discharge status: Home / Self Care | Payer: Medicaid Other | Source: Ambulatory Visit | Attending: Obstetrics and Gynecology | Admitting: Obstetrics and Gynecology

## 2017-08-23 ENCOUNTER — Ambulatory Visit (INDEPENDENT_AMBULATORY_CARE_PROVIDER_SITE_OTHER): Payer: Medicaid Other | Admitting: Obstetrics and Gynecology

## 2017-08-23 ENCOUNTER — Encounter: Payer: Self-pay | Admitting: Obstetrics and Gynecology

## 2017-08-23 VITALS — BP 128/91 | HR 76 | Wt 228.0 lb

## 2017-08-23 DIAGNOSIS — A599 Trichomoniasis, unspecified: Secondary | ICD-10-CM

## 2017-08-23 DIAGNOSIS — N921 Excessive and frequent menstruation with irregular cycle: Secondary | ICD-10-CM

## 2017-08-23 MED ORDER — MEDROXYPROGESTERONE ACETATE 10 MG PO TABS: 10.0000 mg | ORAL_TABLET | Freq: Every day | ORAL | 2 refills | Status: DC

## 2017-08-23 NOTE — Progress Notes (Signed)
Pt presents to f/u after u/s. TOC Trich today.

## 2017-08-23 NOTE — Progress Notes (Signed)
Patient ID: Andrea Montgomery, female   DOB: 1975/08/01, 42 y.o.   MRN: [id number] [name] presents for follow up from her GYN U/S and trich. GYN U/S WNL, reviewed with pt. Pt still has some spotting each day and some cramps at times Reports completed medication for trich and partner has been treated as well  PE AF VSS Lungs faint wheezing noted Heart RRR Abd soft + BS  A/P DUB        Trich  Pt continues to desire definite therapy. Pt is working on pre-op clearance d/t medical problems. Pt instructed to complete. Once complete pt will return for pre op appt and further discussion of hysterectomy. TOC for tric obtained today. F/U per test results and for pre op appt. Provera qd in the meantime for bleeding control.

## 2017-08-24 LAB — CERVICOVAGINAL ANCILLARY ONLY: TRICH (WINDOWPATH): POSITIVE — AB

## 2017-08-26 ENCOUNTER — Other Ambulatory Visit: Payer: Self-pay

## 2017-08-26 ENCOUNTER — Other Ambulatory Visit: Payer: Self-pay | Admitting: Pain Medicine

## 2017-08-26 DIAGNOSIS — A599 Trichomoniasis, unspecified: Secondary | ICD-10-CM

## 2017-08-26 DIAGNOSIS — M545 Low back pain: Secondary | ICD-10-CM

## 2017-08-26 MED ORDER — METRONIDAZOLE 500 MG PO TABS: 500.0000 mg | ORAL_TABLET | Freq: Two times a day (BID) | ORAL | 0 refills | Status: DC

## 2017-09-04 ENCOUNTER — Ambulatory Visit
Admission: RE | Admit: 2017-09-04 | Discharge: 2017-09-04 | Disposition: A | Discharge status: Home / Self Care | Payer: Medicaid Other | Source: Ambulatory Visit | Attending: Pain Medicine | Admitting: Pain Medicine

## 2017-09-04 DIAGNOSIS — M545 Low back pain: Secondary | ICD-10-CM

## 2017-09-21 ENCOUNTER — Encounter (HOSPITAL_COMMUNITY): Payer: Self-pay | Admitting: Emergency Medicine

## 2017-09-21 ENCOUNTER — Emergency Department (HOSPITAL_COMMUNITY)
Admission: EM | Admit: 2017-09-21 | Discharge: 2017-09-21 | Disposition: A | Discharge status: Home / Self Care | Payer: Medicaid Other | Attending: Emergency Medicine | Admitting: Emergency Medicine

## 2017-09-21 ENCOUNTER — Emergency Department (HOSPITAL_COMMUNITY): Payer: Medicaid Other

## 2017-09-21 DIAGNOSIS — Z794 Long term (current) use of insulin: Secondary | ICD-10-CM | POA: Insufficient documentation

## 2017-09-21 DIAGNOSIS — I10 Essential (primary) hypertension: Secondary | ICD-10-CM | POA: Diagnosis not present

## 2017-09-21 DIAGNOSIS — K760 Fatty (change of) liver, not elsewhere classified: Secondary | ICD-10-CM | POA: Diagnosis not present

## 2017-09-21 DIAGNOSIS — F17210 Nicotine dependence, cigarettes, uncomplicated: Secondary | ICD-10-CM | POA: Diagnosis not present

## 2017-09-21 DIAGNOSIS — R109 Unspecified abdominal pain: Secondary | ICD-10-CM | POA: Diagnosis present

## 2017-09-21 DIAGNOSIS — E119 Type 2 diabetes mellitus without complications: Secondary | ICD-10-CM | POA: Diagnosis not present

## 2017-09-21 DIAGNOSIS — R1084 Generalized abdominal pain: Secondary | ICD-10-CM | POA: Insufficient documentation

## 2017-09-21 DIAGNOSIS — Z79899 Other long term (current) drug therapy: Secondary | ICD-10-CM | POA: Insufficient documentation

## 2017-09-21 DIAGNOSIS — J449 Chronic obstructive pulmonary disease, unspecified: Secondary | ICD-10-CM | POA: Insufficient documentation

## 2017-09-21 LAB — COMPREHENSIVE METABOLIC PANEL
ALT: 30 U/L (ref 0–44)
ANION GAP: 15 (ref 5–15)
AST: 27 U/L (ref 15–41)
Albumin: 4.3 g/dL (ref 3.5–5.0)
Alkaline Phosphatase: 46 U/L (ref 38–126)
BUN: 6 mg/dL (ref 6–20)
CHLORIDE: 99 mmol/L (ref 98–111)
CO2: 24 mmol/L (ref 22–32)
CREATININE: 0.71 mg/dL (ref 0.44–1.00)
Calcium: 9.6 mg/dL (ref 8.9–10.3)
Glucose, Bld: 210 mg/dL — ABNORMAL HIGH (ref 70–99)
Potassium: 3.9 mmol/L (ref 3.5–5.1)
Sodium: 138 mmol/L (ref 135–145)
Total Bilirubin: 0.3 mg/dL (ref 0.3–1.2)
Total Protein: 8.1 g/dL (ref 6.5–8.1)

## 2017-09-21 LAB — I-STAT BETA HCG BLOOD, ED (MC, WL, AP ONLY)

## 2017-09-21 LAB — I-STAT CHEM 8, ED
BUN: 6 mg/dL (ref 6–20)
CALCIUM ION: 1.12 mmol/L — AB (ref 1.15–1.40)
CHLORIDE: 100 mmol/L (ref 98–111)
Creatinine, Ser: 0.6 mg/dL (ref 0.44–1.00)
Glucose, Bld: 233 mg/dL — ABNORMAL HIGH (ref 70–99)
HEMATOCRIT: 47 % — AB (ref 36.0–46.0)
Hemoglobin: 16 g/dL — ABNORMAL HIGH (ref 12.0–15.0)
Potassium: 4 mmol/L (ref 3.5–5.1)
SODIUM: 138 mmol/L (ref 135–145)
TCO2: 25 mmol/L (ref 22–32)

## 2017-09-21 LAB — CBC WITH DIFFERENTIAL/PLATELET
ABS IMMATURE GRANULOCYTES: 0.1 10*3/uL (ref 0.0–0.1)
Basophils Absolute: 0 10*3/uL (ref 0.0–0.1)
Basophils Relative: 0 %
Eosinophils Absolute: 0 10*3/uL (ref 0.0–0.7)
Eosinophils Relative: 0 %
HCT: 44.3 % (ref 36.0–46.0)
HEMOGLOBIN: 13.8 g/dL (ref 12.0–15.0)
Immature Granulocytes: 1 %
LYMPHS PCT: 8 %
Lymphs Abs: 1.2 10*3/uL (ref 0.7–4.0)
MCH: 27.8 pg (ref 26.0–34.0)
MCHC: 31.2 g/dL (ref 30.0–36.0)
MCV: 89.1 fL (ref 78.0–100.0)
MONO ABS: 0.5 10*3/uL (ref 0.1–1.0)
Monocytes Relative: 3 %
NEUTROS ABS: 13.9 10*3/uL — AB (ref 1.7–7.7)
Neutrophils Relative %: 88 %
Platelets: 357 10*3/uL (ref 150–400)
RBC: 4.97 MIL/uL (ref 3.87–5.11)
RDW: 12.4 % (ref 11.5–15.5)
WBC: 15.6 10*3/uL — ABNORMAL HIGH (ref 4.0–10.5)

## 2017-09-21 LAB — URINALYSIS, ROUTINE W REFLEX MICROSCOPIC
BACTERIA UA: NONE SEEN
Bilirubin Urine: NEGATIVE
Glucose, UA: >=500 mg/dL — AB
Hgb urine dipstick: NEGATIVE
KETONES UR: 80 mg/dL — AB
Leukocytes, UA: NEGATIVE
Nitrite: NEGATIVE
PROTEIN: 100 mg/dL — AB
Specific Gravity, Urine: 1.023 (ref 1.005–1.030)
pH: 7 (ref 5.0–8.0)

## 2017-09-21 LAB — POC URINE PREG, ED: PREG TEST UR: NEGATIVE

## 2017-09-21 LAB — CBG MONITORING, ED: GLUCOSE-CAPILLARY: 206 mg/dL — AB (ref 70–99)

## 2017-09-21 LAB — LIPASE, BLOOD: Lipase: 22 U/L (ref 11–51)

## 2017-09-21 MED ORDER — IOHEXOL 300 MG/ML  SOLN
100.0000 mL | Freq: Once | INTRAMUSCULAR | Status: AC | PRN
  Administered 2017-09-21: 100 mL via INTRAVENOUS

## 2017-09-21 MED ORDER — SODIUM CHLORIDE 0.9 % IV BOLUS
1000.0000 mL | Freq: Once | INTRAVENOUS | Status: AC
  Administered 2017-09-21: 1000 mL via INTRAVENOUS

## 2017-09-21 MED ORDER — HYDROMORPHONE HCL 1 MG/ML IJ SOLN
1.0000 mg | Freq: Once | INTRAMUSCULAR | Status: AC
  Administered 2017-09-21: 1 mg via INTRAVENOUS
  Filled 2017-09-21: qty 1

## 2017-09-21 MED ORDER — ACETAMINOPHEN 500 MG PO TABS
500.0000 mg | ORAL_TABLET | Freq: Once | ORAL | Status: AC
  Administered 2017-09-21: 500 mg via ORAL
  Filled 2017-09-21: qty 1

## 2017-09-21 MED ORDER — ONDANSETRON HCL 4 MG/2ML IJ SOLN
4.0000 mg | Freq: Once | INTRAMUSCULAR | Status: DC
  Filled 2017-09-21: qty 2

## 2017-09-21 MED ORDER — ONDANSETRON HCL 4 MG PO TABS
4.0000 mg | ORAL_TABLET | Freq: Once | ORAL | Status: DC
  Filled 2017-09-21: qty 1

## 2017-09-21 MED ORDER — ONDANSETRON 4 MG PO TBDP: 4.0000 mg | ORAL_TABLET | Freq: Three times a day (TID) | ORAL | 0 refills | Status: DC | PRN

## 2017-09-21 MED ORDER — HYDROMORPHONE HCL 1 MG/ML IJ SOLN: 1.0000 mg | Freq: Once | INTRAMUSCULAR | Status: DC

## 2017-09-21 MED ORDER — HYDROMORPHONE HCL 1 MG/ML IJ SOLN
1.0000 mg | Freq: Once | INTRAMUSCULAR | Status: AC
  Administered 2017-09-21: 1 mg via INTRAMUSCULAR
  Filled 2017-09-21: qty 1

## 2017-09-21 NOTE — ED Provider Notes (Signed)
[facility] Provider Note   CSN: [id number] Arrival date & time: 09/21/17  1240     History   Chief Complaint Chief Complaint  Patient presents with  . Abdominal Pain    HPI Andrea Montgomery is a 42 y.o. female.  The history is provided by the patient, the spouse and medical records. No language interpreter was used.  Abdominal Pain       42 year old female presents with sudden onset of abd pain at 2am this morning.  History obtained through patient and through husband who is at bedside.  Patient report pain primarily to her abdomen radiates to the back, described as "someone is kicking me with a boot" pain is moderate to severe, she felt nauseous and vomited multiple times this morning without any blood.  Pain seems to be worsening with movement.  Pain does radiate to both legs.  No report of fever, chills, lightheadedness, dizziness, chest pain, trouble breathing, hematuria, or dysuria.  Patient report history of stomach cancer currently receiving chemotherapy, last chemo treatment was 2 days ago.  Mentioned that her oncologist is Dr. Lennette Bihari.  She denies any prior history of kidney stone.  History is limited as patient appears to be very uncomfortable.    Past Medical History:  Diagnosis Date  . Anxiety   . Asthma   . COPD (chronic obstructive pulmonary disease) ([place])   . Diabetes mellitus   . Emphysema of lung ([place])   . Hypertension   . Iron deficiency anemia 03/12/2014    Patient Active Problem List   Diagnosis Date Noted  . Trichomonosis 07/05/2017  . Tobacco abuse 07/19/2014  . Knee pain, chronic 03/13/2014  . Abnormal MRI, knee 03/13/2014  . Menorrhagia with irregular cycle 03/13/2014  . Iron deficiency anemia 03/12/2014    Past Surgical History:  Procedure Laterality Date  . ceserean section times 2    . DILITATION & CURRETTAGE/HYSTROSCOPY WITH HYDROTHERMAL ABLATION N/A 10/25/2014   Procedure: DILATATION &  CURETTAGE/HYSTEROSCOPY WITH HYDROTHERMAL ABLATION;  Surgeon: Shelly Bombard, MD;  Location: [facility];  Service: Gynecology;  Laterality: N/A;  . TUBAL LIGATION    . tumor removed from leg       OB History   None      Home Medications    Prior to Admission medications   Medication Sig Start Date End Date Taking? Authorizing Provider  albuterol (PROVENTIL HFA;VENTOLIN HFA) 108 (90 BASE) MCG/ACT inhaler Inhale 2 puffs into the lungs every 6 (six) hours as needed for wheezing or shortness of breath.     [provider]  ALPRAZolam Duanne Moron) 1 MG tablet Take 1 mg by mouth 4 (four) times daily.    [provider]  cyclobenzaprine (FLEXERIL) 10 MG tablet Take 1 tablet (10 mg total) by mouth 2 (two) times daily as needed for muscle spasms. 03/17/16   Domenic Moras, PA-C  Fluticasone-Salmeterol (ADVAIR DISKUS) 250-50 MCG/DOSE AEPB Inhale 1 puff into the lungs every 12 (twelve) hours. For shortness of breath    [provider]  furosemide (LASIX) 40 MG tablet Take 40 mg by mouth daily.    [provider]  ibuprofen (ADVIL,MOTRIN) 800 MG tablet Take 1 tablet (800 mg total) by mouth 3 (three) times daily. 04/29/17   Shelly Bombard, MD  insulin aspart protamine-insulin aspart (NOVOLOG 70/30) (70-30) 100 UNIT/ML injection Inject 15-25 Units into the skin 2 (two) times daily with a meal. 15units in the morning and 25 units at  night    [provider]  losartan (COZAAR) 50 MG tablet Take 50 mg by mouth daily.    [provider]  medroxyPROGESTERone (PROVERA) 10 MG tablet Take 1 tablet (10 mg total) by mouth daily. 08/23/17   Chancy Milroy, MD  metroNIDAZOLE (FLAGYL) 500 MG tablet Take 1 tablet (500 mg total) by mouth 2 (two) times daily. 08/26/17   Chancy Milroy, MD  olmesartan (BENICAR) 5 MG tablet Take by mouth daily.    [provider]    Family History Family History  Problem Relation Age of Onset  . Hypertension Mother   . Heart  disease Mother     Social History Social History   Tobacco Use  . Smoking status: Current Every Day Smoker    Types: Cigarettes  . Smokeless tobacco: Never Used  . Tobacco comment: 2 cigarettes/day  Substance Use Topics  . Alcohol use: No    Alcohol/week: 0.0 standard drinks  . Drug use: No     Allergies   Patient has no known allergies.   Review of Systems Review of Systems  Gastrointestinal: Positive for abdominal pain.  All other systems reviewed and are negative.    Physical Exam Updated Vital Signs BP (!) 186/85   Pulse 81   Temp 97.8 F (36.6 C) (Oral)   Resp (!) 23   SpO2 100%   Physical Exam  Constitutional: She appears well-developed and well-nourished. No distress.  Obese female appears uncomfortable, writhing in bed  HENT:  Head: Atraumatic.  Eyes: Conjunctivae are normal.  Neck: Neck supple.  Cardiovascular: Normal rate, regular rhythm and intact distal pulses.  Pulmonary/Chest: Effort normal and breath sounds normal.  Abdominal: Soft. Normal appearance. Bowel sounds are decreased. There is generalized tenderness. There is no CVA tenderness. No hernia. Hernia confirmed negative in the ventral area.  No abdominal pulsatile mass  Neurological: She is alert.  Skin: No rash noted.  Psychiatric: She has a normal mood and affect.  Nursing note and vitals reviewed.    ED Treatments / Results  Labs (all labs ordered are listed, but only abnormal results are displayed) Labs Reviewed  CBC WITH DIFFERENTIAL/PLATELET - Abnormal; Notable for the following components:      Result Value   WBC 15.6 (*)    Neutro Abs 13.9 (*)    All other components within normal limits  URINALYSIS, ROUTINE W REFLEX MICROSCOPIC - Abnormal; Notable for the following components:   Glucose, UA >=500 (*)    Ketones, ur 80 (*)    Protein, ur 100 (*)    All other components within normal limits  I-STAT CHEM 8, ED - Abnormal; Notable for the following components:   Glucose,  Bld 233 (*)    Calcium, Ion 1.12 (*)    Hemoglobin 16.0 (*)    HCT 47.0 (*)    All other components within normal limits  COMPREHENSIVE METABOLIC PANEL  LIPASE, BLOOD  I-STAT BETA HCG BLOOD, ED ([facility], [facility], [facility] ONLY)  POC URINE PREG, ED    EKG EKG Interpretation  Date/Time:  Wednesday September 21 2017 12:58:43 EDT Ventricular Rate:  80 PR Interval:    QRS Duration: 101 QT Interval:  385 QTC Calculation: 445 R Axis:   60 Text Interpretation:  Sinus rhythm RSR' in V1 or V2, probably normal variant ST elev, probable normal early repol pattern No significant change since last tracing Confirmed by Merrily Pew [phone]) on 09/21/2017 3:10:07 PM   Radiology No results found.  Procedures  Procedures (including critical care time)  Medications Ordered in ED Medications  HYDROmorphone (DILAUDID) injection 1 mg (1 mg Intramuscular Given 09/21/17 1332)  HYDROmorphone (DILAUDID) injection 1 mg (1 mg Intravenous Given 09/21/17 1522)  sodium chloride 0.9 % bolus 1,000 mL (1,000 mLs Intravenous New Bag/Given 09/21/17 1522)     Initial Impression / Assessment and Plan / ED Course  I have reviewed the triage vital signs and the nursing notes.  Pertinent labs & imaging results that were available during my care of the patient were reviewed by me and considered in my medical decision making (see chart for details).     BP (!) 189/102   Pulse 81   Temp 97.8 F (36.6 C) (Oral)   Resp (!) 26   SpO2 100%    Final Clinical Impressions(s) / ED Diagnoses   Final diagnoses:  Generalized abdominal pain    ED Discharge Orders         Ordered    ondansetron (ZOFRAN ODT) 4 MG disintegrating tablet  Every 8 hours PRN     09/21/17 2008         1:35 PM Patient with diffuse abdominal pain radiates to back.  No CVA tenderness on exam.  Report history of stomach cancer currently undergoing chemotherapy.  Work-up initiated.  Pain medication ordered.  3:10 PM Difficult to obtain blood as pt is  a difficult stick.  Multiple attempts by IV team and phlebotomist.  Pt continue to endorse pain.  Will continue with pain management.    Pregnancy test is negative, UA shows 80 ketones and 100 protein, no evidence of UTI.  Elevated WBC of 15.6. CMP pending.  IVF given.  Plan to obtain abd/pelvis CT scan.   3:37 PM I have reviewed pt's prior record but did not see any specific notes mentioning cancer of any sort.  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  No active note to verify this.  There are notes from hematologist/oncologist DR. Alvy Bimler but it was in regard to AUB, she is being evaluated by OBGYN for potential hysterectomy in the future.   After receiving pain medication pt report feeling better.  Care discussed with Dr. Rogene Houston.   4:04 PM Patient signed out to oncoming resident who will f/u on abd/pelvis CT and will reassess pending dispo.     Domenic Moras, PA-C 09/22/17 3846    Fredia Sorrow, MD 09/23/17 2008

## 2017-09-21 NOTE — ED Notes (Signed)
IV team at bedside 

## 2017-09-21 NOTE — ED Provider Notes (Signed)
The patient is a 60yoF with PMH cancer who presents with diiffuse abdominal pain undergoing chemo for a malignancy (unable to find records of this). Patient responded well to dilaudid x2. Pending CT abdomen and pelvis.  CT shows mild hepatic steatosis, otherwise within normal limits.  Patient p.o. challenged and was able to tolerate p.o. solids and liquids. Patient reports that she would like to go home, although she still has pain. Patient was given strict return precautions. Patient given prescription for zofran. Patient reports understanding of and agreement with return precautions and discharge plan.  Patient care supervised by Dr. Dayna Barker.  Irven Baltimore, MD   Irven Baltimore, MD 09/22/17 1455    Mesner, Corene Cornea, MD 09/22/17 Pauline Aus

## 2017-09-21 NOTE — ED Notes (Signed)
Patient transported to CT 

## 2017-09-21 NOTE — ED Triage Notes (Signed)
Pt here from home with c/o abd pain that started about 2 am , pain located across  the middle of the abd

## 2017-09-21 NOTE — ED Notes (Signed)
Pt able to eat and drink 

## 2017-09-23 ENCOUNTER — Emergency Department (HOSPITAL_COMMUNITY): Payer: Medicaid Other

## 2017-09-23 ENCOUNTER — Encounter (HOSPITAL_COMMUNITY): Payer: Self-pay | Admitting: Emergency Medicine

## 2017-09-23 ENCOUNTER — Emergency Department (HOSPITAL_COMMUNITY)
Admission: EM | Admit: 2017-09-23 | Discharge: 2017-09-23 | Disposition: A | Discharge status: Home / Self Care | Payer: Medicaid Other | Attending: Emergency Medicine | Admitting: Emergency Medicine

## 2017-09-23 DIAGNOSIS — Z794 Long term (current) use of insulin: Secondary | ICD-10-CM | POA: Insufficient documentation

## 2017-09-23 DIAGNOSIS — J449 Chronic obstructive pulmonary disease, unspecified: Secondary | ICD-10-CM | POA: Insufficient documentation

## 2017-09-23 DIAGNOSIS — K5909 Other constipation: Secondary | ICD-10-CM

## 2017-09-23 DIAGNOSIS — R1084 Generalized abdominal pain: Secondary | ICD-10-CM | POA: Diagnosis not present

## 2017-09-23 DIAGNOSIS — E119 Type 2 diabetes mellitus without complications: Secondary | ICD-10-CM | POA: Diagnosis not present

## 2017-09-23 DIAGNOSIS — Z79899 Other long term (current) drug therapy: Secondary | ICD-10-CM | POA: Diagnosis not present

## 2017-09-23 DIAGNOSIS — R1032 Left lower quadrant pain: Secondary | ICD-10-CM | POA: Diagnosis present

## 2017-09-23 DIAGNOSIS — F17210 Nicotine dependence, cigarettes, uncomplicated: Secondary | ICD-10-CM | POA: Insufficient documentation

## 2017-09-23 DIAGNOSIS — K5900 Constipation, unspecified: Secondary | ICD-10-CM | POA: Insufficient documentation

## 2017-09-23 DIAGNOSIS — I10 Essential (primary) hypertension: Secondary | ICD-10-CM | POA: Diagnosis not present

## 2017-09-23 LAB — CBC WITH DIFFERENTIAL/PLATELET
Abs Immature Granulocytes: 0.1 10*3/uL (ref 0.0–0.1)
BASOS PCT: 0 %
Basophils Absolute: 0 10*3/uL (ref 0.0–0.1)
EOS ABS: 0 10*3/uL (ref 0.0–0.7)
Eosinophils Relative: 0 %
HCT: 43.1 % (ref 36.0–46.0)
Hemoglobin: 13.2 g/dL (ref 12.0–15.0)
Immature Granulocytes: 1 %
Lymphocytes Relative: 15 %
Lymphs Abs: 2.4 10*3/uL (ref 0.7–4.0)
MCH: 27.3 pg (ref 26.0–34.0)
MCHC: 30.6 g/dL (ref 30.0–36.0)
MCV: 89 fL (ref 78.0–100.0)
MONO ABS: 1 10*3/uL (ref 0.1–1.0)
MONOS PCT: 7 %
NEUTROS PCT: 77 %
Neutro Abs: 12 10*3/uL — ABNORMAL HIGH (ref 1.7–7.7)
PLATELETS: 328 10*3/uL (ref 150–400)
RBC: 4.84 MIL/uL (ref 3.87–5.11)
RDW: 12.3 % (ref 11.5–15.5)
WBC: 15.6 10*3/uL — ABNORMAL HIGH (ref 4.0–10.5)

## 2017-09-23 LAB — URINALYSIS, ROUTINE W REFLEX MICROSCOPIC
Bilirubin Urine: NEGATIVE
KETONES UR: 20 mg/dL — AB
LEUKOCYTES UA: NEGATIVE
Nitrite: NEGATIVE
Specific Gravity, Urine: 1.024 (ref 1.005–1.030)
pH: 6 (ref 5.0–8.0)

## 2017-09-23 LAB — COMPREHENSIVE METABOLIC PANEL
ALT: 23 U/L (ref 0–44)
AST: 28 U/L (ref 15–41)
Albumin: 3.8 g/dL (ref 3.5–5.0)
Alkaline Phosphatase: 43 U/L (ref 38–126)
Anion gap: 13 (ref 5–15)
BUN: 7 mg/dL (ref 6–20)
CHLORIDE: 96 mmol/L — AB (ref 98–111)
CO2: 27 mmol/L (ref 22–32)
CREATININE: 0.87 mg/dL (ref 0.44–1.00)
Calcium: 8.8 mg/dL — ABNORMAL LOW (ref 8.9–10.3)
Glucose, Bld: 156 mg/dL — ABNORMAL HIGH (ref 70–99)
POTASSIUM: 3.3 mmol/L — AB (ref 3.5–5.1)
Sodium: 136 mmol/L (ref 135–145)
Total Bilirubin: 0.8 mg/dL (ref 0.3–1.2)
Total Protein: 7 g/dL (ref 6.5–8.1)

## 2017-09-23 LAB — LIPASE, BLOOD: LIPASE: 18 U/L (ref 11–51)

## 2017-09-23 LAB — PREGNANCY, URINE: PREG TEST UR: NEGATIVE

## 2017-09-23 MED ORDER — HYDROMORPHONE HCL 1 MG/ML IJ SOLN
1.0000 mg | Freq: Once | INTRAMUSCULAR | Status: AC
  Administered 2017-09-23: 1 mg via INTRAVENOUS
  Filled 2017-09-23: qty 1

## 2017-09-23 MED ORDER — POLYETHYLENE GLYCOL 3350 17 G PO PACK: 17.0000 g | PACK | Freq: Every day | ORAL | 0 refills | Status: DC

## 2017-09-23 MED ORDER — SODIUM CHLORIDE 0.9 % IV BOLUS
1000.0000 mL | Freq: Once | INTRAVENOUS | Status: AC
  Administered 2017-09-23: 1000 mL via INTRAVENOUS

## 2017-09-23 MED ORDER — POTASSIUM CHLORIDE CRYS ER 20 MEQ PO TBCR
20.0000 meq | EXTENDED_RELEASE_TABLET | Freq: Once | ORAL | Status: AC
  Administered 2017-09-23: 20 meq via ORAL
  Filled 2017-09-23: qty 1

## 2017-09-23 MED ORDER — ONDANSETRON 4 MG PO TBDP: 4.0000 mg | ORAL_TABLET | Freq: Three times a day (TID) | ORAL | 0 refills | Status: DC | PRN

## 2017-09-23 MED ORDER — PROMETHAZINE HCL 25 MG/ML IJ SOLN
25.0000 mg | Freq: Once | INTRAMUSCULAR | Status: AC
  Administered 2017-09-23: 25 mg via INTRAVENOUS
  Filled 2017-09-23: qty 1

## 2017-09-23 NOTE — ED Provider Notes (Signed)
[facility]PART[facility] Provider Note   CSN: [id number] Arrival date & time: 09/23/17  0707     History   Chief Complaint Chief Complaint  Patient presents with  . Abdominal Pain    HPI Andrea Montgomery is a 42 y.o. female.  The history is provided by the patient. No language interpreter was used.  Abdominal Pain   This is a new problem. The current episode started more than 2 days ago. The problem occurs constantly. The problem has been rapidly worsening. The pain is located in the LLQ. The pain is moderate. Pertinent negatives include fever. Nothing aggravates the symptoms. Nothing relieves the symptoms.  Pt complains of left lower abdominal pain.  Pt reports no bowel movement in 3 days    Past Medical History:  Diagnosis Date  . Anxiety   . Asthma   . COPD (chronic obstructive pulmonary disease) ([place])   . Diabetes mellitus   . Emphysema of lung ([place])   . Hypertension   . Iron deficiency anemia 03/12/2014    Patient Active Problem List   Diagnosis Date Noted  . Trichomonosis 07/05/2017  . Tobacco abuse 07/19/2014  . Knee pain, chronic 03/13/2014  . Abnormal MRI, knee 03/13/2014  . Menorrhagia with irregular cycle 03/13/2014  . Iron deficiency anemia 03/12/2014    Past Surgical History:  Procedure Laterality Date  . ceserean section times 2    . DILITATION & CURRETTAGE/HYSTROSCOPY WITH HYDROTHERMAL ABLATION N/A 10/25/2014   Procedure: DILATATION & CURETTAGE/HYSTEROSCOPY WITH HYDROTHERMAL ABLATION;  Surgeon: Shelly Bombard, MD;  Location: [facility];  Service: Gynecology;  Laterality: N/A;  . TUBAL LIGATION    . tumor removed from leg       OB History   None      Home Medications    Prior to Admission medications   Medication Sig Start Date End Date Taking? Authorizing Provider  albuterol (PROVENTIL HFA;VENTOLIN HFA) 108 (90 BASE) MCG/ACT inhaler Inhale 2 puffs into the lungs every 6 (six) hours as needed for wheezing or shortness  of breath.    Yes [provider]  ALPRAZolam Duanne Moron) 1 MG tablet Take 1 mg by mouth 4 (four) times daily.   Yes [provider]  Fluticasone-Salmeterol (ADVAIR DISKUS) 250-50 MCG/DOSE AEPB Inhale 1 puff into the lungs every 12 (twelve) hours. For shortness of breath   Yes [provider]  furosemide (LASIX) 40 MG tablet Take 40 mg by mouth daily.   Yes [provider]  HYDROcodone-acetaminophen (NORCO) 10-325 MG tablet Take 1 tablet by mouth as needed. 08/25/17  Yes [provider]  ibuprofen (ADVIL,MOTRIN) 800 MG tablet Take 1 tablet (800 mg total) by mouth 3 (three) times daily. 04/29/17  Yes Shelly Bombard, MD  insulin aspart protamine-insulin aspart (NOVOLOG 70/30) (70-30) 100 UNIT/ML injection Inject 25 Units into the skin 2 (two) times daily with a meal.    Yes [provider]  insulin glargine (LANTUS) 100 UNIT/ML injection Inject 15 Units into the skin at bedtime.   Yes [provider]  losartan (COZAAR) 50 MG tablet Take 50 mg by mouth daily.   Yes [provider]  metFORMIN (GLUCOPHAGE) 1000 MG tablet Take 1,000 mg by mouth 2 (two) times daily with a meal.   Yes [provider]  ondansetron (ZOFRAN ODT) 4 MG disintegrating tablet Take 1 tablet (4 mg total) by mouth every 8 (eight) hours as needed for nausea or vomiting. 09/23/17   Fransico Meadow, PA-C  polyethylene glycol (MIRALAX) packet Take 17 g by mouth daily. 09/23/17   Fransico Meadow, PA-C    Family History Family History  Problem Relation Age of Onset  . Hypertension Mother   . Heart disease Mother     Social History Social History   Tobacco Use  . Smoking status: Current Every Day Smoker    Types: Cigarettes  . Smokeless tobacco: Never Used  . Tobacco comment: 2 cigarettes/day  Substance Use Topics  . Alcohol use: No    Alcohol/week: 0.0 standard drinks  . Drug use: No     Allergies   Patient has no known allergies.   Review of  Systems Review of Systems  Constitutional: Negative for fever.  Gastrointestinal: Positive for abdominal pain.  All other systems reviewed and are negative.    Physical Exam Updated Vital Signs BP 112/80   Pulse 88   Temp 98.4 F (36.9 C) (Oral)   Resp 12   LMP 09/15/2017 Comment: neg preg test 09/23/17  SpO2 98%   Physical Exam  Constitutional: She appears well-developed and well-nourished.  HENT:  Head: Normocephalic.  Mouth/Throat: Oropharynx is clear and moist.  Eyes: Pupils are equal, round, and reactive to light.  Cardiovascular: Normal rate and regular rhythm.  Pulmonary/Chest: Effort normal.  Abdominal: Normal appearance and bowel sounds are normal. There is generalized tenderness and tenderness in the left lower quadrant.  Neurological: She is alert.  Skin: Skin is warm.  Psychiatric: She has a normal mood and affect.  Nursing note and vitals reviewed.    ED Treatments / Results  Labs (all labs ordered are listed, but only abnormal results are displayed) Labs Reviewed  CBC WITH DIFFERENTIAL/PLATELET - Abnormal; Notable for the following components:      Result Value   WBC 15.6 (*)    Neutro Abs 12.0 (*)    All other components within normal limits  COMPREHENSIVE METABOLIC PANEL - Abnormal; Notable for the following components:   Potassium 3.3 (*)    Chloride 96 (*)    Glucose, Bld 156 (*)    Calcium 8.8 (*)    All other components within normal limits  URINALYSIS, ROUTINE W REFLEX MICROSCOPIC - Abnormal; Notable for the following components:   APPearance HAZY (*)    Glucose, UA >=500 (*)    Hgb urine dipstick SMALL (*)    Ketones, ur 20 (*)    Protein, ur >=300 (*)    Bacteria, UA RARE (*)    All other components within normal limits  LIPASE, BLOOD  PREGNANCY, URINE    EKG None  Radiology Ct Abdomen Pelvis W Contrast  Result Date: 09/21/2017 CLINICAL DATA:  Acute onset of diffuse abdominal pain beginning this morning. EXAM: CT ABDOMEN AND  PELVIS WITH CONTRAST TECHNIQUE: Multidetector CT imaging of the abdomen and pelvis was performed using the standard protocol following bolus administration of intravenous contrast. CONTRAST:  126mL OMNIPAQUE IOHEXOL 300 MG/ML  SOLN COMPARISON:  12/22/2008 FINDINGS: Lower Chest: No acute findings. Hepatobiliary: No hepatic masses identified. Mild diffuse hepatic steatosis. Gallbladder is unremarkable. Pancreas:  No mass or inflammatory changes. Spleen: Within normal limits in size and appearance. Adrenals/Urinary Tract: No masses identified. No evidence of hydronephrosis. Unremarkable unopacified urinary bladder. Stomach/Bowel: No evidence of obstruction, inflammatory process or abnormal fluid collections. Normal appendix visualized. Vascular/Lymphatic: No pathologically enlarged lymph nodes. No abdominal aortic aneurysm. Reproductive:  No mass or other significant abnormality. Other:  None. Musculoskeletal:  No suspicious bone lesions identified. IMPRESSION: No acute findings  within the abdomen or pelvis. Mild hepatic steatosis. Electronically Signed   By: Earle Gell M.D.   On: 09/21/2017 17:05   Dg Abd Acute W/chest  Result Date: 09/23/2017 CLINICAL DATA:  42 year old female with nausea, left lower quadrant abdominal pain, fever and vomiting. EXAM: DG ABDOMEN ACUTE W/ 1V CHEST COMPARISON:  CT Abdomen and Pelvis 09/21/2017. Chest radiographs 05/12/2011. FINDINGS: Semi upright AP view of the chest at 1119 hours. Lower lung volumes. No pneumothorax or pneumoperitoneum. No pulmonary edema, pleural effusion or confluent pulmonary opacity. Mediastinal contours appear stable. Visualized tracheal air column is within normal limits. Supine and left-side-down lateral decubitus views of the abdomen. No pneumoperitoneum. Non obstructed bowel gas pattern. Abdominal and pelvic visceral contours are stable. No osseous abnormality identified. IMPRESSION: 1.  Normal bowel gas pattern, no free air. 2.  No cardiopulmonary  abnormality identified. Electronically Signed   By: Genevie Ann M.D.   On: 09/23/2017 11:46    Procedures Procedures (including critical care time)  Medications Ordered in ED Medications  sodium chloride 0.9 % bolus 1,000 mL (0 mLs Intravenous Stopped 09/23/17 0910)  promethazine (PHENERGAN) injection 25 mg (25 mg Intravenous Given 09/23/17 0757)  HYDROmorphone (DILAUDID) injection 1 mg (1 mg Intravenous Given 09/23/17 0757)  HYDROmorphone (DILAUDID) injection 1 mg (1 mg Intravenous Given 09/23/17 1143)     Initial Impression / Assessment and Plan / ED Course  I have reviewed the triage vital signs and the nursing notes.  Pertinent labs & imaging results that were available during my care of the patient were reviewed by me and considered in my medical decision making (see chart for details).     MDM   Ct from previous visit reviewed,  No acute abnormalities,  Labs and urine obtained and reviewed    Pt had relief with pain medications.  Results reviewed with pt.   I suspect she is constipated.. Pt given rx for zofran for nausea.  Pt also given rx for miralax.  Pt is advised to follow up with her primary MD    Final Clinical Impressions(s) / ED Diagnoses   Final diagnoses:  Generalized abdominal pain  Other constipation    ED Discharge Orders         Ordered    polyethylene glycol (MIRALAX) packet  Daily     09/23/17 1246    ondansetron (ZOFRAN ODT) 4 MG disintegrating tablet  Every 8 hours PRN     09/23/17 1246        An After Visit Summary was printed and given to the patient.   Fransico Meadow, [place] 09/23/17 [place], MD 09/23/17 1325

## 2017-09-23 NOTE — Discharge Instructions (Signed)
See your Gynecologist for recheck

## 2017-09-23 NOTE — ED Triage Notes (Signed)
Pt arrives via EMS with complaints of severe LLQ abd pain that radiates all over abd. 200 mcg fentanyl, 4 mg zofran given. Endorses lower back pain.

## 2018-01-27 ENCOUNTER — Emergency Department (HOSPITAL_COMMUNITY)
Admission: EM | Admit: 2018-01-27 | Discharge: 2018-01-27 | Disposition: A | Discharge status: Home / Self Care | Payer: Medicaid Other | Attending: Emergency Medicine | Admitting: Emergency Medicine

## 2018-01-27 ENCOUNTER — Emergency Department (HOSPITAL_COMMUNITY): Payer: Medicaid Other

## 2018-01-27 ENCOUNTER — Other Ambulatory Visit: Payer: Self-pay

## 2018-01-27 DIAGNOSIS — Z794 Long term (current) use of insulin: Secondary | ICD-10-CM | POA: Diagnosis not present

## 2018-01-27 DIAGNOSIS — N83291 Other ovarian cyst, right side: Secondary | ICD-10-CM | POA: Insufficient documentation

## 2018-01-27 DIAGNOSIS — E119 Type 2 diabetes mellitus without complications: Secondary | ICD-10-CM | POA: Insufficient documentation

## 2018-01-27 DIAGNOSIS — R1084 Generalized abdominal pain: Secondary | ICD-10-CM | POA: Diagnosis present

## 2018-01-27 DIAGNOSIS — F17210 Nicotine dependence, cigarettes, uncomplicated: Secondary | ICD-10-CM | POA: Insufficient documentation

## 2018-01-27 DIAGNOSIS — Z79899 Other long term (current) drug therapy: Secondary | ICD-10-CM | POA: Insufficient documentation

## 2018-01-27 DIAGNOSIS — J449 Chronic obstructive pulmonary disease, unspecified: Secondary | ICD-10-CM | POA: Diagnosis not present

## 2018-01-27 DIAGNOSIS — I10 Essential (primary) hypertension: Secondary | ICD-10-CM | POA: Diagnosis not present

## 2018-01-27 DIAGNOSIS — N83201 Unspecified ovarian cyst, right side: Secondary | ICD-10-CM

## 2018-01-27 LAB — CBC WITH DIFFERENTIAL/PLATELET
Abs Immature Granulocytes: 0.07 10*3/uL (ref 0.00–0.07)
Basophils Absolute: 0 10*3/uL (ref 0.0–0.1)
Basophils Relative: 0 %
Eosinophils Absolute: 0 10*3/uL (ref 0.0–0.5)
Eosinophils Relative: 0 %
HCT: 44.2 % (ref 36.0–46.0)
Hemoglobin: 13.9 g/dL (ref 12.0–15.0)
Immature Granulocytes: 0 %
Lymphocytes Relative: 7 %
Lymphs Abs: 1.2 10*3/uL (ref 0.7–4.0)
MCH: 27.7 pg (ref 26.0–34.0)
MCHC: 31.4 g/dL (ref 30.0–36.0)
MCV: 88 fL (ref 80.0–100.0)
Monocytes Absolute: 0.6 10*3/uL (ref 0.1–1.0)
Monocytes Relative: 3 %
Neutro Abs: 16 10*3/uL — ABNORMAL HIGH (ref 1.7–7.7)
Neutrophils Relative %: 90 %
Platelets: 329 10*3/uL (ref 150–400)
RBC: 5.02 MIL/uL (ref 3.87–5.11)
RDW: 13.2 % (ref 11.5–15.5)
WBC: 17.9 10*3/uL — ABNORMAL HIGH (ref 4.0–10.5)
nRBC: 0 % (ref 0.0–0.2)

## 2018-01-27 LAB — COMPREHENSIVE METABOLIC PANEL
ALT: 23 U/L (ref 0–44)
AST: 28 U/L (ref 15–41)
Albumin: 4.4 g/dL (ref 3.5–5.0)
Alkaline Phosphatase: 44 U/L (ref 38–126)
Anion gap: 12 (ref 5–15)
BUN: <5 mg/dL — ABNORMAL LOW (ref 6–20)
CO2: 24 mmol/L (ref 22–32)
Calcium: 10.1 mg/dL (ref 8.9–10.3)
Chloride: 100 mmol/L (ref 98–111)
Creatinine, Ser: 0.76 mg/dL (ref 0.44–1.00)
GFR calc Af Amer: >60 mL/min (ref >60)
GFR calc non Af Amer: >60 mL/min (ref >60)
Glucose, Bld: 178 mg/dL — ABNORMAL HIGH (ref 70–99)
Potassium: 4.1 mmol/L (ref 3.5–5.1)
Sodium: 136 mmol/L (ref 135–145)
Total Bilirubin: 0.4 mg/dL (ref 0.3–1.2)
Total Protein: 8.2 g/dL — ABNORMAL HIGH (ref 6.5–8.1)

## 2018-01-27 LAB — URINALYSIS, ROUTINE W REFLEX MICROSCOPIC
Bacteria, UA: NONE SEEN
Bilirubin Urine: NEGATIVE
Glucose, UA: 150 mg/dL — AB
Hgb urine dipstick: NEGATIVE
Ketones, ur: 5 mg/dL — AB
Leukocytes, UA: NEGATIVE
Nitrite: NEGATIVE
Protein, ur: 100 mg/dL — AB
Specific Gravity, Urine: 1.012 (ref 1.005–1.030)
pH: 8 (ref 5.0–8.0)

## 2018-01-27 LAB — LIPASE, BLOOD: Lipase: 20 U/L (ref 11–51)

## 2018-01-27 LAB — PREGNANCY, URINE: Preg Test, Ur: NEGATIVE

## 2018-01-27 MED ORDER — HYDROCODONE-ACETAMINOPHEN 5-325 MG PO TABS: 1.0000 | ORAL_TABLET | ORAL | 0 refills | Status: DC | PRN

## 2018-01-27 MED ORDER — SODIUM CHLORIDE 0.9 % IV BOLUS
1000.0000 mL | Freq: Once | INTRAVENOUS | Status: AC
  Administered 2018-01-27: 1000 mL via INTRAVENOUS

## 2018-01-27 MED ORDER — OXYCODONE-ACETAMINOPHEN 5-325 MG PO TABS: 1.0000 | ORAL_TABLET | ORAL | 0 refills | Status: DC | PRN

## 2018-01-27 MED ORDER — HALOPERIDOL LACTATE 5 MG/ML IJ SOLN
2.0000 mg | Freq: Once | INTRAMUSCULAR | Status: AC
  Administered 2018-01-27: 2 mg via INTRAVENOUS
  Filled 2018-01-27: qty 1

## 2018-01-27 MED ORDER — KETOROLAC TROMETHAMINE 15 MG/ML IJ SOLN
15.0000 mg | Freq: Once | INTRAMUSCULAR | Status: AC
  Administered 2018-01-27: 15 mg via INTRAVENOUS
  Filled 2018-01-27: qty 1

## 2018-01-27 MED ORDER — LORAZEPAM 2 MG/ML IJ SOLN
1.0000 mg | Freq: Once | INTRAMUSCULAR | Status: AC
  Administered 2018-01-27: 1 mg via INTRAMUSCULAR
  Filled 2018-01-27: qty 1

## 2018-01-27 MED ORDER — MORPHINE SULFATE (PF) 4 MG/ML IV SOLN
4.0000 mg | Freq: Once | INTRAVENOUS | Status: AC
  Administered 2018-01-27: 4 mg via INTRAVENOUS
  Filled 2018-01-27: qty 1

## 2018-01-27 MED ORDER — DICYCLOMINE HCL 10 MG/ML IM SOLN
20.0000 mg | Freq: Once | INTRAMUSCULAR | Status: AC
  Administered 2018-01-27: 20 mg via INTRAMUSCULAR
  Filled 2018-01-27: qty 2

## 2018-01-27 MED ORDER — HYDROMORPHONE HCL 1 MG/ML IJ SOLN
1.0000 mg | Freq: Once | INTRAMUSCULAR | Status: AC
  Administered 2018-01-27: 1 mg via INTRAMUSCULAR
  Filled 2018-01-27: qty 1

## 2018-01-27 MED ORDER — IOHEXOL 300 MG/ML  SOLN
100.0000 mL | Freq: Once | INTRAMUSCULAR | Status: AC | PRN
  Administered 2018-01-27: 100 mL via INTRAVENOUS

## 2018-01-27 NOTE — ED Triage Notes (Signed)
Pt complaining of generalized abdominal pain that started this morning. Pt also complaining of nausea and vomiting with one episode prior to EMS arrival. EMS reports pt hypertensive with bp of 210/116

## 2018-01-27 NOTE — ED Notes (Signed)
Patient transported to CT 

## 2018-01-27 NOTE — ED Notes (Signed)
Pt 80% on room air when sleeping. Pt placed on 2L via [place] and SpO2 of 98% on 2L  at this time.

## 2018-01-27 NOTE — ED Notes (Signed)
Patient verbalizes understanding of discharge instructions. Opportunity for questioning and answers were provided. Armband removed by staff, pt discharged from ED.  

## 2018-01-27 NOTE — ED Provider Notes (Signed)
[facility] EMERGENCY DEPARTMENT Provider Note   CSN: [id number] Arrival date & time: 01/27/18  1133     History   Chief Complaint Chief Complaint  Patient presents with  . Abdominal Pain    HPI Andrea Montgomery is a 43 y.o. female.  HPI   89yF with abdominal pain. Onset this morning. Persistent since. Diffuse. No appreciable exacerbating or relieving factors. Associated nausea and vomited x1. No fever or chills. No urinary complaints. No unusual vaginal bleeding or discharge.   Past Medical History:  Diagnosis Date  . Anxiety   . Asthma   . COPD (chronic obstructive pulmonary disease) ([place])   . Diabetes mellitus   . Emphysema of lung ([place])   . Hypertension   . Iron deficiency anemia 03/12/2014    Patient Active Problem List   Diagnosis Date Noted  . Trichomonosis 07/05/2017  . Tobacco abuse 07/19/2014  . Knee pain, chronic 03/13/2014  . Abnormal MRI, knee 03/13/2014  . Menorrhagia with irregular cycle 03/13/2014  . Iron deficiency anemia 03/12/2014    Past Surgical History:  Procedure Laterality Date  . ceserean section times 2    . DILITATION & CURRETTAGE/HYSTROSCOPY WITH HYDROTHERMAL ABLATION N/A 10/25/2014   Procedure: DILATATION & CURETTAGE/HYSTEROSCOPY WITH HYDROTHERMAL ABLATION;  Surgeon: Shelly Bombard, MD;  Location: [facility];  Service: Gynecology;  Laterality: N/A;  . TUBAL LIGATION    . tumor removed from leg       OB History   No obstetric history on file.      Home Medications    Prior to Admission medications   Medication Sig Start Date End Date Taking? Authorizing Provider  albuterol (PROVENTIL HFA;VENTOLIN HFA) 108 (90 BASE) MCG/ACT inhaler Inhale 2 puffs into the lungs every 6 (six) hours as needed for wheezing or shortness of breath.     [provider]  ALPRAZolam Duanne Moron) 1 MG tablet Take 1 mg by mouth 4 (four) times daily.    [provider]  Fluticasone-Salmeterol (ADVAIR DISKUS) 250-50 MCG/DOSE  AEPB Inhale 1 puff into the lungs every 12 (twelve) hours. For shortness of breath    [provider]  furosemide (LASIX) 40 MG tablet Take 40 mg by mouth daily.    [provider]  HYDROcodone-acetaminophen (NORCO) 10-325 MG tablet Take 1 tablet by mouth as needed. 08/25/17   [provider]  HYDROcodone-acetaminophen (NORCO/VICODIN) 5-325 MG tablet Take 1 tablet by mouth every 4 (four) hours as needed. 01/27/18   Virgel Manifold, MD  ibuprofen (ADVIL,MOTRIN) 800 MG tablet Take 1 tablet (800 mg total) by mouth 3 (three) times daily. 04/29/17   Shelly Bombard, MD  insulin aspart protamine-insulin aspart (NOVOLOG 70/30) (70-30) 100 UNIT/ML injection Inject 25 Units into the skin 2 (two) times daily with a meal.     [provider]  insulin glargine (LANTUS) 100 UNIT/ML injection Inject 15 Units into the skin at bedtime.    [provider]  losartan (COZAAR) 50 MG tablet Take 50 mg by mouth daily.    [provider]  metFORMIN (GLUCOPHAGE) 1000 MG tablet Take 1,000 mg by mouth 2 (two) times daily with a meal.    [provider]  ondansetron (ZOFRAN ODT) 4 MG disintegrating tablet Take 1 tablet (4 mg total) by mouth every 8 (eight) hours as needed for nausea or vomiting. 09/23/17   Fransico Meadow, PA-C  polyethylene glycol [facility]) packet Take 17 g by mouth daily. 09/23/17   Fransico Meadow, PA-C  Family History Family History  Problem Relation Age of Onset  . Hypertension Mother   . Heart disease Mother     Social History Social History   Tobacco Use  . Smoking status: Current Every Day Smoker    Types: Cigarettes  . Smokeless tobacco: Never Used  . Tobacco comment: 2 cigarettes/day  Substance Use Topics  . Alcohol use: No    Alcohol/week: 0.0 standard drinks  . Drug use: No     Allergies   Patient has no known allergies.   Review of Systems Review of Systems  All systems reviewed and negative, other than as noted  in HPI.  Physical Exam Updated Vital Signs BP (!) 191/97   Pulse 76   Temp 98.8 F (37.1 C) (Axillary)   Resp 16   Ht 5' (1.524 m)   Wt 99.8 kg   SpO2 96%   BMI 42.97 kg/m   Physical Exam Vitals signs and nursing note reviewed.  Constitutional:      General: She is not in acute distress.    Appearance: She is well-developed.  HENT:     Head: Normocephalic and atraumatic.  Eyes:     General:        Right eye: No discharge.        Left eye: No discharge.     Conjunctiva/sclera: Conjunctivae normal.  Neck:     Musculoskeletal: Neck supple.  Cardiovascular:     Rate and Rhythm: Normal rate and regular rhythm.     Heart sounds: Normal heart sounds. No murmur. No friction rub. No gallop.   Pulmonary:     Effort: Pulmonary effort is normal. No respiratory distress.     Breath sounds: Normal breath sounds.  Abdominal:     General: There is no distension.     Palpations: Abdomen is soft.     Tenderness: There is abdominal tenderness.     Comments: Mild tenderness to palpation lower abdomen without rebound or guarding.  No distention.  Musculoskeletal:        General: No tenderness.  Skin:    General: Skin is warm and dry.  Neurological:     Mental Status: She is alert.  Psychiatric:        Behavior: Behavior normal.        Thought Content: Thought content normal.      ED Treatments / Results  Labs (all labs ordered are listed, but only abnormal results are displayed) Labs Reviewed  CBC WITH DIFFERENTIAL/PLATELET - Abnormal; Notable for the following components:      Result Value   WBC 17.9 (*)    Neutro Abs 16.0 (*)    All other components within normal limits  COMPREHENSIVE METABOLIC PANEL - Abnormal; Notable for the following components:   Glucose, Bld 178 (*)    BUN <5 (*)    Total Protein 8.2 (*)    All other components within normal limits  URINALYSIS, ROUTINE W REFLEX MICROSCOPIC - Abnormal; Notable for the following components:   Color, Urine STRAW  (*)    Glucose, UA 150 (*)    Ketones, ur 5 (*)    Protein, ur 100 (*)    All other components within normal limits  PREGNANCY, URINE  LIPASE, BLOOD    EKG None  Radiology Ct Abdomen Pelvis W Contrast  Result Date: 01/27/2018 CLINICAL DATA:  Generalized abdominal pain beginning this morning. Nausea and vomiting x1. EXAM: CT ABDOMEN AND PELVIS WITH CONTRAST TECHNIQUE: Multidetector CT imaging of the  abdomen and pelvis was performed using the standard protocol following bolus administration of intravenous contrast. CONTRAST:  126mL OMNIPAQUE IOHEXOL 300 MG/ML  SOLN COMPARISON:  September 21, 2017 FINDINGS: Lower chest: No acute abnormality. Hepatobiliary: No focal liver abnormality is seen. No gallstones, gallbladder wall thickening, or biliary dilatation. Pancreas: Unremarkable. No pancreatic ductal dilatation or surrounding inflammatory changes. Spleen: Normal in size without focal abnormality. Adrenals/Urinary Tract: Adrenal glands are unremarkable. Kidneys are normal, without renal calculi, focal lesion, or hydronephrosis. Bladder is unremarkable. Stomach/Bowel: Stomach is within normal limits. Appendix appears normal. No evidence of bowel wall thickening, distention, or inflammatory changes. Vascular/Lymphatic: Mild atherosclerotic change in the common carotid arteries. The aorta and other vessels otherwise normal. No adenopathy. Reproductive: There is a dominant cyst in the right ovary measuring 3.9 cm. The left ovary and uterus are normal. Other: No abdominal wall hernia or abnormality. No abdominopelvic ascites. Musculoskeletal: No acute or significant osseous findings. IMPRESSION: 1. Dominant 3.9 cm cyst in the right ovary. This could potentially cause the patient's symptoms. Pelvic ultrasound could better evaluate if clinically warranted. 2. Mild atherosclerotic change in the common carotid arteries. 3. No other abnormalities. Electronically Signed   By: Dorise Bullion III M.D   On:  01/27/2018 14:48    Procedures Procedures (including critical care time)  Medications Ordered in ED Medications  HYDROmorphone (DILAUDID) injection 1 mg (1 mg Intramuscular Given 01/27/18 1159)  LORazepam (ATIVAN) injection 1 mg (1 mg Intramuscular Given 01/27/18 1159)  dicyclomine (BENTYL) injection 20 mg (20 mg Intramuscular Given 01/27/18 1204)  sodium chloride 0.9 % bolus 1,000 mL (0 mLs Intravenous Stopped 01/27/18 1428)  ketorolac (TORADOL) 15 MG/ML injection 15 mg (15 mg Intravenous Given 01/27/18 1351)  iohexol (OMNIPAQUE) 300 MG/ML solution 100 mL (100 mLs Intravenous Contrast Given 01/27/18 1438)     Initial Impression / Assessment and Plan / ED Course  I have reviewed the triage vital signs and the nursing notes.  Pertinent labs & imaging results that were available during my care of the patient were reviewed by me and considered in my medical decision making (see chart for details).     Pt continues to say she is in a lot of pain and vomiting although I don't think she has actually vomited since being in the ED. W/u fairly unremarkable aside from leukocytosis. She had a leukocytosis with similar symptoms in September w/o an obvious source of her pain. CT today w/R ovarian cyst. Possible cause of symptoms. PRN pain meds. GYN FU.   Final Clinical Impressions(s) / ED Diagnoses   Final diagnoses:  Generalized abdominal pain  Cyst of right ovary    ED Discharge Orders         Ordered    HYDROcodone-acetaminophen (NORCO/VICODIN) 5-325 MG tablet  Every 4 hours PRN     01/27/18 1509           Virgel Manifold, MD 01/30/18 2154

## 2018-02-22 ENCOUNTER — Ambulatory Visit (INDEPENDENT_AMBULATORY_CARE_PROVIDER_SITE_OTHER): Payer: Medicaid Other | Admitting: Orthopedic Surgery

## 2018-03-03 ENCOUNTER — Encounter (INDEPENDENT_AMBULATORY_CARE_PROVIDER_SITE_OTHER): Payer: Self-pay | Admitting: Family Medicine

## 2018-03-03 ENCOUNTER — Ambulatory Visit (INDEPENDENT_AMBULATORY_CARE_PROVIDER_SITE_OTHER): Payer: Medicaid Other | Admitting: Family Medicine

## 2018-03-03 DIAGNOSIS — M25562 Pain in left knee: Secondary | ICD-10-CM

## 2018-03-03 DIAGNOSIS — G8929 Other chronic pain: Secondary | ICD-10-CM | POA: Diagnosis not present

## 2018-03-03 DIAGNOSIS — M25561 Pain in right knee: Secondary | ICD-10-CM | POA: Diagnosis not present

## 2018-03-03 MED ORDER — GLUCOSAMINE SULFATE 1000 MG PO CAPS: 1.0000 | ORAL_CAPSULE | Freq: Two times a day (BID) | ORAL | 3 refills | Status: DC

## 2018-03-03 MED ORDER — NABUMETONE 750 MG PO TABS: 750.0000 mg | ORAL_TABLET | Freq: Two times a day (BID) | ORAL | 6 refills | Status: DC | PRN

## 2018-03-03 MED ORDER — VITAMIN D-3 125 MCG (5000 UT) PO TABS: 1.0000 | ORAL_TABLET | Freq: Every day | ORAL | 3 refills | Status: DC

## 2018-03-03 NOTE — Progress Notes (Signed)
   Office Visit Note   Patient: Andrea Montgomery           Date of Birth: 1975-02-17           MRN: [id number] Visit Date: 03/03/2018 Requested by: Vincente Liberty, MD [address] [place], [place] [address] PCP: Vincente Liberty, MD  Subjective: Chief Complaint  Patient presents with  . Left Knee - Pain  . Right Knee - Pain    HPI: She is here with bilateral knee pain.  Longstanding intermittent problems with her knees.  They have been bothering her more recently.  Anterior pain, popping and clicking.  The right one gives out sometimes but the left one hurts more.              ROS: She is working on smoking cessation, down to 2 cigarettes daily.  She has a history of diabetes and anemia.  Other systems were reviewed and are negative.  Objective: Vital Signs: There were no vitals taken for this visit.  Physical Exam:  Knees: 1+ patellofemoral crepitus bilaterally.  No effusion is either knee.  Diffuse tenderness around the patellofemoral joint and the medial and lateral joint lines.  Ligaments feel stable.  No pain with internal hip rotation.  Imaging: None today.  Assessment & Plan: 1.  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  Could contemplate physical therapy or possibly cortisone injection if symptoms worsen.     Procedures: No procedures performed  No notes on file     PMFS History: Patient Active Problem List   Diagnosis Date Noted  . Trichomonosis 07/05/2017  . Tobacco abuse 07/19/2014  . Knee pain, chronic 03/13/2014  . Abnormal MRI, knee 03/13/2014  . Menorrhagia with irregular cycle 03/13/2014  . Iron deficiency anemia 03/12/2014   Past Medical History:  Diagnosis Date  . Anxiety   . Asthma   . COPD (chronic obstructive pulmonary disease) ([place])   . Diabetes mellitus   . Emphysema of lung ([place])   . Hypertension   . Iron deficiency anemia 03/12/2014    Family History    Problem Relation Age of Onset  . Hypertension Mother   . Heart disease Mother     Past Surgical History:  Procedure Laterality Date  . ceserean section times 2    . DILITATION & CURRETTAGE/HYSTROSCOPY WITH HYDROTHERMAL ABLATION N/A 10/25/2014   Procedure: DILATATION & CURETTAGE/HYSTEROSCOPY WITH HYDROTHERMAL ABLATION;  Surgeon: Shelly Bombard, MD;  Location: [facility];  Service: Gynecology;  Laterality: N/A;  . TUBAL LIGATION    . 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 Not on file  Tobacco Use  . Smoking status: Current Every Day Smoker    Types: Cigarettes  . Smokeless tobacco: Never Used  . Tobacco comment: 2 cigarettes/day  Substance and Sexual Activity  . Alcohol use: No    Alcohol/week: 0.0 standard drinks  . Drug use: No  . Sexual activity: Yes    Partners: Male    Birth control/protection: None, Surgical

## 2018-05-30 ENCOUNTER — Other Ambulatory Visit: Payer: Self-pay | Admitting: Family Medicine

## 2018-05-30 MED ORDER — NABUMETONE 500 MG PO TABS: 500.0000 mg | ORAL_TABLET | Freq: Two times a day (BID) | ORAL | 6 refills | Status: DC | PRN

## 2018-06-15 ENCOUNTER — Telehealth: Payer: Self-pay | Admitting: *Deleted

## 2018-06-15 MED ORDER — CELECOXIB 200 MG PO CAPS: 200.0000 mg | ORAL_CAPSULE | Freq: Two times a day (BID) | ORAL | 5 refills | Status: DC | PRN

## 2018-06-15 NOTE — Telephone Encounter (Signed)
I called and left a message on the voice mail at [facility] that we would need to change this medication, and it will be no later than tomorrow.  Can you change to either Naproxen tablets, Naproxen EC tablets, Meloxicam, Celecoxib or Ibuprofen tablets? To [facility] (not [facility]).

## 2018-06-15 NOTE — Telephone Encounter (Signed)
Rx sent for celebrex.

## 2018-06-15 NOTE — Telephone Encounter (Signed)
Received call from [facility] wanting to know what the status is on PA for Nabumatone that was placed on June 2, and if you are still wanting pt to have it or if you are changing the medication.   Please call back to [phone]

## 2018-06-16 NOTE — Telephone Encounter (Signed)
I also left the Rx on [facility]'s vm to make sure they did receive the changed NSAID (from nabumetone to celecoxib).

## 2018-10-16 IMAGING — CR DG TIBIA/FIBULA 2V*L*
4 series · 4 of 4 positions shown · non-contrast
Comparison: None.

CLINICAL DATA: Pain following fall

EXAM:
LEFT TIBIA AND FIBULA - 2 VIEW

[t tib-fib lat left (1 of 2)]
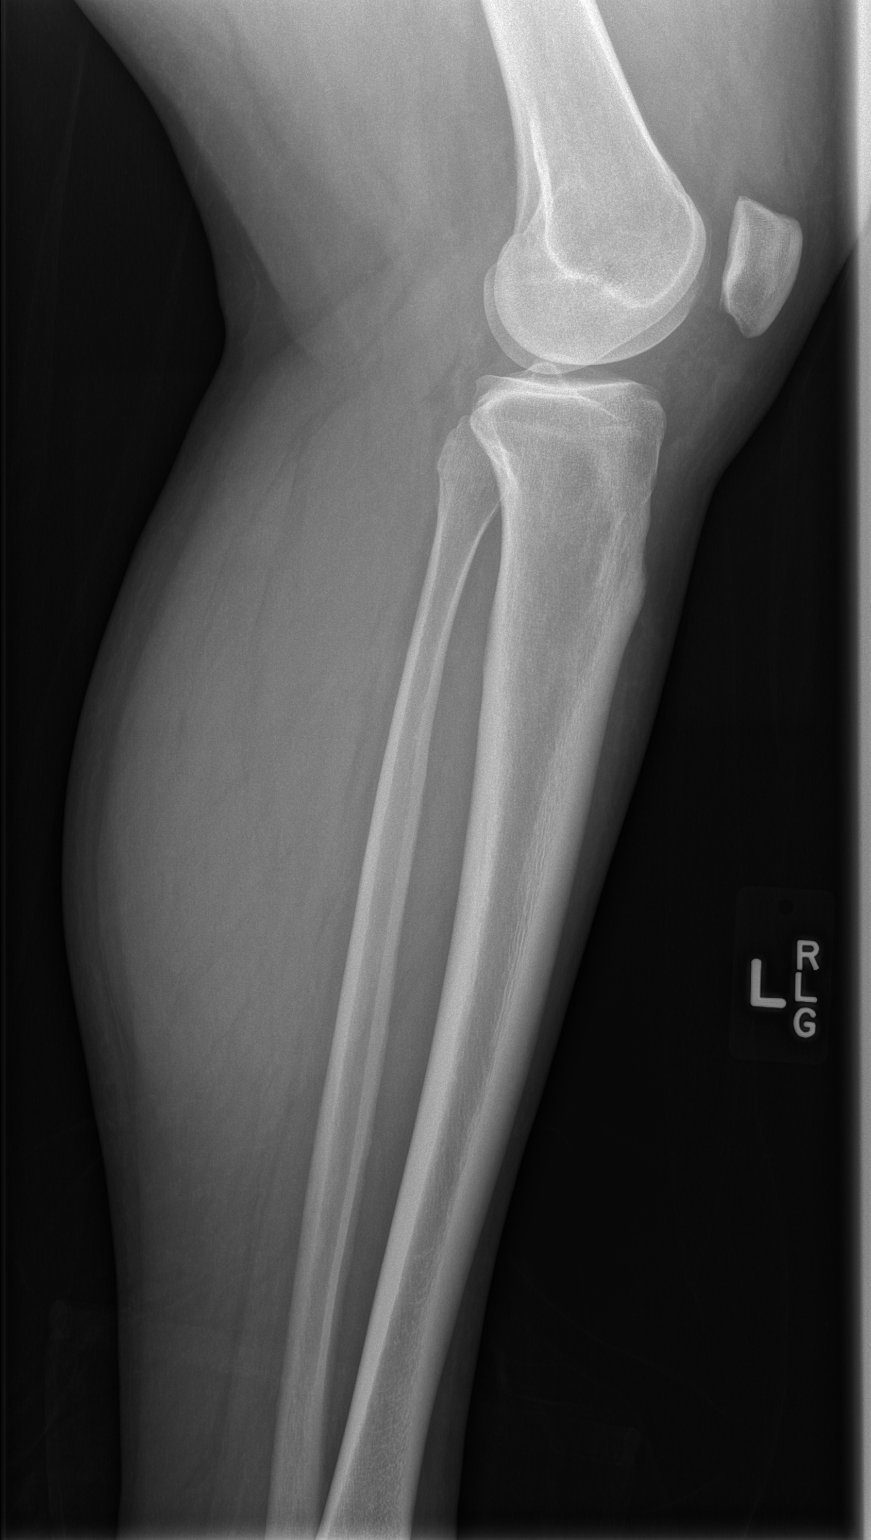

[t tib-fib lat left (2 of 2)]
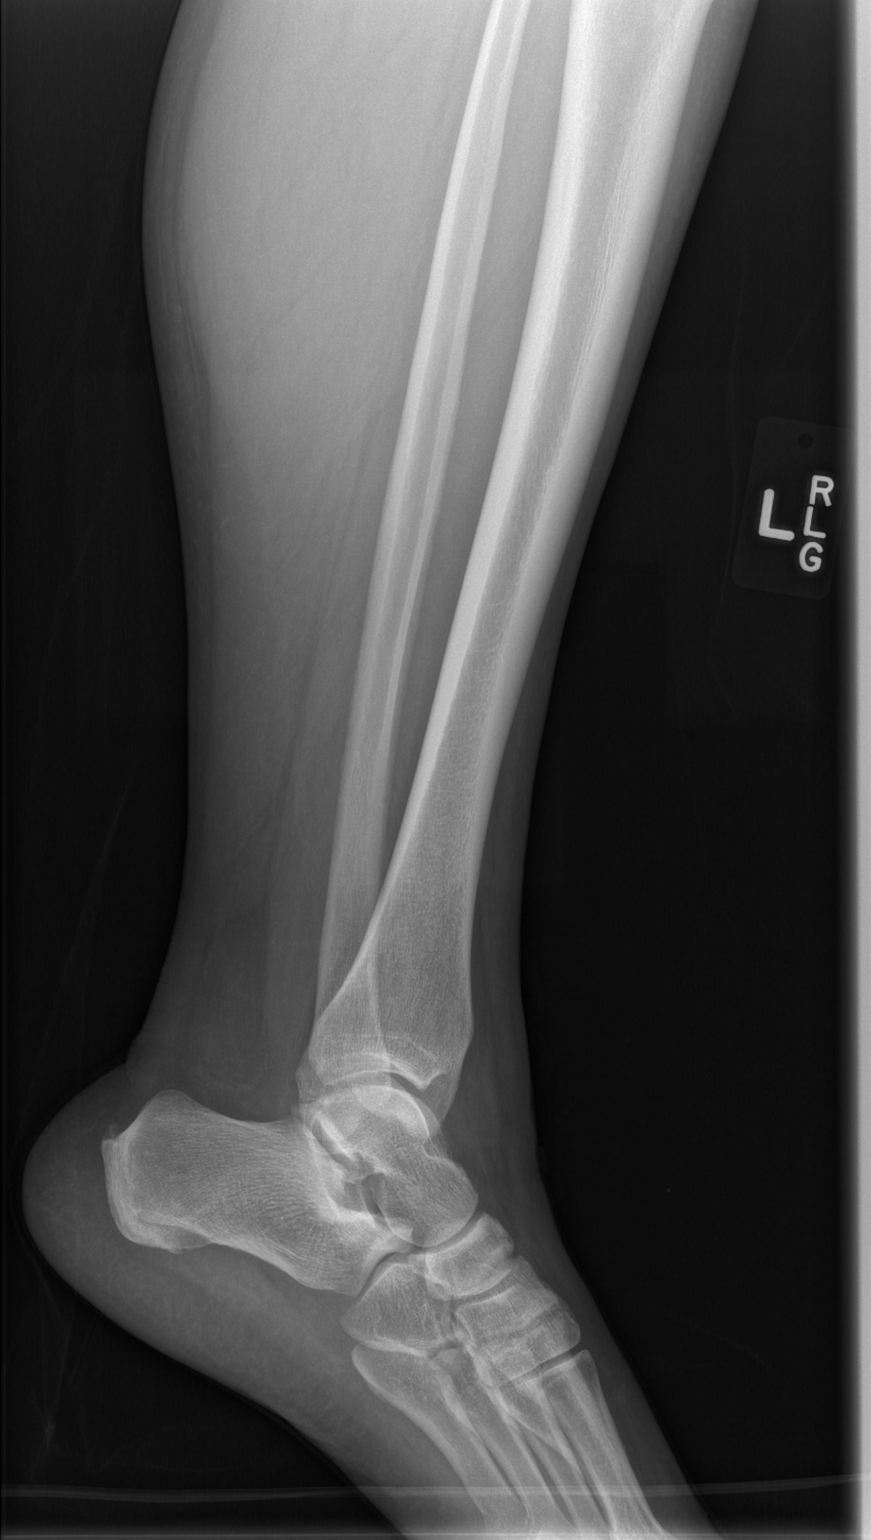

[t tib-fib ap left (1 of 2)]
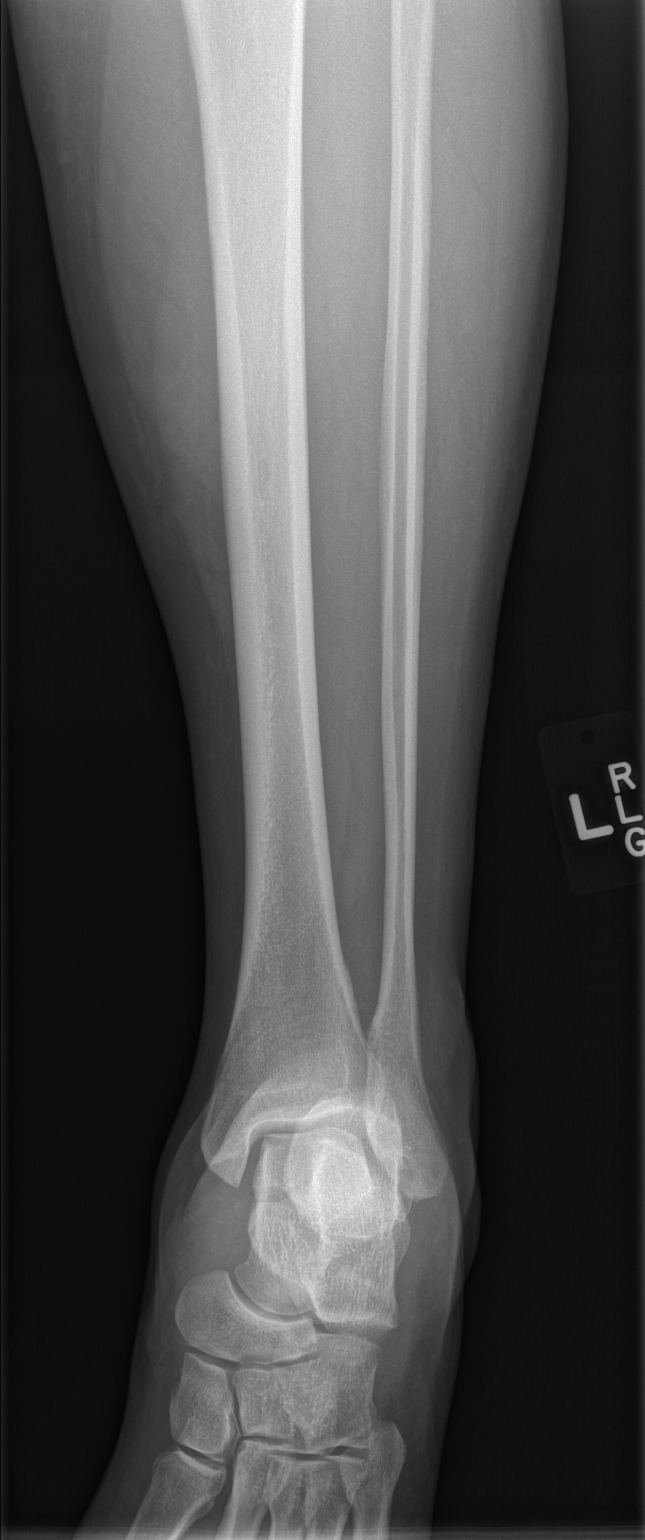

[t tib-fib ap left (2 of 2)]
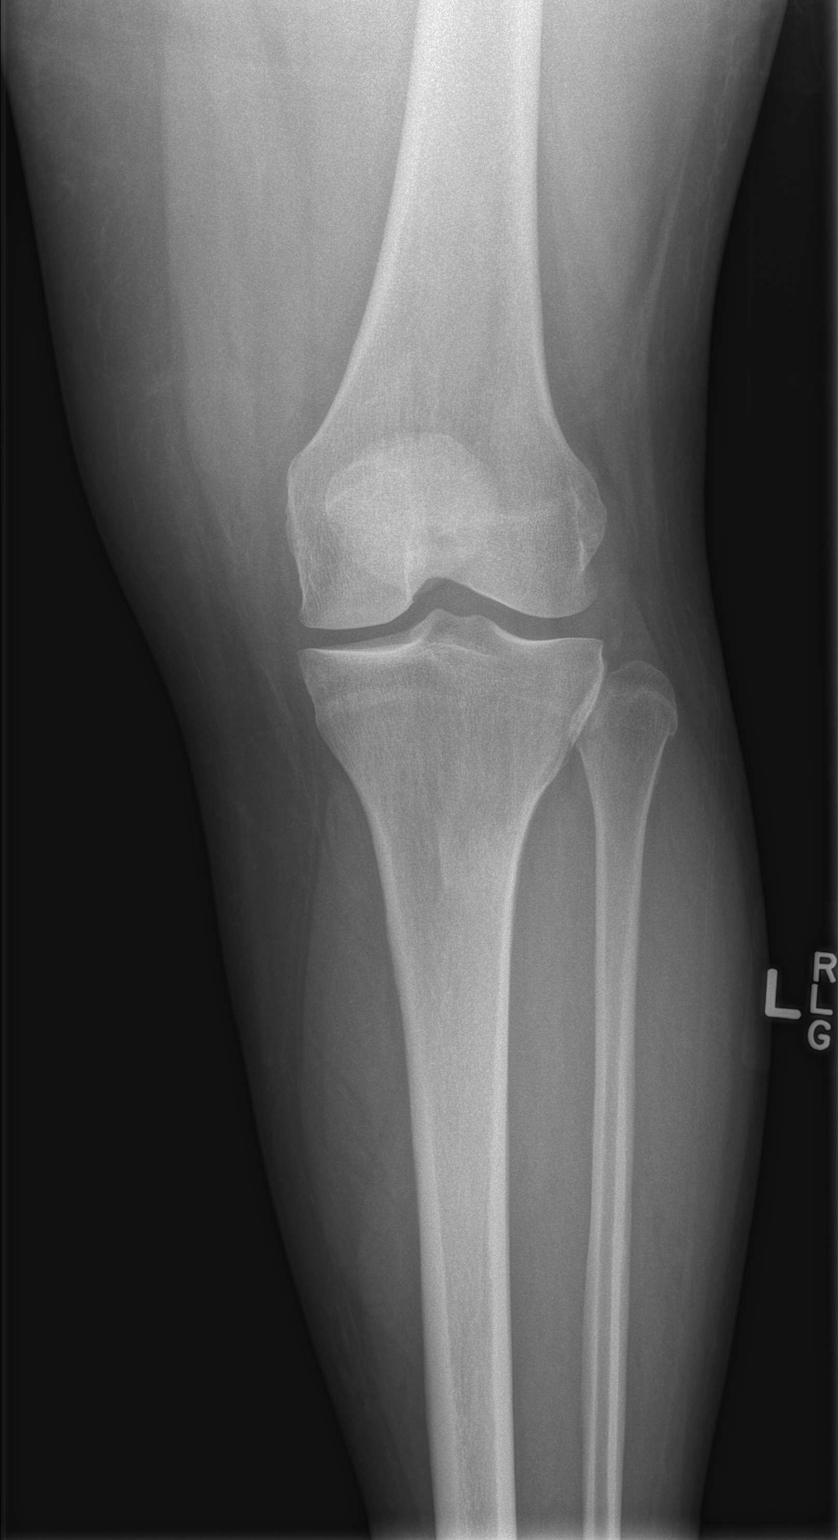

[4 of 4 positions shown; findings below may reference images not displayed]

FINDINGS: Frontal and lateral views were obtained. No fracture or dislocation.
Joint spaces appear normal. No abnormal periosteal reaction. There
are small posterior and inferior calcaneal spurs.
IMPRESSION: No fracture or dislocation. No apparent arthropathic change. There
are small calcaneal spurs.

## 2018-10-16 IMAGING — CR DG KNEE COMPLETE 4+V*L*
4 series · 4 of 4 positions shown · non-contrast
Comparison: Left knee MRI September 11, 2006

CLINICAL DATA: Pain following fall

EXAM:
LEFT KNEE - COMPLETE 4+ VIEW

[t knee lat left]
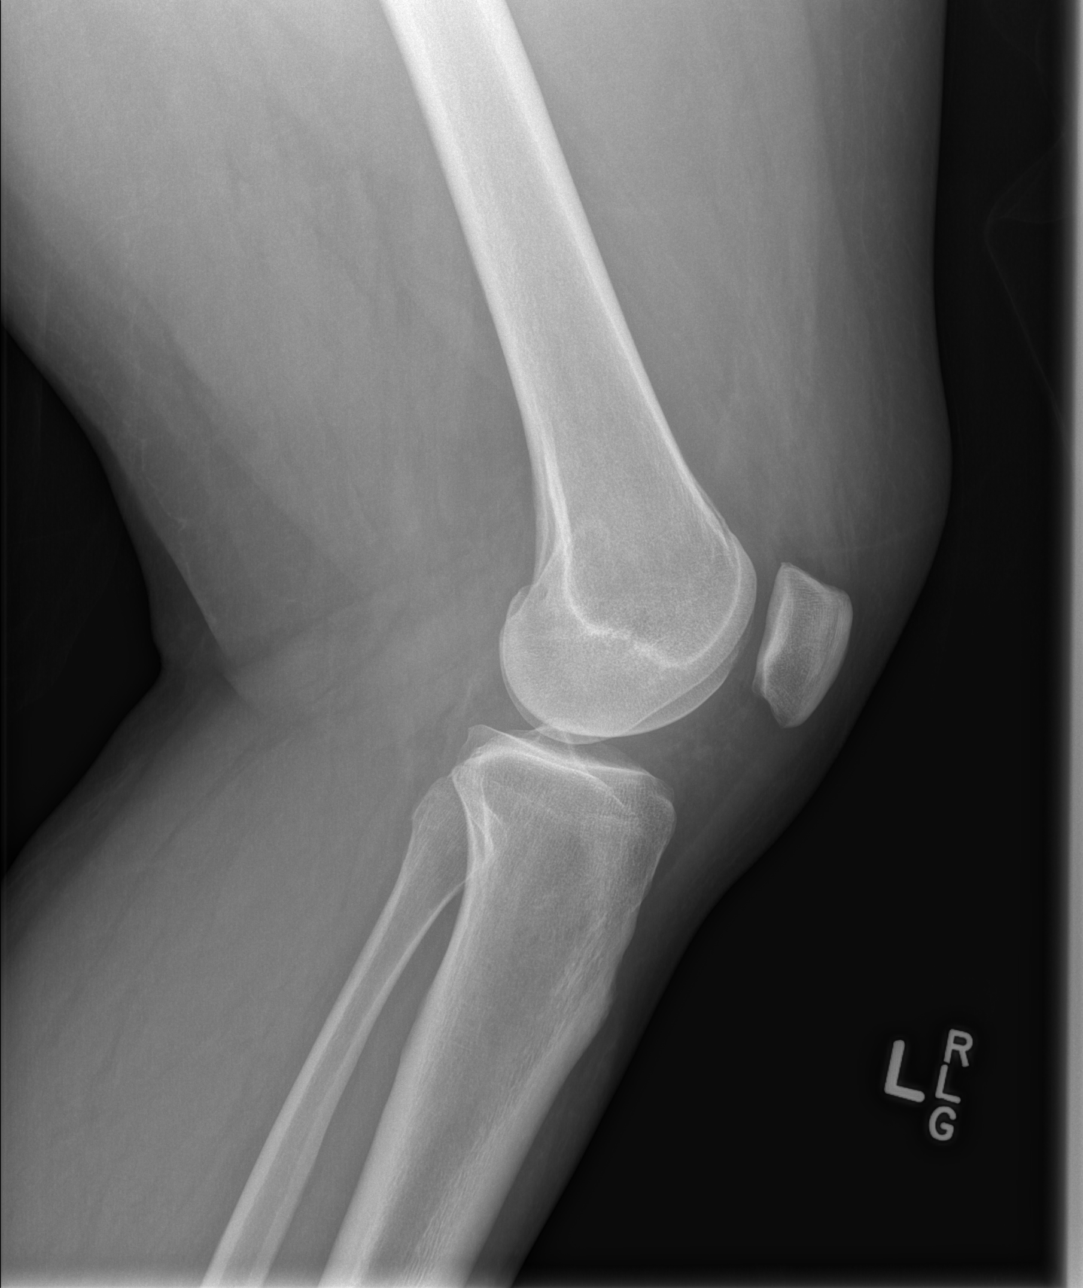

[t knee ap left]
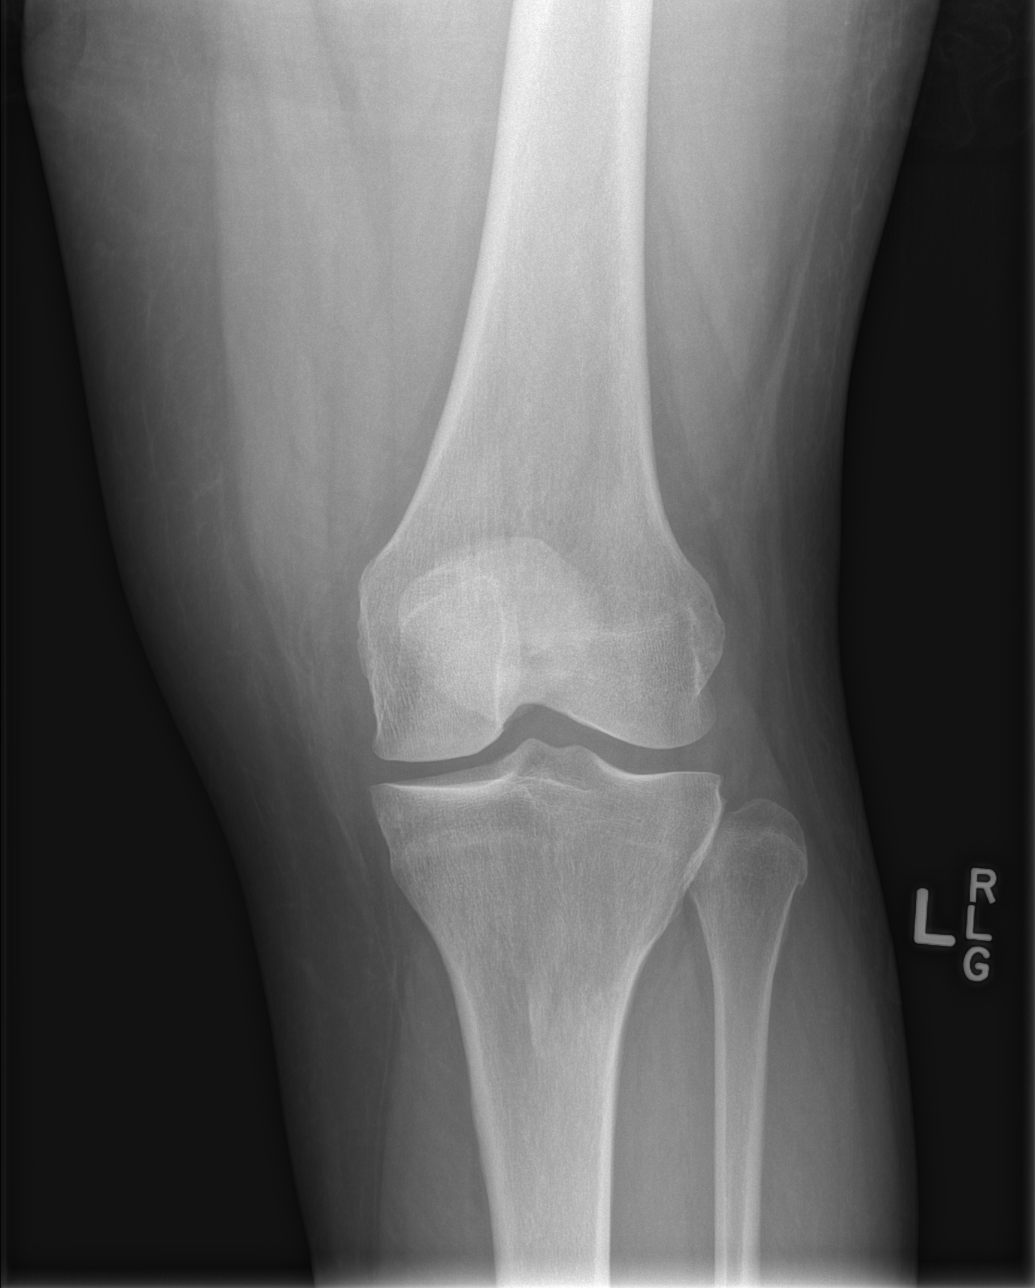

[t knee obl left (1 of 2)]
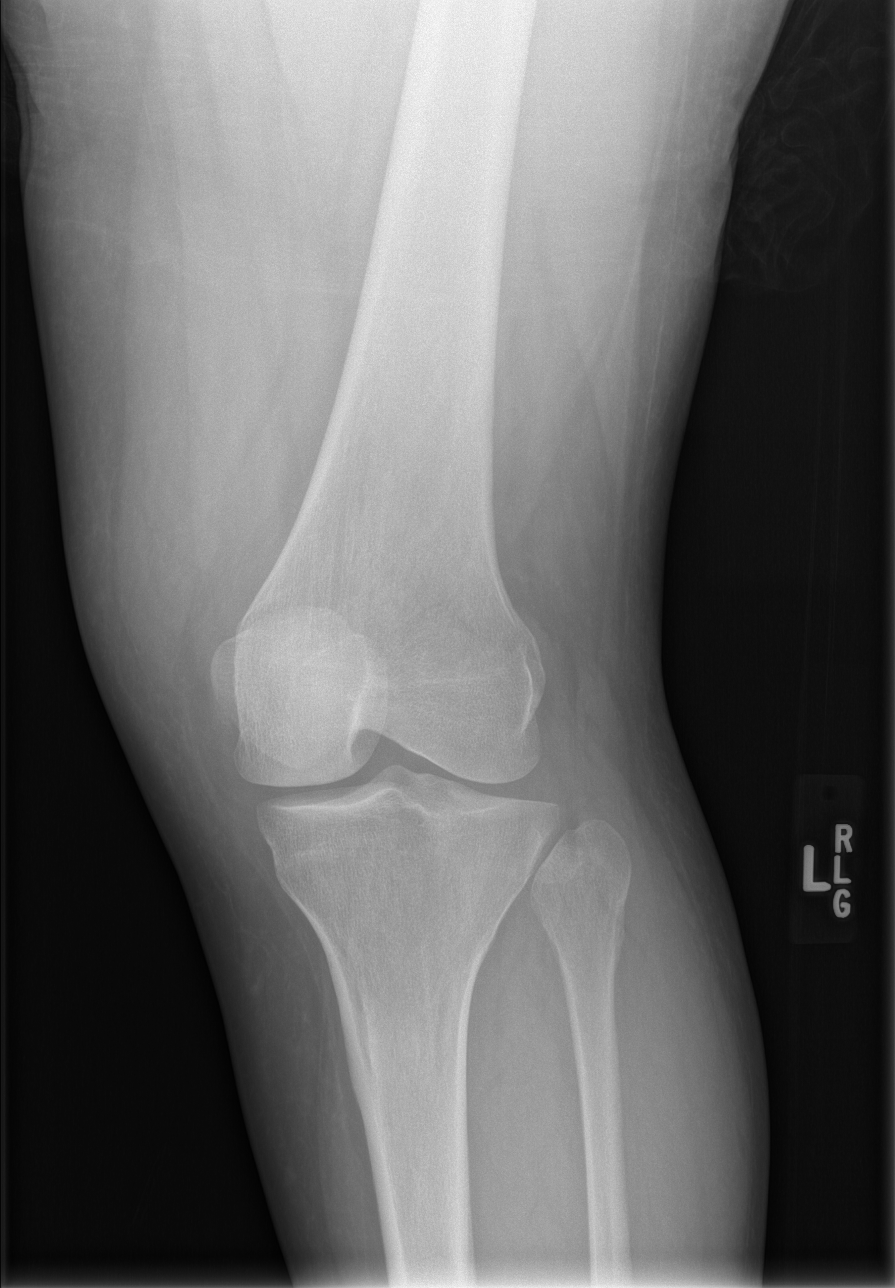

[t knee obl left (2 of 2)]
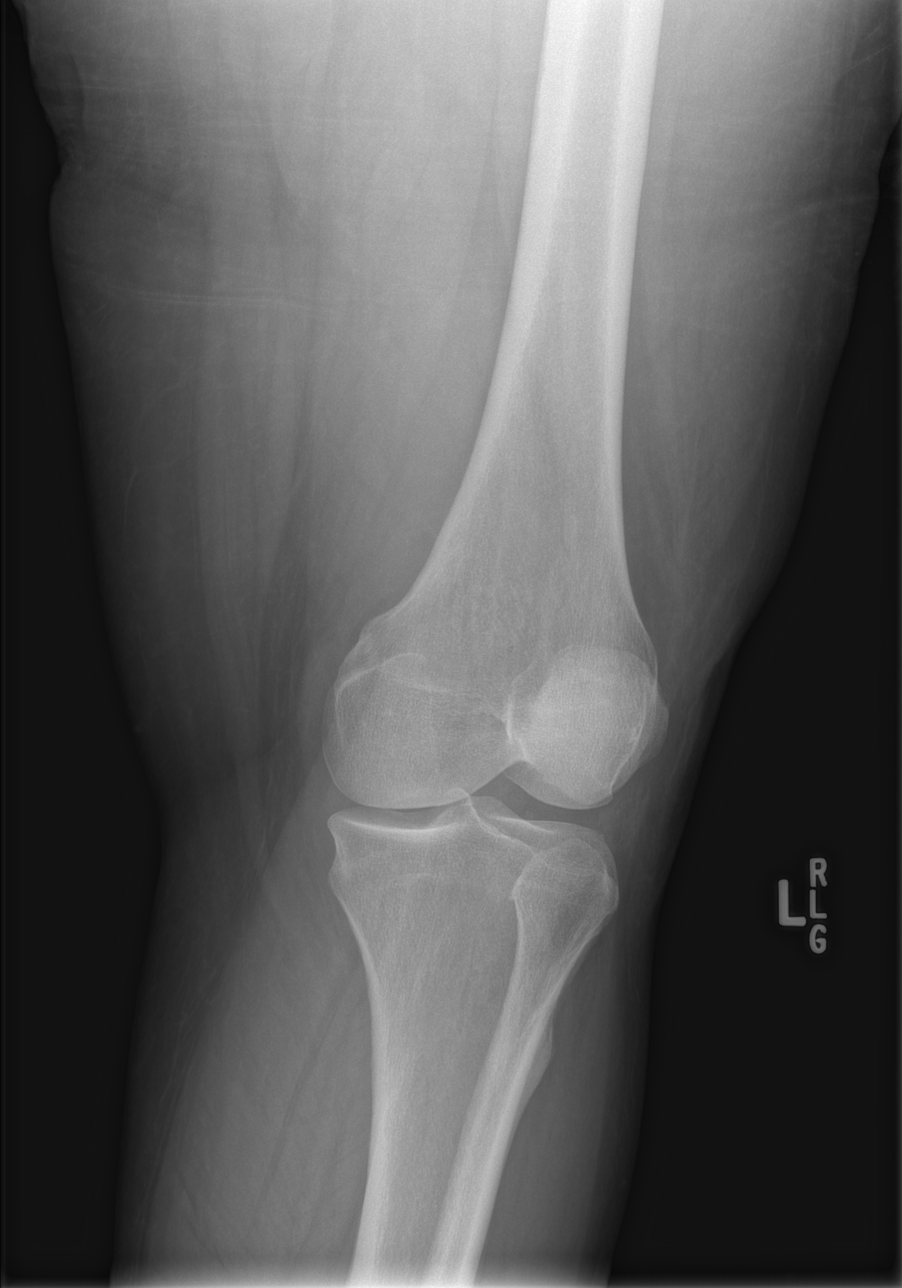

[4 of 4 positions shown; findings below may reference images not displayed]

FINDINGS: Frontal, lateral, and bilateral oblique views were obtained. There
is no fracture or dislocation. No joint effusion. The joint spaces
appear normal. No erosive change.
IMPRESSION: No fracture or dislocation.  No evident arthropathy.

## 2018-10-19 ENCOUNTER — Ambulatory Visit: Payer: Medicaid Other | Admitting: Orthopedic Surgery

## 2018-10-19 ENCOUNTER — Ambulatory Visit (INDEPENDENT_AMBULATORY_CARE_PROVIDER_SITE_OTHER): Payer: Medicaid Other

## 2018-10-19 ENCOUNTER — Other Ambulatory Visit: Payer: Self-pay

## 2018-10-19 ENCOUNTER — Ambulatory Visit: Payer: Self-pay

## 2018-10-19 ENCOUNTER — Ambulatory Visit (INDEPENDENT_AMBULATORY_CARE_PROVIDER_SITE_OTHER): Payer: Medicaid Other | Admitting: Orthopedic Surgery

## 2018-10-19 DIAGNOSIS — M25562 Pain in left knee: Secondary | ICD-10-CM

## 2018-10-19 DIAGNOSIS — M25561 Pain in right knee: Secondary | ICD-10-CM

## 2018-10-19 DIAGNOSIS — M5442 Lumbago with sciatica, left side: Secondary | ICD-10-CM | POA: Diagnosis not present

## 2018-10-19 DIAGNOSIS — G8929 Other chronic pain: Secondary | ICD-10-CM

## 2018-10-20 ENCOUNTER — Encounter: Payer: Self-pay | Admitting: Orthopedic Surgery

## 2018-10-20 NOTE — Progress Notes (Signed)
Office Visit Note   Patient: Andrea Montgomery           Date of Birth: [birth year]           MRN: [id number] Visit Date: 10/19/2018 Requested by: Vincente Liberty, MD [address] [place],  [place] [address] PCP: Vincente Liberty, MD  Subjective: Chief Complaint  Patient presents with  . Left Knee - Pain  . Lower Back - Pain    HPI: Patient presents with multiple orthopedic complaints.  She states her left knee is giving out she also reports low back pain.  Overall her back seems to be getting worse for her.  She ambulates with a cane.  She has tried injections and pain management which have not helped.  She does have a history of torn MCL on that left knee.  Also MRI scan done recently within the past few years show desiccated disc at L4-5 otherwise intact.  She cannot really do a lot of physical therapy because of her insurance restrictions.  She has lost 30 pounds since she was last seen.  She has a history of diabetes as well as sleep apnea.              ROS: All systems reviewed are negative as they relate to the chief complaint within the history of present illness.  Patient denies  fevers or chills.   Assessment & Plan: Visit Diagnoses:  1. Chronic pain of both knees   2. Chronic bilateral low back pain with left-sided sciatica     Plan: Impression is bilateral knee pain with stable collateral cruciate ligaments on exam and no real effusion.  I do not think there is an operative problem in either knee.  Regarding the back I think she has well aligned lumbar spine with one disc that has some desiccation.  Not much collapse in the disc space yet.  Overall I do not think many surgeons would find a surgical indication for she chemo.  She has lost weight but in terms of the things that are within her control really her weight and her means of exercise and fitness level are the things she has been her control that she could try to alternate which may make a difference in all of  this pain.  I do think that all of the pain medication she has been taking does also adversely affect this clinical situation I will see her back as needed  Follow-Up Instructions: No follow-ups on file.   Orders:  Orders Placed This Encounter  Procedures  . XR Lumbar Spine 2-3 Views  . XR Knee 1-2 Views Left  . XR Knee 1-2 Views Right   No orders of the defined types were placed in this encounter.     Procedures: No procedures performed   Clinical Data: No additional findings.  Objective: Vital Signs: There were no vitals taken for this visit.  Physical Exam:   Constitutional: Patient appears well-developed HEENT:  Head: Normocephalic Eyes:EOM are normal Neck: Normal range of motion Cardiovascular: Normal rate Pulmonary/chest: Effort normal Neurologic: Patient is alert Skin: Skin is warm Psychiatric: Patient has normal mood and affect    Ortho Exam: Ortho exam demonstrates normal gait alignment with no nerve root tension signs.  No effusion in either knee.  Collateral crucial ligaments are stable.  Pedal pulses palpable.  No real trochanteric tenderness.  Mild pain with forward lateral bending.  Symmetric reflexes.  Negative clonus.  Specialty Comments:  No specialty comments available.  Imaging: No results found.   PMFS History: Patient Active Problem List   Diagnosis Date Noted  . Trichomonosis 07/05/2017  . Tobacco abuse 07/19/2014  . Knee pain, chronic 03/13/2014  . Abnormal MRI, knee 03/13/2014  . Menorrhagia with irregular cycle 03/13/2014  . Iron deficiency anemia 03/12/2014   Past Medical History:  Diagnosis Date  . Anxiety   . Asthma   . COPD (chronic obstructive pulmonary disease) ([place])   . Diabetes mellitus   . Emphysema of lung ([place])   . Hypertension   . Iron deficiency anemia 03/12/2014    Family History  Problem Relation Age of Onset  . Hypertension Mother   . Heart disease Mother     Past Surgical History:  Procedure Laterality  Date  . ceserean section times 2    . DILITATION & CURRETTAGE/HYSTROSCOPY WITH HYDROTHERMAL ABLATION N/A 10/25/2014   Procedure: DILATATION & CURETTAGE/HYSTEROSCOPY WITH HYDROTHERMAL ABLATION;  Surgeon: Shelly Bombard, MD;  Location: [facility];  Service: Gynecology;  Laterality: N/A;  . TUBAL LIGATION    . 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 Not on file  Tobacco Use  . Smoking status: Current Every Day Smoker    Types: Cigarettes  . Smokeless tobacco: Never Used  . Tobacco comment: 2 cigarettes/day  Substance and Sexual Activity  . Alcohol use: No    Alcohol/week: 0.0 standard drinks  . Drug use: No  . Sexual activity: Yes    Partners: Male    Birth control/protection: None, Surgical

## 2018-10-23 ENCOUNTER — Ambulatory Visit: Payer: Medicaid Other | Admitting: Orthopedic Surgery

## 2019-04-02 ENCOUNTER — Other Ambulatory Visit: Payer: Self-pay | Admitting: Pulmonary Disease

## 2019-04-02 ENCOUNTER — Ambulatory Visit
Admission: RE | Admit: 2019-04-02 | Discharge: 2019-04-02 | Disposition: A | Discharge status: Home / Self Care | Payer: Medicaid Other | Source: Ambulatory Visit | Attending: Pulmonary Disease | Admitting: Pulmonary Disease

## 2019-04-02 DIAGNOSIS — R609 Edema, unspecified: Secondary | ICD-10-CM

## 2019-04-02 DIAGNOSIS — R52 Pain, unspecified: Secondary | ICD-10-CM

## 2019-05-11 ENCOUNTER — Other Ambulatory Visit: Payer: Self-pay | Admitting: Pain Medicine

## 2019-05-11 DIAGNOSIS — M545 Low back pain, unspecified: Secondary | ICD-10-CM

## 2019-06-07 ENCOUNTER — Ambulatory Visit
Admission: RE | Admit: 2019-06-07 | Discharge: 2019-06-07 | Disposition: A | Discharge status: Home / Self Care | Payer: Medicaid Other | Source: Ambulatory Visit | Attending: Pain Medicine | Admitting: Pain Medicine

## 2019-06-07 ENCOUNTER — Other Ambulatory Visit: Payer: Self-pay

## 2019-06-07 DIAGNOSIS — M545 Low back pain, unspecified: Secondary | ICD-10-CM

## 2019-07-04 ENCOUNTER — Ambulatory Visit (INDEPENDENT_AMBULATORY_CARE_PROVIDER_SITE_OTHER): Payer: Medicaid Other | Admitting: Orthopedic Surgery

## 2019-07-04 DIAGNOSIS — M5442 Lumbago with sciatica, left side: Secondary | ICD-10-CM | POA: Diagnosis not present

## 2019-07-04 DIAGNOSIS — G8929 Other chronic pain: Secondary | ICD-10-CM | POA: Diagnosis not present

## 2019-07-07 ENCOUNTER — Encounter: Payer: Self-pay | Admitting: Orthopedic Surgery

## 2019-07-07 NOTE — Progress Notes (Signed)
Office Visit Note   Patient: Andrea Montgomery           Date of Birth: 1975-03-13           MRN: [id number] Visit Date: 07/04/2019 Requested by: Vincente Liberty, MD [address] [place],  [place] [address] PCP: Vincente Liberty, MD  Subjective: Chief Complaint  Patient presents with  . Lower Back - Pain    HPI: She came as a patient with back pain and bilateral leg pain.  Referred here from pain management.  Reports that her knees are giving out right more than the left.  She has had a fall.  Describes radicular pain in both legs along with numbness and tingling.  Takes hydrocodone 4/day.  By MRI scan she has a new right subarticular disc protrusion at L4-5 with some superior migration.  She has had some epidural steroid injections this year but none in the last 3 months.  She does have uncontrolled diabetes and she has had physical therapy.  She states that she needs to see a pulmonary specialist to get her sleep apnea treated.  This has been set up.              ROS: All systems reviewed are negative as they relate to the chief complaint within the history of present illness.  Patient denies  fevers or chills.   Assessment & Plan: Visit Diagnoses:  1. Chronic bilateral low back pain with left-sided sciatica     Plan: Impression is low back pain with bilateral sciatica worse now on the right-hand side than the left.  She does have a new right subarticular disc protrusion with some superior migration by MRI scan.  Do not know that further epidural steroid injections great idea at this time based on her diabetes.  She is going to let us know after she sees her pulmonary specialist and then we will refer her to a spine specialist for operative consideration.  Cannot really get anything done until the sleep apnea business is resolved.  Follow-Up Instructions: Return if symptoms worsen or fail to improve.   Orders:  No orders of the defined types were placed in this  encounter.  No orders of the defined types were placed in this encounter.     Procedures: No procedures performed   Clinical Data: No additional findings.  Objective: Vital Signs: There were no vitals taken for this visit.  Physical Exam:   Constitutional: Patient appears well-developed HEENT:  Head: Normocephalic Eyes:EOM are normal Neck: Normal range of motion Cardiovascular: Normal rate Pulmonary/chest: Effort normal Neurologic: Patient is alert Skin: Skin is warm Psychiatric: Patient has normal mood and affect    Ortho Exam: Ortho exam demonstrates full active and passive range of motion bilateral knees ankles and hips.  No weakness ankle dorsiflexion plantarflexion quad hamstring strength.  Reflexes symmetric bilateral patella and Achilles.  Pedal pulses palpable.  Ankle dorsiflexion 5+ out of 5 bilaterally.  Mild pain with forward and lateral bending.  Specialty Comments:  No specialty comments available.  Imaging: No results found.   PMFS History: Patient Active Problem List   Diagnosis Date Noted  . Trichomonosis 07/05/2017  . Tobacco abuse 07/19/2014  . Knee pain, chronic 03/13/2014  . Abnormal MRI, knee 03/13/2014  . Menorrhagia with irregular cycle 03/13/2014  . Iron deficiency anemia 03/12/2014   Past Medical History:  Diagnosis Date  . Anxiety   . Asthma   . COPD (chronic obstructive pulmonary disease) ([place])   .  Diabetes mellitus   . Emphysema of lung ([place])   . Hypertension   . Iron deficiency anemia 03/12/2014    Family History  Problem Relation Age of Onset  . Hypertension Mother   . Heart disease Mother     Past Surgical History:  Procedure Laterality Date  . ceserean section times 2    . DILITATION & CURRETTAGE/HYSTROSCOPY WITH HYDROTHERMAL ABLATION N/A 10/25/2014   Procedure: DILATATION & CURETTAGE/HYSTEROSCOPY WITH HYDROTHERMAL ABLATION;  Surgeon: Shelly Bombard, MD;  Location: [facility];  Service: Gynecology;  Laterality: N/A;  .  TUBAL LIGATION    . 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 Not on file  Tobacco Use  . Smoking status: Current Every Day Smoker    Types: Cigarettes  . Smokeless tobacco: Never Used  . Tobacco comment: 2 cigarettes/day  Vaping Use  . Vaping Use: Never used  Substance and Sexual Activity  . Alcohol use: No    Alcohol/week: 0.0 standard drinks  . Drug use: No  . Sexual activity: Yes    Partners: Male    Birth control/protection: None, Surgical

## 2020-02-05 ENCOUNTER — Emergency Department (HOSPITAL_COMMUNITY): Payer: Medicaid Other

## 2020-02-05 ENCOUNTER — Emergency Department (HOSPITAL_COMMUNITY)
Admission: EM | Admit: 2020-02-05 | Discharge: 2020-02-06 | Disposition: A | Discharge status: Home / Self Care | Payer: Medicaid Other | Attending: Emergency Medicine | Admitting: Emergency Medicine

## 2020-02-05 ENCOUNTER — Encounter (HOSPITAL_COMMUNITY): Payer: Self-pay

## 2020-02-05 DIAGNOSIS — R1012 Left upper quadrant pain: Secondary | ICD-10-CM | POA: Diagnosis not present

## 2020-02-05 DIAGNOSIS — Z794 Long term (current) use of insulin: Secondary | ICD-10-CM | POA: Diagnosis not present

## 2020-02-05 DIAGNOSIS — I10 Essential (primary) hypertension: Secondary | ICD-10-CM | POA: Insufficient documentation

## 2020-02-05 DIAGNOSIS — E119 Type 2 diabetes mellitus without complications: Secondary | ICD-10-CM | POA: Insufficient documentation

## 2020-02-05 DIAGNOSIS — Z7984 Long term (current) use of oral hypoglycemic drugs: Secondary | ICD-10-CM | POA: Diagnosis not present

## 2020-02-05 DIAGNOSIS — Z7951 Long term (current) use of inhaled steroids: Secondary | ICD-10-CM | POA: Insufficient documentation

## 2020-02-05 DIAGNOSIS — Z79899 Other long term (current) drug therapy: Secondary | ICD-10-CM | POA: Insufficient documentation

## 2020-02-05 DIAGNOSIS — J449 Chronic obstructive pulmonary disease, unspecified: Secondary | ICD-10-CM | POA: Insufficient documentation

## 2020-02-05 DIAGNOSIS — R1032 Left lower quadrant pain: Secondary | ICD-10-CM | POA: Insufficient documentation

## 2020-02-05 DIAGNOSIS — R112 Nausea with vomiting, unspecified: Secondary | ICD-10-CM | POA: Insufficient documentation

## 2020-02-05 DIAGNOSIS — F17210 Nicotine dependence, cigarettes, uncomplicated: Secondary | ICD-10-CM | POA: Insufficient documentation

## 2020-02-05 DIAGNOSIS — J45909 Unspecified asthma, uncomplicated: Secondary | ICD-10-CM | POA: Diagnosis not present

## 2020-02-05 DIAGNOSIS — M5489 Other dorsalgia: Secondary | ICD-10-CM | POA: Insufficient documentation

## 2020-02-05 LAB — COMPREHENSIVE METABOLIC PANEL
ALT: 34 U/L (ref 0–44)
AST: 35 U/L (ref 15–41)
Albumin: 4.7 g/dL (ref 3.5–5.0)
Alkaline Phosphatase: 55 U/L (ref 38–126)
Anion gap: 14 (ref 5–15)
BUN: 19 mg/dL (ref 6–20)
CO2: 23 mmol/L (ref 22–32)
Calcium: 10.2 mg/dL (ref 8.9–10.3)
Chloride: 94 mmol/L — ABNORMAL LOW (ref 98–111)
Creatinine, Ser: 1.23 mg/dL — ABNORMAL HIGH (ref 0.44–1.00)
GFR, Estimated: 56 mL/min — ABNORMAL LOW (ref >60)
Glucose, Bld: 308 mg/dL — ABNORMAL HIGH (ref 70–99)
Potassium: 4.2 mmol/L (ref 3.5–5.1)
Sodium: 131 mmol/L — ABNORMAL LOW (ref 135–145)
Total Bilirubin: 1 mg/dL (ref 0.3–1.2)
Total Protein: 9 g/dL — ABNORMAL HIGH (ref 6.5–8.1)

## 2020-02-05 LAB — I-STAT CHEM 8, ED
BUN: 24 mg/dL — ABNORMAL HIGH (ref 6–20)
Calcium, Ion: 1.1 mmol/L — ABNORMAL LOW (ref 1.15–1.40)
Chloride: 96 mmol/L — ABNORMAL LOW (ref 98–111)
Creatinine, Ser: 1.1 mg/dL — ABNORMAL HIGH (ref 0.44–1.00)
Glucose, Bld: 315 mg/dL — ABNORMAL HIGH (ref 70–99)
HCT: 54 % — ABNORMAL HIGH (ref 36.0–46.0)
Hemoglobin: 18.4 g/dL — ABNORMAL HIGH (ref 12.0–15.0)
Potassium: 4.9 mmol/L (ref 3.5–5.1)
Sodium: 131 mmol/L — ABNORMAL LOW (ref 135–145)
TCO2: 28 mmol/L (ref 22–32)

## 2020-02-05 LAB — TROPONIN I (HIGH SENSITIVITY)
Troponin I (High Sensitivity): 44 ng/L — ABNORMAL HIGH (ref <18)
Troponin I (High Sensitivity): 29 ng/L — ABNORMAL HIGH (ref <18)

## 2020-02-05 LAB — BLOOD GAS, VENOUS
Acid-Base Excess: 0.9 mmol/L (ref 0.0–2.0)
Bicarbonate: 26.5 mmol/L (ref 20.0–28.0)
O2 Saturation: 80.1 %
Patient temperature: 98.6
pCO2, Ven: 47.5 mmHg (ref 44.0–60.0)
pH, Ven: 7.365 (ref 7.250–7.430)
pO2, Ven: 49.1 mmHg — ABNORMAL HIGH (ref 32.0–45.0)

## 2020-02-05 LAB — CBC WITH DIFFERENTIAL/PLATELET
Abs Immature Granulocytes: 0.13 10*3/uL — ABNORMAL HIGH (ref 0.00–0.07)
Basophils Absolute: 0 10*3/uL (ref 0.0–0.1)
Basophils Relative: 0 %
Eosinophils Absolute: 0 10*3/uL (ref 0.0–0.5)
Eosinophils Relative: 0 %
HCT: 49 % — ABNORMAL HIGH (ref 36.0–46.0)
Hemoglobin: 15.5 g/dL — ABNORMAL HIGH (ref 12.0–15.0)
Immature Granulocytes: 1 %
Lymphocytes Relative: 14 %
Lymphs Abs: 2.8 10*3/uL (ref 0.7–4.0)
MCH: 27 pg (ref 26.0–34.0)
MCHC: 31.6 g/dL (ref 30.0–36.0)
MCV: 85.4 fL (ref 80.0–100.0)
Monocytes Absolute: 1.6 10*3/uL — ABNORMAL HIGH (ref 0.1–1.0)
Monocytes Relative: 8 %
Neutro Abs: 15.9 10*3/uL — ABNORMAL HIGH (ref 1.7–7.7)
Neutrophils Relative %: 77 %
Platelets: 454 10*3/uL — ABNORMAL HIGH (ref 150–400)
RBC: 5.74 MIL/uL — ABNORMAL HIGH (ref 3.87–5.11)
RDW: 14.4 % (ref 11.5–15.5)
WBC: 20.6 10*3/uL — ABNORMAL HIGH (ref 4.0–10.5)
nRBC: 0 % (ref 0.0–0.2)

## 2020-02-05 LAB — CBG MONITORING, ED: Glucose-Capillary: 282 mg/dL — ABNORMAL HIGH (ref 70–99)

## 2020-02-05 LAB — LIPASE, BLOOD: Lipase: 21 U/L (ref 11–51)

## 2020-02-05 LAB — HCG, QUANTITATIVE, PREGNANCY: hCG, Beta Chain, Quant, S: <1 m[IU]/mL (ref <5)

## 2020-02-05 LAB — BETA-HYDROXYBUTYRIC ACID: Beta-Hydroxybutyric Acid: 0.95 mmol/L — ABNORMAL HIGH (ref 0.05–0.27)

## 2020-02-05 MED ORDER — MORPHINE SULFATE (PF) 4 MG/ML IV SOLN
4.0000 mg | Freq: Once | INTRAVENOUS | Status: AC
  Administered 2020-02-05: 4 mg via INTRAVENOUS
  Filled 2020-02-05: qty 1

## 2020-02-05 MED ORDER — IOHEXOL 350 MG/ML SOLN
100.0000 mL | Freq: Once | INTRAVENOUS | Status: AC | PRN
  Administered 2020-02-05: 100 mL via INTRAVENOUS

## 2020-02-05 MED ORDER — SODIUM CHLORIDE 0.9 % IV BOLUS
1000.0000 mL | Freq: Once | INTRAVENOUS | Status: AC
  Administered 2020-02-05: 1000 mL via INTRAVENOUS

## 2020-02-05 MED ORDER — ONDANSETRON HCL 4 MG/2ML IJ SOLN
4.0000 mg | Freq: Once | INTRAMUSCULAR | Status: AC
  Administered 2020-02-05: 4 mg via INTRAVENOUS
  Filled 2020-02-05: qty 2

## 2020-02-05 NOTE — ED Notes (Signed)
Patient moved from wheelchair to the the stretcher in the room without assistance. While NT was taking vital signs patient stood up and then laid herself on the floor. Patient told NT that her abdomen was "cramping" and she could not get off the floor. This RN informed patient that she needed to get off the floor and into the chair so the doctor can come assess her. Patient stated that she is in so much pain that she cannot move. This RN encouraged patient to get off the floor and into the chair. The patient slowly stood up and got back in the chair. Patient is now yelling and moaning in pain.

## 2020-02-05 NOTE — ED Provider Notes (Signed)
[facility]-EMERGENCY DEPT Provider Note   CSN: [id number] Arrival date & time: 02/05/20  1734     History Chief Complaint  Patient presents with  . Back Pain    Andrea Montgomery is a 45 y.o. female.  HPI      44yo female with history of COPD, DM, hypertension, anemia, asthma, presents with concern for abdominal pain, nausea and vomiting.    Reports the day before yesterday she had some diarrhea and beginning yesterday she developed severe abdominal pain, nausea and vomiting.  Reports the abominal pain is left upper and left lower quadrant primarily with radiation to the back and rest of the abdomen, sharp, worse with movements.  She also describes squeezing chest pain and dyspnea on ROS. Denies etoh use, drug use including no marijuana. No known sick contacts. Denies known fevers, cough, dysuria, vaginal bleeding or discharge. Denies stopping any medications recently. Pain severe 10/10.   Past Medical History:  Diagnosis Date  . Anxiety   . Asthma   . COPD (chronic obstructive pulmonary disease) (HCC)   . Diabetes mellitus   . Emphysema of lung (HCC)   . Hypertension   . Iron deficiency anemia 03/12/2014    Patient Active Problem List   Diagnosis Date Noted  . Trichomonosis 07/05/2017  . Tobacco abuse 07/19/2014  . Knee pain, chronic 03/13/2014  . Abnormal MRI, knee 03/13/2014  . Menorrhagia with irregular cycle 03/13/2014  . Iron deficiency anemia 03/12/2014    Past Surgical History:  Procedure Laterality Date  . ceserean section times 2    . DILITATION & CURRETTAGE/HYSTROSCOPY WITH HYDROTHERMAL ABLATION N/A 10/25/2014   Procedure: DILATATION & CURETTAGE/HYSTEROSCOPY WITH HYDROTHERMAL ABLATION;  Surgeon: Brock Bad, MD;  Location: [facility];  Service: Gynecology;  Laterality: N/A;  . TUBAL LIGATION    . tumor removed from leg       OB History   No obstetric history on file.     Family History  Problem Relation Age of Onset  .  Hypertension Mother   . Heart disease Mother     Social History   Tobacco Use  . Smoking status: Current Every Day Smoker    Types: Cigarettes  . Smokeless tobacco: Never Used  . Tobacco comment: 2 cigarettes/day  Vaping Use  . Vaping Use: Never used  Substance Use Topics  . Alcohol use: No    Alcohol/week: 0.0 standard drinks  . Drug use: No    Home Medications Prior to Admission medications   Medication Sig Start Date End Date Taking? Authorizing Provider  ondansetron (ZOFRAN ODT) 4 MG disintegrating tablet Take 1 tablet (4 mg total) by mouth every 8 (eight) hours as needed for up to 3 days for nausea or vomiting. 02/06/20 02/09/20 Yes Cardama, Amadeo Garnet, MD  albuterol (PROVENTIL HFA;VENTOLIN HFA) 108 (90 BASE) MCG/ACT inhaler Inhale 2 puffs into the lungs every 6 (six) hours as needed for wheezing or shortness of breath.     [provider]  ALPRAZolam Prudy Feeler) 1 MG tablet Take 1 mg by mouth 4 (four) times daily.    [provider]  celecoxib (CELEBREX) 200 MG capsule Take 1 capsule (200 mg total) by mouth 2 (two) times daily as needed. 06/15/18   Hilts, Casimiro Needle, MD  Cholecalciferol (VITAMIN D-3) 125 MCG (5000 UT) TABS Take 1 tablet by mouth daily. 03/03/18   Hilts, Casimiro Needle, MD  Fluticasone-Salmeterol (ADVAIR DISKUS) 250-50 MCG/DOSE AEPB Inhale 1 puff into the lungs every 12 (twelve) hours. For  shortness of breath    [provider]  furosemide (LASIX) 40 MG tablet Take 40 mg by mouth daily.    [provider]  Glucosamine Sulfate 1000 MG CAPS Take 1 capsule (1,000 mg total) by mouth 2 (two) times daily. 03/03/18   Hilts, Legrand Como, MD  HYDROcodone-acetaminophen (NORCO) 10-325 MG tablet Take 1 tablet by mouth as needed. 08/25/17   [provider]  HYDROcodone-acetaminophen (NORCO/VICODIN) 5-325 MG tablet Take 1 tablet by mouth every 4 (four) hours as needed. Patient not taking: Reported on 03/03/2018 01/27/18   Virgel Manifold, MD  ibuprofen  (ADVIL,MOTRIN) 800 MG tablet Take 1 tablet (800 mg total) by mouth 3 (three) times daily. 04/29/17   Shelly Bombard, MD  insulin aspart protamine-insulin aspart (NOVOLOG 70/30) (70-30) 100 UNIT/ML injection Inject 25 Units into the skin 2 (two) times daily with a meal.     [provider]  insulin glargine (LANTUS) 100 UNIT/ML injection Inject 15 Units into the skin at bedtime.    [provider]  losartan (COZAAR) 50 MG tablet Take 50 mg by mouth daily.    [provider]  metFORMIN (GLUCOPHAGE) 1000 MG tablet Take 1,000 mg by mouth 2 (two) times daily with a meal.    [provider]  nabumetone (RELAFEN) 500 MG tablet Take 1 tablet (500 mg total) by mouth 2 (two) times daily as needed. 05/30/18   Hilts, Legrand Como, MD  polyethylene glycol [facility]) packet Take 17 g by mouth daily. 09/23/17   Fransico Meadow, PA-C    Allergies    Patient has no known allergies.  Review of Systems   Review of Systems  Constitutional: Positive for appetite change and fatigue. Negative for fever.  HENT: Negative for sore throat.   Eyes: Negative for visual disturbance.  Respiratory: Positive for shortness of breath. Negative for cough.   Cardiovascular: Positive for chest pain.  Gastrointestinal: Positive for abdominal pain, diarrhea (2 days ago, not continuing), nausea and vomiting. Negative for constipation.  Genitourinary: Negative for difficulty urinating, dysuria, vaginal bleeding and vaginal discharge.  Musculoskeletal: Positive for back pain. Negative for neck pain.  Skin: Negative for rash.  Neurological: Negative for syncope, weakness, numbness and headaches.    Physical Exam Updated Vital Signs BP (!) 159/77   Pulse (!) 117   Temp 98.6 F (37 C) (Oral)   Resp (!) 23   SpO2 96%   Physical Exam Vitals and nursing note reviewed.  Constitutional:      General: She is in acute distress (pain).     Appearance: She is well-developed and well-nourished. She is  not diaphoretic.  HENT:     Head: Normocephalic and atraumatic.  Eyes:     Extraocular Movements: EOM normal.     Conjunctiva/sclera: Conjunctivae normal.  Cardiovascular:     Rate and Rhythm: Regular rhythm. Tachycardia present.     Pulses: Intact distal pulses.     Heart sounds: Normal heart sounds. No murmur heard. No friction rub. No gallop.   Pulmonary:     Effort: Pulmonary effort is normal. No respiratory distress.     Breath sounds: Normal breath sounds. No wheezing or rales.  Abdominal:     General: There is no distension.     Palpations: Abdomen is soft.     Tenderness: There is abdominal tenderness (diffuse, worse LUQ, left flank, LLQ). There is no guarding.  Musculoskeletal:        General: No tenderness or edema.     Cervical  back: Normal range of motion.  Skin:    General: Skin is warm and dry.     Findings: No erythema or rash.  Neurological:     Mental Status: She is alert and oriented to person, place, and time.     ED Results / Procedures / Treatments   Labs (all labs ordered are listed, but only abnormal results are displayed) Labs Reviewed  CBC WITH DIFFERENTIAL/PLATELET - Abnormal; Notable for the following components:      Result Value   WBC 20.6 (*)    RBC 5.74 (*)    Hemoglobin 15.5 (*)    HCT 49.0 (*)    Platelets 454 (*)    Neutro Abs 15.9 (*)    Monocytes Absolute 1.6 (*)    Abs Immature Granulocytes 0.13 (*)    All other components within normal limits  COMPREHENSIVE METABOLIC PANEL - Abnormal; Notable for the following components:   Sodium 131 (*)    Chloride 94 (*)    Glucose, Bld 308 (*)    Creatinine, Ser 1.23 (*)    Total Protein 9.0 (*)    GFR, Estimated 56 (*)    All other components within normal limits  URINALYSIS, ROUTINE W REFLEX MICROSCOPIC - Abnormal; Notable for the following components:   APPearance HAZY (*)    Specific Gravity, Urine 1.043 (*)    Glucose, UA 150 (*)    Hgb urine dipstick LARGE (*)    Ketones, ur 20  (*)    Protein, ur >=300 (*)    All other components within normal limits  BLOOD GAS, VENOUS - Abnormal; Notable for the following components:   pO2, Ven 49.1 (*)    All other components within normal limits  BETA-HYDROXYBUTYRIC ACID - Abnormal; Notable for the following components:   Beta-Hydroxybutyric Acid 0.95 (*)    All other components within normal limits  RAPID URINE DRUG SCREEN, HOSP PERFORMED - Abnormal; Notable for the following components:   Opiates POSITIVE (*)    Benzodiazepines POSITIVE (*)    Tetrahydrocannabinol POSITIVE (*)    All other components within normal limits  I-STAT CHEM 8, ED - Abnormal; Notable for the following components:   Sodium 131 (*)    Chloride 96 (*)    BUN 24 (*)    Creatinine, Ser 1.10 (*)    Glucose, Bld 315 (*)    Calcium, Ion 1.10 (*)    Hemoglobin 18.4 (*)    HCT 54.0 (*)    All other components within normal limits  CBG MONITORING, ED - Abnormal; Notable for the following components:   Glucose-Capillary 282 (*)    All other components within normal limits  TROPONIN I (HIGH SENSITIVITY) - Abnormal; Notable for the following components:   Troponin I (High Sensitivity) 44 (*)    All other components within normal limits  TROPONIN I (HIGH SENSITIVITY) - Abnormal; Notable for the following components:   Troponin I (High Sensitivity) 29 (*)    All other components within normal limits  URINE CULTURE  LIPASE, BLOOD  HCG, QUANTITATIVE, PREGNANCY    EKG EKG Interpretation  Date/Time:  Tuesday February 05 2020 20:04:26 EST Ventricular Rate:  106 PR Interval:    QRS Duration: 85 QT Interval:  326 QTC Calculation: 433 R Axis:   74 Text Interpretation: Sinus tachycardia Atrial premature complexes Biatrial enlargement 12 Lead; Mason-Likar Since prior ECG, rate has increased Confirmed by Gareth Morgan [phone]) on 02/06/2020 12:28:55 AM   Radiology CT Angio Chest PE W  and/or Wo Contrast  Result Date: 02/05/2020 CLINICAL DATA:  PE  suspected, high prob Diffuse pain. EXAM: CT ANGIOGRAPHY CHEST WITH CONTRAST TECHNIQUE: Multidetector CT imaging of the chest was performed using the standard protocol during bolus administration of intravenous contrast. Multiplanar CT image reconstructions and MIPs were obtained to evaluate the vascular anatomy. CONTRAST:  140mL OMNIPAQUE IOHEXOL 350 MG/ML SOLN COMPARISON:  No recent chest imaging. Chest radiograph 05/12/2011 reviewed. FINDINGS: Cardiovascular: There are no filling defects within the pulmonary arteries to suggest pulmonary embolus. Normal caliber thoracic aorta without dissection or acute aortic findings. Conventional branching pattern from the aortic arch. Coronary artery calcifications. Upper normal heart size. No pericardial effusion. Mediastinum/Nodes: No enlarged mediastinal or hilar lymph nodes. No suspicious thyroid nodule. Decompressed esophagus. Lungs/Pleura: Subsegmental linear opacities in both lower lobes, lingula and right middle lobe consistent with atelectasis. No confluent consolidation. No ground-glass opacity. No septal thickening or pulmonary edema. No pleural fluid. Trachea and central bronchi are patent. No pulmonary mass. Upper Abdomen: Assessed on concurrent abdominal CT. Musculoskeletal: There are no acute or suspicious osseous abnormalities. Review of the MIP images confirms the above findings. IMPRESSION: 1. No pulmonary embolus. 2. Multifocal subsegmental atelectasis. 3. Coronary artery calcifications, advanced for age. Electronically Signed   By: Keith Rake M.D.   On: 02/05/2020 22:55   CT ABDOMEN PELVIS W CONTRAST  Result Date: 02/05/2020 CLINICAL DATA:  Abdominal pain.  Diffuse pain. EXAM: CT ABDOMEN AND PELVIS WITH CONTRAST TECHNIQUE: Multidetector CT imaging of the abdomen and pelvis was performed using the standard protocol following bolus administration of intravenous contrast. Delayed images associated with concurrent chest CTA. CONTRAST:  136mL OMNIPAQUE  IOHEXOL 350 MG/ML SOLN COMPARISON:  Abdominopelvic CT 01/27/2018 FINDINGS: Lower chest: Assessed fully on concurrent chest CTA, reported separately. Hepatobiliary: Diffusely decreased hepatic density consistent with steatosis. Liver is enlarged spanning 18.5 cm cranial caudal. No focal hepatic abnormality. Gallbladder physiologically distended, no calcified stone. No biliary dilatation. Pancreas: No ductal dilatation or inflammation. Spleen: Normal in size without focal abnormality. Adrenals/Urinary Tract: Normal adrenal glands. No hydronephrosis or perinephric edema. Homogeneous renal enhancement with symmetric excretion on delayed phase imaging. Urinary bladder is physiologically distended without wall thickening. Stomach/Bowel: Stomach partially distended. Normal in positioning of the duodenum and ligament of Treitz. No small bowel obstruction, wall thickening or inflammation. Appendix tentatively visualized, series 2, image 61. Regardless, no appendicitis. Small volume of colonic stool. No colonic wall thickening. Vascular/Lymphatic: Bi-iliac atherosclerosis. Normal caliber abdominal aorta. No aneurysm. Patent portal vein. Circumaortic left renal vein. No adenopathy. Reproductive: Uterus and bilateral adnexa are unremarkable. Ovaries are quiescent. Other: No free air, free fluid, or intra-abdominal fluid collection. Tiny fat containing umbilical hernia. Musculoskeletal: There are no acute or suspicious osseous abnormalities. IMPRESSION: 1. No acute abnormality in the abdomen/pelvis. 2. Hepatomegaly and hepatic steatosis. 3. Tiny fat containing umbilical hernia. 4. Bi-iliac atherosclerosis is age advanced. Electronically Signed   By: Keith Rake M.D.   On: 02/05/2020 22:49    Procedures Procedures   Medications Ordered in ED Medications  sodium chloride 0.9 % bolus 1,000 mL (0 mLs Intravenous Stopped 02/05/20 2207)  ondansetron (ZOFRAN) injection 4 mg (4 mg Intravenous Given 02/05/20 2025)  morphine 4  MG/ML injection 4 mg (4 mg Intravenous Given 02/05/20 2025)  morphine 4 MG/ML injection 4 mg (4 mg Intravenous Given 02/05/20 2208)  iohexol (OMNIPAQUE) 350 MG/ML injection 100 mL (100 mLs Intravenous Contrast Given 02/05/20 2220)  metoCLOPramide (REGLAN) injection 10 mg (10 mg Intravenous Given 02/06/20 0043)  hyoscyamine (LEVSIN  SL) SL tablet 0.25 mg (0.25 mg Sublingual Given 02/06/20 0043)  alum & mag hydroxide-simeth (MAALOX/MYLANTA) 200-200-20 MG/5ML suspension 30 mL (30 mLs Oral Given 02/06/20 0043)    And  lidocaine (XYLOCAINE) 2 % viscous mouth solution 15 mL (15 mLs Oral Given 02/06/20 0043)  sodium chloride 0.9 % bolus 1,000 mL (0 mLs Intravenous Stopped 02/06/20 0226)    ED Course  I have reviewed the triage vital signs and the nursing notes.  Pertinent labs & imaging results that were available during my care of the patient were reviewed by me and considered in my medical decision making (see chart for details).    MDM Rules/Calculators/A&P                          45yo female with history of COPD, DM, hypertension, anemia, asthma, presents with concern for abdominal pain, nausea and vomiting.  Also reports chest pain and dyspnea on ROS.    DDx includes viral etiology such as COVID 26, ACS, PE, dissection, SBO, appendicitis, diverticulitis, cholecystitis, pyelonephritis, nephrolithiasis. Lower suspicion for torsion/dissection/nephrolithiasis given noncolicky pain and pain worse with movements, and low suspicion for pelvic etiology given pain not primarily pelvic.  Has normal pulses in upper and lower extremities.  Troponin elevated and stable/decreased on recheck, suspect elevation secondary to stress in setting of tachycardia with HR 150s on arrival.  Obtained PE study given elevated troponin, dyspnea, pain, tachycardia which showed no evidence of PE or other abnormalities. CT abdomen pelvis does not show acute abnormalities.  No sign of DKA.  UA pending to evaluate for signs of pyelonephritis  as etiology of symptoms.  Consider also gastroparesis or viral etiology. Given additional fluids and symptom control.  Signed out with these pending.     Final Clinical Impression(s) / ED Diagnoses Final diagnoses:  Nausea and vomiting, intractability of vomiting not specified, unspecified vomiting type  Other back pain, unspecified chronicity    Rx / DC Orders ED Discharge Orders         Ordered    ondansetron (ZOFRAN ODT) 4 MG disintegrating tablet  Every 8 hours PRN        02/06/20 0223           Gareth Morgan, MD 02/06/20 1204

## 2020-02-05 NOTE — ED Triage Notes (Signed)
Per EMS-states patient complaining of pain all over-unable to keep pain meds down due to vomiting...states she has "bone cancer and has chemo in 2 weeks"-100 mcg of Fentanyl and 4 mg of Zofran given in route-

## 2020-02-06 LAB — URINALYSIS, ROUTINE W REFLEX MICROSCOPIC
Bacteria, UA: NONE SEEN
Bilirubin Urine: NEGATIVE
Glucose, UA: 150 mg/dL — AB
Ketones, ur: 20 mg/dL — AB
Leukocytes,Ua: NEGATIVE
Nitrite: NEGATIVE
Protein, ur: >=300 mg/dL — AB
Specific Gravity, Urine: 1.043 — ABNORMAL HIGH (ref 1.005–1.030)
pH: 5 (ref 5.0–8.0)

## 2020-02-06 LAB — RAPID URINE DRUG SCREEN, HOSP PERFORMED
Amphetamines: NOT DETECTED
Barbiturates: NOT DETECTED
Benzodiazepines: POSITIVE — AB
Cocaine: NOT DETECTED
Opiates: POSITIVE — AB
Tetrahydrocannabinol: POSITIVE — AB

## 2020-02-06 MED ORDER — SODIUM CHLORIDE 0.9 % IV BOLUS
1000.0000 mL | Freq: Once | INTRAVENOUS | Status: AC
  Administered 2020-02-06: 1000 mL via INTRAVENOUS

## 2020-02-06 MED ORDER — METOCLOPRAMIDE HCL 5 MG/ML IJ SOLN
10.0000 mg | Freq: Once | INTRAMUSCULAR | Status: AC
  Administered 2020-02-06: 10 mg via INTRAVENOUS
  Filled 2020-02-06: qty 2

## 2020-02-06 MED ORDER — HYOSCYAMINE SULFATE 0.125 MG SL SUBL
0.2500 mg | SUBLINGUAL_TABLET | Freq: Once | SUBLINGUAL | Status: AC
  Administered 2020-02-06: 0.25 mg via SUBLINGUAL
  Filled 2020-02-06: qty 2

## 2020-02-06 MED ORDER — ALUM & MAG HYDROXIDE-SIMETH 200-200-20 MG/5ML PO SUSP
30.0000 mL | Freq: Once | ORAL | Status: AC
  Administered 2020-02-06: 30 mL via ORAL
  Filled 2020-02-06: qty 30

## 2020-02-06 MED ORDER — LIDOCAINE VISCOUS HCL 2 % MT SOLN
15.0000 mL | Freq: Once | OROMUCOSAL | Status: AC
  Administered 2020-02-06: 15 mL via ORAL
  Filled 2020-02-06: qty 15

## 2020-02-06 MED ORDER — ONDANSETRON 4 MG PO TBDP: 4.0000 mg | ORAL_TABLET | Freq: Three times a day (TID) | ORAL | 0 refills | Status: AC | PRN

## 2020-02-06 NOTE — ED Notes (Signed)
Patient is at CT Scan

## 2020-02-06 NOTE — ED Provider Notes (Signed)
I assumed care of this patient.  Please see previous provider note for further details of Hx, PE.  Briefly patient is a 45 y.o. female who presented with abd pain with N/V. Not in DKA. Tachy and getting IVF. Pending UA.  UA w/o infection. Patient requesting to be discharged.  The patient appears reasonably screened and/or stabilized for discharge and I doubt any other medical condition or other [facility] requiring further screening, evaluation, or treatment in the ED at this time prior to discharge. Safe for discharge with strict return precautions.  Disposition: Discharge  Condition: Good  I have discussed the results, Dx and Tx plan with the patient/family who expressed understanding and agree(s) with the plan. Discharge instructions discussed at length. The patient/family was given strict return precautions who verbalized understanding of the instructions. No further questions at time of discharge.    ED Discharge Orders         Ordered    ondansetron (ZOFRAN ODT) 4 MG disintegrating tablet  Every 8 hours PRN        02/06/20 0223            Follow Up: Vincente Liberty, MD [place] [address] [phone]  Call  in 3-5 days, If symptoms do not improve or  worsen        Riverlyn Kizziah, Grayce Sessions, MD 02/07/20 [phone]

## 2020-02-06 NOTE — ED Notes (Signed)
Pt ambulatory to restroom with no assistance or difficulty.

## 2020-02-07 LAB — URINE CULTURE: Culture: >=100000 — AB

## 2020-03-18 ENCOUNTER — Encounter: Payer: Self-pay | Admitting: *Deleted

## 2020-03-18 ENCOUNTER — Ambulatory Visit: Payer: Medicaid Other | Admitting: Neurology

## 2020-03-20 ENCOUNTER — Ambulatory Visit: Payer: Medicaid Other | Admitting: Neurology

## 2020-03-20 ENCOUNTER — Telehealth: Payer: Self-pay | Admitting: *Deleted

## 2020-03-20 ENCOUNTER — Encounter: Payer: Self-pay | Admitting: Neurology

## 2020-03-20 NOTE — Telephone Encounter (Signed)
No showed new patient appointment. 

## 2020-05-01 ENCOUNTER — Encounter: Payer: Self-pay | Admitting: Neurology

## 2020-05-01 ENCOUNTER — Ambulatory Visit: Payer: Medicaid Other | Admitting: Neurology

## 2020-05-01 VITALS — BP 145/95 | HR 87 | Ht 60.0 in | Wt 230.5 lb

## 2020-05-01 DIAGNOSIS — M5442 Lumbago with sciatica, left side: Secondary | ICD-10-CM

## 2020-05-01 DIAGNOSIS — G8929 Other chronic pain: Secondary | ICD-10-CM | POA: Diagnosis not present

## 2020-05-01 DIAGNOSIS — M5441 Lumbago with sciatica, right side: Secondary | ICD-10-CM | POA: Diagnosis not present
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
## 2020-05-05 LAB — ANA W/REFLEX IF POSITIVE
Anti JO-1: <0.2 AI (ref 0.0–0.9)
Anti Nuclear Antibody (ANA): POSITIVE — AB
Centromere Ab Screen: <0.2 AI (ref 0.0–0.9)
Chromatin Ab SerPl-aCnc: <0.2 AI (ref 0.0–0.9)
ENA RNP Ab: 3.2 AI — ABNORMAL HIGH (ref 0.0–0.9)
ENA SM Ab Ser-aCnc: <0.2 AI (ref 0.0–0.9)
ENA SSA (RO) Ab: <0.2 AI (ref 0.0–0.9)
ENA SSB (LA) Ab: <0.2 AI (ref 0.0–0.9)
Scleroderma (Scl-70) (ENA) Antibody, IgG: <0.2 AI (ref 0.0–0.9)
dsDNA Ab: <1 IU/mL (ref 0–9)

## 2020-05-05 LAB — COMPREHENSIVE METABOLIC PANEL
ALT: 22 IU/L (ref 0–32)
AST: 24 IU/L (ref 0–40)
Albumin/Globulin Ratio: 1.6 (ref 1.2–2.2)
Albumin: 4.7 g/dL (ref 3.8–4.8)
Alkaline Phosphatase: 56 IU/L (ref 44–121)
BUN/Creatinine Ratio: 12 (ref 9–23)
BUN: 9 mg/dL (ref 6–24)
Bilirubin Total: 0.4 mg/dL (ref 0.0–1.2)
CO2: 24 mmol/L (ref 20–29)
Calcium: 10 mg/dL (ref 8.7–10.2)
Chloride: 97 mmol/L (ref 96–106)
Creatinine, Ser: 0.78 mg/dL (ref 0.57–1.00)
Globulin, Total: 3 g/dL (ref 1.5–4.5)
Glucose: 142 mg/dL — ABNORMAL HIGH (ref 65–99)
Potassium: 4.3 mmol/L (ref 3.5–5.2)
Sodium: 138 mmol/L (ref 134–144)
Total Protein: 7.7 g/dL (ref 6.0–8.5)
eGFR: 97 mL/min/{1.73_m2} (ref >59)

## 2020-05-05 LAB — CBC WITH DIFFERENTIAL/PLATELET
Basophils Absolute: 0 10*3/uL (ref 0.0–0.2)
Basos: 0 %
EOS (ABSOLUTE): 0.1 10*3/uL (ref 0.0–0.4)
Eos: 2 %
Hematocrit: 41.1 % (ref 34.0–46.6)
Hemoglobin: 13 g/dL (ref 11.1–15.9)
Immature Grans (Abs): 0 10*3/uL (ref 0.0–0.1)
Immature Granulocytes: 0 %
Lymphocytes Absolute: 2 10*3/uL (ref 0.7–3.1)
Lymphs: 25 %
MCH: 27.8 pg (ref 26.6–33.0)
MCHC: 31.6 g/dL (ref 31.5–35.7)
MCV: 88 fL (ref 79–97)
Monocytes Absolute: 0.5 10*3/uL (ref 0.1–0.9)
Monocytes: 6 %
Neutrophils Absolute: 5.4 10*3/uL (ref 1.4–7.0)
Neutrophils: 67 %
Platelets: 333 10*3/uL (ref 150–450)
RBC: 4.67 x10E6/uL (ref 3.77–5.28)
RDW: 12.7 % (ref 11.7–15.4)
WBC: 8.1 10*3/uL (ref 3.4–10.8)

## 2020-05-05 LAB — MULTIPLE MYELOMA PANEL, SERUM
Albumin SerPl Elph-Mcnc: 4 g/dL (ref 2.9–4.4)
Albumin/Glob SerPl: 1.1 (ref 0.7–1.7)
Alpha 1: 0.2 g/dL (ref 0.0–0.4)
Alpha2 Glob SerPl Elph-Mcnc: 0.8 g/dL (ref 0.4–1.0)
B-Globulin SerPl Elph-Mcnc: 1.5 g/dL — ABNORMAL HIGH (ref 0.7–1.3)
Gamma Glob SerPl Elph-Mcnc: 1.2 g/dL (ref 0.4–1.8)
Globulin, Total: 3.7 g/dL (ref 2.2–3.9)
IgA/Immunoglobulin A, Serum: 365 mg/dL — ABNORMAL HIGH (ref 87–352)
IgG (Immunoglobin G), Serum: 1319 mg/dL (ref 586–1602)
IgM (Immunoglobulin M), Srm: 82 mg/dL (ref 26–217)

## 2020-05-05 LAB — C-REACTIVE PROTEIN: CRP: 5 mg/L (ref 0–10)

## 2020-05-05 LAB — FOLATE: Folate: 8.8 ng/mL (ref >3.0)

## 2020-05-05 LAB — VITAMIN B12: Vitamin B-12: 346 pg/mL (ref 232–1245)

## 2020-05-05 LAB — HIV ANTIBODY (ROUTINE TESTING W REFLEX): HIV Screen 4th Generation wRfx: NONREACTIVE

## 2020-05-05 LAB — HGB A1C W/O EAG: Hgb A1c MFr Bld: 7.3 % — ABNORMAL HIGH (ref 4.8–5.6)

## 2020-05-05 LAB — SEDIMENTATION RATE: Sed Rate: 22 mm/hr (ref 0–32)

## 2020-05-05 LAB — TSH: TSH: 1.2 u[IU]/mL (ref 0.450–4.500)

## 2020-05-05 LAB — CK: Total CK: 289 U/L — ABNORMAL HIGH (ref 32–182)

## 2020-05-05 LAB — RPR: RPR Ser Ql: NONREACTIVE

## 2020-05-05 LAB — VITAMIN D 25 HYDROXY (VIT D DEFICIENCY, FRACTURES): Vit D, 25-Hydroxy: <4 ng/mL — ABNORMAL LOW (ref 30.0–100.0)

## 2020-05-06 ENCOUNTER — Telehealth: Payer: Self-pay | Admitting: Neurology

## 2020-05-06 MED ORDER — VITAMIN D (ERGOCALCIFEROL) 1.25 MG (50000 UNIT) PO CAPS: 50000.0000 [IU] | ORAL_CAPSULE | ORAL | 0 refills | Status: DC

## 2020-05-06 NOTE — Telephone Encounter (Signed)
Please call patient, laboratory evaluation showed  Significantly decreased vitamin D level less than 4, I have written vitamin D prescription, 50,000 units 1 capsule every week for 8 weeks, then followed by over-the-counter D3 supplement 1000 units 2 tablets every day  Meds ordered this encounter  Medications  . Vitamin D, Ergocalciferol, (DRISDOL) 1.25 MG (50000 UNIT) CAPS capsule    Sig: Take 1 capsule (50,000 Units total) by mouth every 7 (seven) days.    Dispense:  8 capsule    Refill:  0     Elevated A1c 7.3, continue work with her primary care, to have better control of diabetes, goal A1c less than 7  Slight elevated CK level has unknown clinical significance  Rest of the laboratory evaluation showed no significant abnormality  I have forwarded the lab result to her primary care Vincente Liberty, MD

## 2020-05-06 NOTE — Telephone Encounter (Signed)
I spoke to the patient and provided her with the lab results. She verbalized understanding of the findings and plan. She will also call to schedule a follow up with her PCP.

## 2020-05-06 NOTE — Telephone Encounter (Signed)
Left message requesting a return call.

## 2021-01-13 ENCOUNTER — Ambulatory Visit: Payer: Medicaid Other | Admitting: Obstetrics and Gynecology

## 2021-01-21 ENCOUNTER — Ambulatory Visit: Payer: Medicaid Other | Admitting: Obstetrics

## 2021-01-27 ENCOUNTER — Other Ambulatory Visit (HOSPITAL_COMMUNITY)
Admission: RE | Admit: 2021-01-27 | Discharge: 2021-01-27 | Disposition: A | Discharge status: Home / Self Care | Payer: Medicaid Other | Source: Ambulatory Visit | Attending: Obstetrics and Gynecology | Admitting: Obstetrics and Gynecology

## 2021-01-27 ENCOUNTER — Encounter: Payer: Self-pay | Admitting: Obstetrics

## 2021-01-27 ENCOUNTER — Other Ambulatory Visit: Payer: Self-pay

## 2021-01-27 ENCOUNTER — Ambulatory Visit (INDEPENDENT_AMBULATORY_CARE_PROVIDER_SITE_OTHER): Payer: Medicaid Other | Admitting: Obstetrics

## 2021-01-27 VITALS — BP 146/88 | HR 86 | Ht 61.0 in | Wt 223.0 lb

## 2021-01-27 DIAGNOSIS — N946 Dysmenorrhea, unspecified: Secondary | ICD-10-CM | POA: Diagnosis not present

## 2021-01-27 DIAGNOSIS — N898 Other specified noninflammatory disorders of vagina: Secondary | ICD-10-CM

## 2021-01-27 DIAGNOSIS — F17200 Nicotine dependence, unspecified, uncomplicated: Secondary | ICD-10-CM

## 2021-01-27 DIAGNOSIS — Z1239 Encounter for other screening for malignant neoplasm of breast: Secondary | ICD-10-CM | POA: Diagnosis not present

## 2021-01-27 DIAGNOSIS — Z6841 Body Mass Index (BMI) 40.0 and over, adult: Secondary | ICD-10-CM

## 2021-01-27 DIAGNOSIS — Z01419 Encounter for gynecological examination (general) (routine) without abnormal findings: Secondary | ICD-10-CM | POA: Insufficient documentation

## 2021-01-27 NOTE — Progress Notes (Addendum)
Subjective:        Andrea Montgomery is a 46 y.o. female here for a routine exam.  Current complaints: Heavy and painful periods,  Had endometrial ablation dnne several years ago.  Would now like definitive surgical management.  Personal health questionnaire:  Is [name], have a family history of breast and/or ovarian cancer: no Is there a family history of uterine cancer diagnosed at age < 29, gastrointestinal cancer, urinary tract cancer, family member who is a Field seismologist syndrome-associated carrier: no Is the patient overweight and hypertensive, family history of diabetes, personal history of gestational diabetes, preeclampsia or PCOS: no Is patient over 55, have PCOS,  family history of premature CHD under age 46, diabetes, smoke, have hypertension or peripheral artery disease:  no At any time, has a partner hit, kicked or otherwise hurt or frightened you?: no Over the past 2 weeks, have you felt down, depressed or hopeless?: no Over the past 2 weeks, have you felt little interest or pleasure in doing things?:no   Gynecologic History Patient's last menstrual period was 01/17/2021. Contraception: abstinence Last Pap: 2019. Results were: normal Last mammogram: unknown. Results were: normal  Obstetric History OB History  No obstetric history on file.    Past Medical History:  Diagnosis Date   Anxiety    Asthma    COPD (chronic obstructive pulmonary disease) ([facility])    Degenerative disc disease, lumbar    Diabetes mellitus    Emphysema of lung ([place])    Hypertension    Iron deficiency anemia 03/12/2014   Leg weakness    Low back pain     Past Surgical History:  Procedure Laterality Date   ceserean section times 2     DILITATION & CURRETTAGE/HYSTROSCOPY WITH HYDROTHERMAL ABLATION N/A 10/25/2014   Procedure: DILATATION & CURETTAGE/HYSTEROSCOPY WITH HYDROTHERMAL ABLATION;  Surgeon: Shelly Bombard, MD;  Location: [facility];  Service: Gynecology;  Laterality: N/A;    TUBAL LIGATION     tumor removed from leg       Current Outpatient Medications:    albuterol (PROVENTIL HFA;VENTOLIN HFA) 108 (90 BASE) MCG/ACT inhaler, Inhale 2 puffs into the lungs every 6 (six) hours as needed for wheezing or shortness of breath. , Disp: , Rfl:    ALPRAZolam (XANAX) 1 MG tablet, Take 1 mg by mouth 3 (three) times daily., Disp: , Rfl:    celecoxib (CELEBREX) 200 MG capsule, Take 1 capsule (200 mg total) by mouth 2 (two) times daily as needed., Disp: 60 capsule, Rfl: 5   Cholecalciferol (VITAMIN D-3) 125 MCG (5000 UT) TABS, Take 1 tablet by mouth daily., Disp: 90 tablet, Rfl: 3   Fluticasone-Salmeterol (ADVAIR) 250-50 MCG/DOSE AEPB, Inhale 1 puff into the lungs every 12 (twelve) hours. For shortness of breath, Disp: , Rfl:    furosemide (LASIX) 40 MG tablet, Take 40 mg by mouth daily., Disp: , Rfl:    Glucosamine Sulfate 1000 MG CAPS, Take 1 capsule (1,000 mg total) by mouth 2 (two) times daily., Disp: 180 each, Rfl: 3   HYDROcodone-acetaminophen (NORCO) 10-325 MG tablet, Take 1 tablet by mouth every 6 (six) hours as needed., Disp: , Rfl: 0   ibuprofen (ADVIL,MOTRIN) 800 MG tablet, Take 1 tablet (800 mg total) by mouth 3 (three) times daily., Disp: 30 tablet, Rfl: 5   insulin aspart protamine-insulin aspart (NOVOLOG 70/30) (70-30) 100 UNIT/ML injection, Inject 25 Units into the skin 2 (two) times daily with a meal. , Disp: , Rfl:    insulin  glargine (LANTUS) 100 UNIT/ML injection, Inject 15 Units into the skin at bedtime., Disp: , Rfl:    losartan (COZAAR) 50 MG tablet, Take 50 mg by mouth daily., Disp: , Rfl:    metFORMIN (GLUCOPHAGE) 1000 MG tablet, Take 1,000 mg by mouth 2 (two) times daily with a meal., Disp: , Rfl:    nabumetone (RELAFEN) 500 MG tablet, Take 1 tablet (500 mg total) by mouth 2 (two) times daily as needed., Disp: 60 tablet, Rfl: 6   polyethylene glycol (MIRALAX) packet, Take 17 g by mouth daily., Disp: 14 each, Rfl: 0   Vitamin D, Ergocalciferol, (DRISDOL)  1.25 MG (50000 UNIT) CAPS capsule, Take 1 capsule (50,000 Units total) by mouth every 7 (seven) days., Disp: 8 capsule, Rfl: 0 No Known Allergies  Social History   Tobacco Use   Smoking status: Every Day    Types: Cigarettes   Smokeless tobacco: Never   Tobacco comments:    2 cigarettes/day  Substance Use Topics   Alcohol use: No    Alcohol/week: 0.0 standard drinks    Family History  Problem Relation Age of Onset   Hypertension Mother    Heart disease Mother    Diabetes Father       Review of Systems  Constitutional: negative for fatigue and weight loss Respiratory: negative for cough and wheezing Cardiovascular: negative for chest pain, fatigue and palpitations Gastrointestinal: negative for abdominal pain and change in bowel habits Musculoskeletal:negative for myalgias Neurological: negative for gait problems and tremors Behavioral/Psych: negative for abusive relationship, depression Endocrine: negative for temperature intolerance    Genitourinary: positive for vaginal dischargenegative for abnormal menstrual periods, genital lesions, hot flashes, sexual problems  Integument/breast: negative for breast lump, breast tenderness, nipple discharge and skin lesion(s)    Objective:       BP (!) 146/88    Pulse 86    Ht 5\' 1"  (1.549 m)    Wt 223 lb (101.2 kg)    LMP 01/17/2021    BMI 42.14 kg/m  General:   Alert and no distress  Skin:   no rash or abnormalities  Lungs:   clear to auscultation bilaterally  Heart:   regular rate and rhythm, S1, S2 normal, no murmur, click, rub or gallop  Breasts:   normal without suspicious masses, skin or nipple changes or axillary nodes  Abdomen:  normal findings: no organomegaly, soft, non-tender and no hernia  Pelvis:  External genitalia: normal general appearance Urinary system: urethral meatus normal and bladder without fullness, nontender Vaginal: normal without tenderness, induration or masses Cervix: normal appearance Adnexa:  normal bimanual exam Uterus: anteverted and non-tender, normal size   Lab Review Urine pregnancy test Labs reviewed yes Radiologic studies reviewed yes  .I have spent a total of 20 minutes of face-to-face time, excluding clinical staff time, reviewing notes and preparing to see patient, ordering tests and/or medications, and counseling the patient.   Assessment:    1. Encounter for routine gynecological examination with Papanicolaou smear of cervix Rx: - Cytology - PAP( [place])  2. Vaginal discharge Rx: - Cervicovaginal ancillary only( [place])  3. Severe dysmenorrhea - Ibuprofen prn  4. Screening breast examination Rx: - MM 3D SCREEN BREAST BILATERAL; Future  5. Class 3 severe obesity due to excess calories without serious comorbidity with body mass index (BMI) of 40.0 to 44.9 in adult ([place])  6. Tobacco dependence     Plan:    Education reviewed: calcium supplements, depression evaluation, low fat, low cholesterol diet,  safe sex/STD prevention, self breast exams, smoking cessation, and weight bearing exercise. Mammogram ordered. Follow up in: 4 weeks.    Orders Placed This Encounter  Procedures   MM 3D SCREEN BREAST BILATERAL    Standing Status:   Future    Standing Expiration Date:   01/26/2022    Order Specific Question:   Reason for Exam (SYMPTOM  OR DIAGNOSIS REQUIRED)    Answer:   Screening    Order Specific Question:   Is the patient pregnant?    Answer:   No    Order Specific Question:   Preferred imaging location?    Answer:   [facility]     .Shelly Bombard, MD 01/27/2021 2:52 PM

## 2021-01-27 NOTE — Progress Notes (Signed)
GYN presents for AEX/PAP/STD screening.  C/o heavy periods, blood clots, pain 9/10 x 16 years.

## 2021-01-29 LAB — CERVICOVAGINAL ANCILLARY ONLY
Bacterial Vaginitis (gardnerella): POSITIVE — AB
Candida Glabrata: NEGATIVE
Candida Vaginitis: NEGATIVE
Chlamydia: NEGATIVE
Comment: NEGATIVE
Comment: NEGATIVE
Comment: NEGATIVE
Comment: NEGATIVE
Comment: NEGATIVE
Comment: NORMAL
Neisseria Gonorrhea: NEGATIVE
Trichomonas: POSITIVE — AB

## 2021-01-29 LAB — CYTOLOGY - PAP
Comment: NEGATIVE
Diagnosis: NEGATIVE
High risk HPV: NEGATIVE

## 2021-01-30 ENCOUNTER — Other Ambulatory Visit: Payer: Self-pay | Admitting: Obstetrics

## 2021-01-30 DIAGNOSIS — B9689 Other specified bacterial agents as the cause of diseases classified elsewhere: Secondary | ICD-10-CM

## 2021-01-30 MED ORDER — METRONIDAZOLE 500 MG PO TABS: 500.0000 mg | ORAL_TABLET | Freq: Two times a day (BID) | ORAL | 2 refills | Status: DC

## 2021-01-30 NOTE — Progress Notes (Signed)
TC to inform patient of BV and trich and RX Flagyl. No answer. Left HIPPA compliant VM.

## 2021-02-20 ENCOUNTER — Ambulatory Visit: Payer: Medicaid Other

## 2021-02-25 ENCOUNTER — Institutional Professional Consult (permissible substitution): Payer: Medicaid Other | Admitting: Obstetrics and Gynecology

## 2021-03-04 ENCOUNTER — Emergency Department (HOSPITAL_BASED_OUTPATIENT_CLINIC_OR_DEPARTMENT_OTHER): Payer: Medicaid Other

## 2021-03-04 ENCOUNTER — Other Ambulatory Visit: Payer: Self-pay

## 2021-03-04 ENCOUNTER — Emergency Department (HOSPITAL_COMMUNITY): Payer: Medicaid Other

## 2021-03-04 ENCOUNTER — Emergency Department (HOSPITAL_COMMUNITY)
Admission: EM | Admit: 2021-03-04 | Discharge: 2021-03-04 | Disposition: A | Discharge status: Home / Self Care | Payer: Medicaid Other | Attending: Emergency Medicine | Admitting: Emergency Medicine

## 2021-03-04 ENCOUNTER — Encounter (HOSPITAL_COMMUNITY): Payer: Self-pay

## 2021-03-04 DIAGNOSIS — R1013 Epigastric pain: Secondary | ICD-10-CM | POA: Diagnosis not present

## 2021-03-04 DIAGNOSIS — Z794 Long term (current) use of insulin: Secondary | ICD-10-CM | POA: Insufficient documentation

## 2021-03-04 DIAGNOSIS — M545 Low back pain, unspecified: Secondary | ICD-10-CM

## 2021-03-04 DIAGNOSIS — Z20822 Contact with and (suspected) exposure to covid-19: Secondary | ICD-10-CM | POA: Insufficient documentation

## 2021-03-04 DIAGNOSIS — D72829 Elevated white blood cell count, unspecified: Secondary | ICD-10-CM

## 2021-03-04 DIAGNOSIS — R000 Tachycardia, unspecified: Secondary | ICD-10-CM | POA: Diagnosis not present

## 2021-03-04 DIAGNOSIS — R61 Generalized hyperhidrosis: Secondary | ICD-10-CM | POA: Diagnosis not present

## 2021-03-04 DIAGNOSIS — IMO0001 Reserved for inherently not codable concepts without codable children: Secondary | ICD-10-CM

## 2021-03-04 DIAGNOSIS — M7989 Other specified soft tissue disorders: Secondary | ICD-10-CM | POA: Diagnosis not present

## 2021-03-04 DIAGNOSIS — M79605 Pain in left leg: Secondary | ICD-10-CM | POA: Diagnosis not present

## 2021-03-04 DIAGNOSIS — R0602 Shortness of breath: Secondary | ICD-10-CM | POA: Diagnosis not present

## 2021-03-04 DIAGNOSIS — C419 Malignant neoplasm of bone and articular cartilage, unspecified: Secondary | ICD-10-CM

## 2021-03-04 DIAGNOSIS — M79604 Pain in right leg: Secondary | ICD-10-CM | POA: Diagnosis not present

## 2021-03-04 DIAGNOSIS — Z859 Personal history of malignant neoplasm, unspecified: Secondary | ICD-10-CM | POA: Insufficient documentation

## 2021-03-04 DIAGNOSIS — B349 Viral infection, unspecified: Secondary | ICD-10-CM

## 2021-03-04 DIAGNOSIS — M549 Dorsalgia, unspecified: Secondary | ICD-10-CM | POA: Diagnosis present

## 2021-03-04 LAB — COMPREHENSIVE METABOLIC PANEL
ALT: 21 U/L (ref 0–44)
AST: 26 U/L (ref 15–41)
Albumin: 3.4 g/dL — ABNORMAL LOW (ref 3.5–5.0)
Alkaline Phosphatase: 51 U/L (ref 38–126)
Anion gap: 14 (ref 5–15)
BUN: 22 mg/dL — ABNORMAL HIGH (ref 6–20)
CO2: 22 mmol/L (ref 22–32)
Calcium: 8.7 mg/dL — ABNORMAL LOW (ref 8.9–10.3)
Chloride: 96 mmol/L — ABNORMAL LOW (ref 98–111)
Creatinine, Ser: 1.36 mg/dL — ABNORMAL HIGH (ref 0.44–1.00)
GFR, Estimated: 49 mL/min — ABNORMAL LOW (ref >60)
Glucose, Bld: 316 mg/dL — ABNORMAL HIGH (ref 70–99)
Potassium: 3.9 mmol/L (ref 3.5–5.1)
Sodium: 132 mmol/L — ABNORMAL LOW (ref 135–145)
Total Bilirubin: 0.4 mg/dL (ref 0.3–1.2)
Total Protein: 6.7 g/dL (ref 6.5–8.1)

## 2021-03-04 LAB — I-STAT BETA HCG BLOOD, ED (MC, WL, AP ONLY): I-stat hCG, quantitative: <5 m[IU]/mL (ref <5)

## 2021-03-04 LAB — URINALYSIS, ROUTINE W REFLEX MICROSCOPIC
Bilirubin Urine: NEGATIVE
Glucose, UA: >=500 mg/dL — AB
Ketones, ur: 80 mg/dL — AB
Leukocytes,Ua: NEGATIVE
Nitrite: NEGATIVE
Protein, ur: >=300 mg/dL — AB
RBC / HPF: >50 RBC/hpf — ABNORMAL HIGH (ref 0–5)
Specific Gravity, Urine: >1.046 — ABNORMAL HIGH (ref 1.005–1.030)
pH: 6 (ref 5.0–8.0)

## 2021-03-04 LAB — I-STAT CHEM 8, ED
BUN: 30 mg/dL — ABNORMAL HIGH (ref 6–20)
Calcium, Ion: 0.85 mmol/L — CL (ref 1.15–1.40)
Chloride: 98 mmol/L (ref 98–111)
Creatinine, Ser: 1.2 mg/dL — ABNORMAL HIGH (ref 0.44–1.00)
Glucose, Bld: 312 mg/dL — ABNORMAL HIGH (ref 70–99)
HCT: 51 % — ABNORMAL HIGH (ref 36.0–46.0)
Hemoglobin: 17.3 g/dL — ABNORMAL HIGH (ref 12.0–15.0)
Potassium: 4.6 mmol/L (ref 3.5–5.1)
Sodium: 129 mmol/L — ABNORMAL LOW (ref 135–145)
TCO2: 26 mmol/L (ref 22–32)

## 2021-03-04 LAB — CBC WITH DIFFERENTIAL/PLATELET
Abs Immature Granulocytes: 0.11 10*3/uL — ABNORMAL HIGH (ref 0.00–0.07)
Basophils Absolute: 0.1 10*3/uL (ref 0.0–0.1)
Basophils Relative: 0 %
Eosinophils Absolute: 0.1 10*3/uL (ref 0.0–0.5)
Eosinophils Relative: 0 %
HCT: 47.6 % — ABNORMAL HIGH (ref 36.0–46.0)
Hemoglobin: 14.8 g/dL (ref 12.0–15.0)
Immature Granulocytes: 1 %
Lymphocytes Relative: 17 %
Lymphs Abs: 3.1 10*3/uL (ref 0.7–4.0)
MCH: 26.5 pg (ref 26.0–34.0)
MCHC: 31.1 g/dL (ref 30.0–36.0)
MCV: 85.3 fL (ref 80.0–100.0)
Monocytes Absolute: 1.4 10*3/uL — ABNORMAL HIGH (ref 0.1–1.0)
Monocytes Relative: 8 %
Neutro Abs: 13.3 10*3/uL — ABNORMAL HIGH (ref 1.7–7.7)
Neutrophils Relative %: 74 %
Platelets: 439 10*3/uL — ABNORMAL HIGH (ref 150–400)
RBC: 5.58 MIL/uL — ABNORMAL HIGH (ref 3.87–5.11)
RDW: 16 % — ABNORMAL HIGH (ref 11.5–15.5)
WBC: 18 10*3/uL — ABNORMAL HIGH (ref 4.0–10.5)
nRBC: 0 % (ref 0.0–0.2)

## 2021-03-04 LAB — RESP PANEL BY RT-PCR (FLU A&B, COVID) ARPGX2
Influenza A by PCR: NEGATIVE
Influenza B by PCR: NEGATIVE
SARS Coronavirus 2 by RT PCR: NEGATIVE

## 2021-03-04 LAB — TROPONIN I (HIGH SENSITIVITY)
Troponin I (High Sensitivity): 30 ng/L — ABNORMAL HIGH (ref <18)
Troponin I (High Sensitivity): 44 ng/L — ABNORMAL HIGH (ref <18)
Troponin I (High Sensitivity): 31 ng/L — ABNORMAL HIGH (ref <18)

## 2021-03-04 LAB — LACTIC ACID, PLASMA: Lactic Acid, Venous: 1.3 mmol/L (ref 0.5–1.9)

## 2021-03-04 MED ORDER — FENTANYL CITRATE PF 50 MCG/ML IJ SOSY
25.0000 ug | PREFILLED_SYRINGE | Freq: Once | INTRAMUSCULAR | Status: AC
  Administered 2021-03-04: 25 ug via INTRAVENOUS
  Filled 2021-03-04: qty 1

## 2021-03-04 MED ORDER — OXYCODONE-ACETAMINOPHEN 5-325 MG PO TABS: 1.0000 | ORAL_TABLET | ORAL | 0 refills | Status: DC | PRN

## 2021-03-04 MED ORDER — MORPHINE SULFATE (PF) 4 MG/ML IV SOLN
4.0000 mg | Freq: Once | INTRAVENOUS | Status: AC
  Administered 2021-03-04: 4 mg via INTRAVENOUS
  Filled 2021-03-04: qty 1

## 2021-03-04 MED ORDER — SODIUM CHLORIDE 0.9 % IV BOLUS
1000.0000 mL | Freq: Once | INTRAVENOUS | Status: AC
Start: 2021-03-04 — End: 2021-03-04
  Administered 2021-03-04: 1000 mL via INTRAVENOUS

## 2021-03-04 MED ORDER — SODIUM CHLORIDE 0.9 % IV BOLUS
1000.0000 mL | Freq: Once | INTRAVENOUS | Status: AC
  Administered 2021-03-04: 1000 mL via INTRAVENOUS

## 2021-03-04 MED ORDER — MORPHINE SULFATE (PF) 4 MG/ML IV SOLN
4.0000 mg | Freq: Once | INTRAVENOUS | Status: DC
Start: 2021-03-04 — End: 2021-03-04

## 2021-03-04 MED ORDER — HYDROMORPHONE HCL 1 MG/ML IJ SOLN
1.0000 mg | Freq: Once | INTRAMUSCULAR | Status: AC
  Administered 2021-03-04: 1 mg via INTRAVENOUS
  Filled 2021-03-04: qty 1

## 2021-03-04 MED ORDER — ONDANSETRON HCL 4 MG/2ML IJ SOLN
4.0000 mg | Freq: Once | INTRAMUSCULAR | Status: AC
  Administered 2021-03-04: 4 mg via INTRAVENOUS
  Filled 2021-03-04: qty 2

## 2021-03-04 MED ORDER — IOHEXOL 350 MG/ML SOLN
100.0000 mL | Freq: Once | INTRAVENOUS | Status: AC | PRN
  Administered 2021-03-04: 100 mL via INTRAVENOUS

## 2021-03-04 NOTE — ED Notes (Signed)
Patient transported to CT 

## 2021-03-04 NOTE — ED Triage Notes (Signed)
Pt BIB [facility] from home. Pt is a cancer pt and receives chemo at [facility] and received an extra round of chemo on Wednesday. Since Sunday pt has been feeling horrible, in pain and asking to die because she feels so bad. Pt did not want to come family convinced her to come.   ?

## 2021-03-04 NOTE — ED Notes (Signed)
Given ginger ale. Tolerates well. No NV. ?

## 2021-03-04 NOTE — ED Notes (Signed)
Back from CT

## 2021-03-04 NOTE — Discharge Instructions (Addendum)
You may have pain from your metastatic bone cancer. ? ?Your white blood cell count is elevated.  We sent off blood cultures and you will be called if you have a bacterial infection. ? ?I think you likely have a viral infection right now. ? ?Please take Tylenol or Motrin for pain or fever.  Please take Percocet for severe back pain ? ?Please follow-up with your oncologist and your primary care doctor ? ?Return to ER if you have worse back pain, fever, passing out.  ?

## 2021-03-04 NOTE — ED Notes (Signed)
Pt verbalized understanding of d/c instructions, meds, and followup care. Denies questions. VSS, no distress noted. Steady gait to exit with all belongings.  ?

## 2021-03-04 NOTE — ED Provider Notes (Signed)
[facility] EMERGENCY DEPARTMENT Provider Note   CSN: [id number] Arrival date & time: 03/04/21  1444     History  No chief complaint on file.   Andrea Montgomery is a 46 y.o. female hx of metastatic bone cancer here with back pain, shortness of breath, leg pain. Patient states that she is getting chemo for her metastatic bone cancer. Has bilateral leg pain. Patient also has worse back pain as well.  Patient states that she feels very sweaty.  She states that she just does not feel well.  Denies any fevers.  Patient was noted to be tachycardic and hypotensive in triage  The history is provided by the patient.      Home Medications Prior to Admission medications   Medication Sig Start Date End Date Taking? Authorizing Provider  albuterol (PROVENTIL HFA;VENTOLIN HFA) 108 (90 BASE) MCG/ACT inhaler Inhale 2 puffs into the lungs every 6 (six) hours as needed for wheezing or shortness of breath.     [provider]  ALPRAZolam Duanne Moron) 1 MG tablet Take 1 mg by mouth 3 (three) times daily.    [provider]  celecoxib (CELEBREX) 200 MG capsule Take 1 capsule (200 mg total) by mouth 2 (two) times daily as needed. 06/15/18   Hilts, Legrand Como, MD  Cholecalciferol (VITAMIN D-3) 125 MCG (5000 UT) TABS Take 1 tablet by mouth daily. 03/03/18   Hilts, Legrand Como, MD  Fluticasone-Salmeterol (ADVAIR) 250-50 MCG/DOSE AEPB Inhale 1 puff into the lungs every 12 (twelve) hours. For shortness of breath    [provider]  furosemide (LASIX) 40 MG tablet Take 40 mg by mouth daily.    [provider]  Glucosamine Sulfate 1000 MG CAPS Take 1 capsule (1,000 mg total) by mouth 2 (two) times daily. 03/03/18   Hilts, Legrand Como, MD  HYDROcodone-acetaminophen (NORCO) 10-325 MG tablet Take 1 tablet by mouth every 6 (six) hours as needed. 08/25/17   [provider]  ibuprofen (ADVIL,MOTRIN) 800 MG tablet Take 1 tablet (800 mg total) by mouth 3 (three) times daily. 04/29/17    Shelly Bombard, MD  insulin aspart protamine-insulin aspart (NOVOLOG 70/30) (70-30) 100 UNIT/ML injection Inject 25 Units into the skin 2 (two) times daily with a meal.     [provider]  insulin glargine (LANTUS) 100 UNIT/ML injection Inject 15 Units into the skin at bedtime.    [provider]  losartan (COZAAR) 50 MG tablet Take 50 mg by mouth daily.    [provider]  metFORMIN (GLUCOPHAGE) 1000 MG tablet Take 1,000 mg by mouth 2 (two) times daily with a meal.    [provider]  metroNIDAZOLE (FLAGYL) 500 MG tablet Take 1 tablet (500 mg total) by mouth 2 (two) times daily. 01/30/21   Shelly Bombard, MD  nabumetone (RELAFEN) 500 MG tablet Take 1 tablet (500 mg total) by mouth 2 (two) times daily as needed. 05/30/18   Hilts, Legrand Como, MD  polyethylene glycol [facility]) packet Take 17 g by mouth daily. 09/23/17   Fransico Meadow, PA-C  Vitamin D, Ergocalciferol, (DRISDOL) 1.25 MG (50000 UNIT) CAPS capsule Take 1 capsule (50,000 Units total) by mouth every 7 (seven) days. 05/06/20   Marcial Pacas, MD      Allergies    Patient has no known allergies.    Review of Systems   Review of Systems  Respiratory:  Positive for shortness of breath.   Musculoskeletal:  Positive for back pain.  All other systems reviewed and  are negative.  Physical Exam Updated Vital Signs BP 94/70    Pulse (!) 122    Temp 98.1 F (36.7 C) (Oral)    Resp (!) 21    Ht 5' (1.524 m)    Wt 87.1 kg    SpO2 96%    BMI 37.50 kg/m  Physical Exam Vitals and nursing note reviewed.  Constitutional:      Appearance: Normal appearance.     Comments: Uncomfortable, writhing around in pain  HENT:     Head: Normocephalic.     Nose: Nose normal.     Mouth/Throat:     Mouth: Mucous membranes are dry.  Eyes:     Extraocular Movements: Extraocular movements intact.     Pupils: Pupils are equal, round, and reactive to light.  Cardiovascular:     Rate and Rhythm: Regular rhythm. Tachycardia  present.     Pulses: Normal pulses.     Heart sounds: Normal heart sounds.  Pulmonary:     Effort: Pulmonary effort is normal.     Breath sounds: Normal breath sounds.  Abdominal:     General: Abdomen is flat.     Palpations: Abdomen is soft.     Comments: Mild epigastric tenderness  Musculoskeletal:        General: Normal range of motion.     Cervical back: Normal range of motion and neck supple.     Comments: Mild bilateral calf tenderness.  Skin:    General: Skin is warm.  Neurological:     General: No focal deficit present.     Mental Status: She is alert and oriented to person, place, and time.  Psychiatric:        Mood and Affect: Mood normal.        Behavior: Behavior normal.    ED Results / Procedures / Treatments   Labs (all labs ordered are listed, but only abnormal results are displayed) Labs Reviewed  RESP PANEL BY RT-PCR (FLU A&B, COVID) ARPGX2  CBC WITH DIFFERENTIAL/PLATELET  COMPREHENSIVE METABOLIC PANEL  I-STAT BETA HCG BLOOD, ED ([facility], [facility], [facility] ONLY)  I-STAT CHEM 8, ED  TROPONIN I (HIGH SENSITIVITY)    EKG EKG Interpretation  Date/Time:  Wednesday March 04 2021 14:58:41 EST Ventricular Rate:  118 PR Interval:  122 QRS Duration: 94 QT Interval:  349 QTC Calculation: 489 R Axis:   58 Text Interpretation: Sinus tachycardia Multiple premature complexes, vent & supraven Aberrant complex Minimal ST depression, inferior leads Borderline ST elevation, anterior leads Borderline prolonged QT interval Baseline wander in lead(s) II III aVF V6 Since last tracing rate faster Confirmed by Wandra Arthurs [phone]) on 03/04/2021 3:11:46 PM  Radiology No results found.  Procedures Procedures    Angiocath insertion Performed by: Wandra Arthurs  Consent: Verbal consent obtained. Risks and benefits: risks, benefits and alternatives were discussed Time out: Immediately prior to procedure a "time out" was called to verify the correct patient, procedure, equipment, support  staff and site/side marked as required.  Preparation: Patient was prepped and draped in the usual sterile fashion.  Vein Location: L forearm  Ultrasound Guided  Gauge: 20 long   Normal blood return and flush without difficulty Patient tolerance: Patient tolerated the procedure well with no immediate complications.    Medications Ordered in ED Medications  sodium chloride 0.9 % bolus 1,000 mL (1,000 mLs Intravenous New Bag/Given 03/04/21 1550)  ondansetron (ZOFRAN) injection 4 mg (4 mg Intravenous Given 03/04/21 1533)  fentaNYL (SUBLIMAZE) injection 25 mcg (  25 mcg Intravenous Given 03/04/21 1532)    ED Course/ Medical Decision Making/ A&P                           Medical Decision Making NEVAE PINNIX is a 46 y.o. female here presenting with shortness of breath and back pain and leg pain.  Patient states that she has metastatic bone cancer and is chemotherapy.  Patient is afebrile but tachycardic and hypotensive on arrival.  Due to her active cancer, I am concerned for possible PE or DVT.  Also consider intra-abdominal infection or UTI or pyelonephritis.  Plan to get CBC and CMP and lactate and culture and CTA chest and CT abdomen pelvis and DVT studies  9:21 PM Patient's white blood cell count is 11.  However her lactate is normal.  Patient's UA is unremarkable as well.  CT chest abdomen pelvis did not show any PE or intra-abdominal process.  DVT study is negative.  Patient was given IV fluids and pain medicine and her heart rate and blood pressure improved.  At this point, I think she likely has viral infection.  We will hold off on antibiotics for now.  Blood cultures are sent.  Patient states that her pain is tolerable now.  Moreover, patient is ambulatory has no focal neurodeficits in the lower extremities so we will hold off on an MRI right now.  Given her history of metastatic bone cancer, will give short course of pain medicine.  I told patient that if her blood cultures are positive  she will be called for admission.    Problems Addressed: Acute low back pain without sciatica, unspecified back pain laterality: acute illness or injury Leukocytosis, unspecified type: acute illness or injury Metastatic bone cancer [facility]): chronic illness or injury Viral syndrome: acute illness or injury  Amount and/or Complexity of Data Reviewed External Data Reviewed: notes. Labs: ordered. Decision-making details documented in ED Course. Radiology: ordered. ECG/medicine tests: ordered and independent interpretation performed. Decision-making details documented in ED Course.  Risk Prescription drug management.   Final Clinical Impression(s) / ED Diagnoses Final diagnoses:  None    Rx / DC Orders ED Discharge Orders     None         Drenda Freeze, MD 03/04/21 2123

## 2021-03-04 NOTE — Progress Notes (Signed)
Lower extremity venous bilateral study completed. ? ?Preliminary results relayed to Darl Householder, MD. ? ?See CV Proc for preliminary results report.  ? ?Darlin Coco, RDMS, RVT ? ?

## 2021-03-05 ENCOUNTER — Telehealth (HOSPITAL_BASED_OUTPATIENT_CLINIC_OR_DEPARTMENT_OTHER): Payer: Self-pay

## 2021-03-05 LAB — BLOOD CULTURE ID PANEL (REFLEXED) - BCID2

## 2021-03-05 NOTE — Telephone Encounter (Signed)
This RN made aware of patients positive BC by micro lab after speaking with [facility] ED MD, he advises given patients history of cancer and WBC that she should return to ED for further evaluation. Pt made aware of this via phone call and agrees.  ?

## 2021-03-06 ENCOUNTER — Inpatient Hospital Stay (HOSPITAL_COMMUNITY)
Admission: EM | Admit: 2021-03-06 | Discharge: 2021-03-08 | DRG: 690 | Discharge status: Against Medical Advice | Payer: Medicaid Other | Attending: Family Medicine | Admitting: Family Medicine

## 2021-03-06 ENCOUNTER — Inpatient Hospital Stay (HOSPITAL_COMMUNITY): Payer: Medicaid Other

## 2021-03-06 ENCOUNTER — Encounter (HOSPITAL_COMMUNITY): Payer: Self-pay | Admitting: *Deleted

## 2021-03-06 ENCOUNTER — Telehealth (HOSPITAL_BASED_OUTPATIENT_CLINIC_OR_DEPARTMENT_OTHER): Payer: Self-pay | Admitting: *Deleted

## 2021-03-06 ENCOUNTER — Emergency Department (HOSPITAL_COMMUNITY): Payer: Medicaid Other

## 2021-03-06 ENCOUNTER — Other Ambulatory Visit: Payer: Self-pay

## 2021-03-06 DIAGNOSIS — D649 Anemia, unspecified: Secondary | ICD-10-CM | POA: Diagnosis present

## 2021-03-06 DIAGNOSIS — R7881 Bacteremia: Secondary | ICD-10-CM | POA: Diagnosis present

## 2021-03-06 DIAGNOSIS — Z794 Long term (current) use of insulin: Secondary | ICD-10-CM

## 2021-03-06 DIAGNOSIS — K5903 Drug induced constipation: Secondary | ICD-10-CM | POA: Diagnosis present

## 2021-03-06 DIAGNOSIS — Z7984 Long term (current) use of oral hypoglycemic drugs: Secondary | ICD-10-CM

## 2021-03-06 DIAGNOSIS — Z7951 Long term (current) use of inhaled steroids: Secondary | ICD-10-CM | POA: Diagnosis not present

## 2021-03-06 DIAGNOSIS — T402X5A Adverse effect of other opioids, initial encounter: Secondary | ICD-10-CM | POA: Diagnosis present

## 2021-03-06 DIAGNOSIS — Z6837 Body mass index (BMI) 37.0-37.9, adult: Secondary | ICD-10-CM | POA: Diagnosis not present

## 2021-03-06 DIAGNOSIS — Z880 Allergy status to penicillin: Secondary | ICD-10-CM | POA: Diagnosis not present

## 2021-03-06 DIAGNOSIS — I10 Essential (primary) hypertension: Secondary | ICD-10-CM | POA: Diagnosis present

## 2021-03-06 DIAGNOSIS — B952 Enterococcus as the cause of diseases classified elsewhere: Secondary | ICD-10-CM | POA: Diagnosis present

## 2021-03-06 DIAGNOSIS — Z79899 Other long term (current) drug therapy: Secondary | ICD-10-CM | POA: Diagnosis not present

## 2021-03-06 DIAGNOSIS — Z888 Allergy status to other drugs, medicaments and biological substances status: Secondary | ICD-10-CM

## 2021-03-06 DIAGNOSIS — N390 Urinary tract infection, site not specified: Principal | ICD-10-CM | POA: Diagnosis present

## 2021-03-06 DIAGNOSIS — B9620 Unspecified Escherichia coli [E. coli] as the cause of diseases classified elsewhere: Secondary | ICD-10-CM | POA: Diagnosis present

## 2021-03-06 DIAGNOSIS — K5900 Constipation, unspecified: Secondary | ICD-10-CM | POA: Diagnosis not present

## 2021-03-06 DIAGNOSIS — Z20822 Contact with and (suspected) exposure to covid-19: Secondary | ICD-10-CM | POA: Diagnosis present

## 2021-03-06 DIAGNOSIS — E1169 Type 2 diabetes mellitus with other specified complication: Secondary | ICD-10-CM | POA: Diagnosis present

## 2021-03-06 DIAGNOSIS — Z5329 Procedure and treatment not carried out because of patient's decision for other reasons: Secondary | ICD-10-CM | POA: Diagnosis present

## 2021-03-06 DIAGNOSIS — M79672 Pain in left foot: Secondary | ICD-10-CM

## 2021-03-06 DIAGNOSIS — E669 Obesity, unspecified: Secondary | ICD-10-CM | POA: Diagnosis present

## 2021-03-06 DIAGNOSIS — M869 Osteomyelitis, unspecified: Secondary | ICD-10-CM | POA: Diagnosis present

## 2021-03-06 LAB — CBC WITH DIFFERENTIAL/PLATELET
Abs Immature Granulocytes: 0.03 10*3/uL (ref 0.00–0.07)
Basophils Absolute: 0.1 10*3/uL (ref 0.0–0.1)
Basophils Relative: 1 %
Eosinophils Absolute: 0.2 10*3/uL (ref 0.0–0.5)
Eosinophils Relative: 2 %
HCT: 41.9 % (ref 36.0–46.0)
Hemoglobin: 12.5 g/dL (ref 12.0–15.0)
Immature Granulocytes: 0 %
Lymphocytes Relative: 29 %
Lymphs Abs: 3 10*3/uL (ref 0.7–4.0)
MCH: 26.5 pg (ref 26.0–34.0)
MCHC: 29.8 g/dL — ABNORMAL LOW (ref 30.0–36.0)
MCV: 88.8 fL (ref 80.0–100.0)
Monocytes Absolute: 0.9 10*3/uL (ref 0.1–1.0)
Monocytes Relative: 9 %
Neutro Abs: 6.1 10*3/uL (ref 1.7–7.7)
Neutrophils Relative %: 59 %
Platelets: 356 10*3/uL (ref 150–400)
RBC: 4.72 MIL/uL (ref 3.87–5.11)
RDW: 16.3 % — ABNORMAL HIGH (ref 11.5–15.5)
WBC: 10.3 10*3/uL (ref 4.0–10.5)
nRBC: 0 % (ref 0.0–0.2)

## 2021-03-06 LAB — RESP PANEL BY RT-PCR (FLU A&B, COVID) ARPGX2
Influenza A by PCR: NEGATIVE
Influenza B by PCR: NEGATIVE
SARS Coronavirus 2 by RT PCR: NEGATIVE

## 2021-03-06 LAB — COMPREHENSIVE METABOLIC PANEL
ALT: 26 U/L (ref 0–44)
AST: 44 U/L — ABNORMAL HIGH (ref 15–41)
Albumin: 3.5 g/dL (ref 3.5–5.0)
Alkaline Phosphatase: 64 U/L (ref 38–126)
Anion gap: 10 (ref 5–15)
BUN: 10 mg/dL (ref 6–20)
CO2: 30 mmol/L (ref 22–32)
Calcium: 9.5 mg/dL (ref 8.9–10.3)
Chloride: 98 mmol/L (ref 98–111)
Creatinine, Ser: 0.82 mg/dL (ref 0.44–1.00)
GFR, Estimated: >60 mL/min (ref >60)
Glucose, Bld: 263 mg/dL — ABNORMAL HIGH (ref 70–99)
Potassium: 3.6 mmol/L (ref 3.5–5.1)
Sodium: 138 mmol/L (ref 135–145)
Total Bilirubin: <0.1 mg/dL — ABNORMAL LOW (ref 0.3–1.2)
Total Protein: 6.6 g/dL (ref 6.5–8.1)

## 2021-03-06 MED ORDER — AMITRIPTYLINE HCL 25 MG PO TABS
25.0000 mg | ORAL_TABLET | Freq: Every day | ORAL | Status: DC
  Administered 2021-03-07 (×2): 25 mg via ORAL
  Filled 2021-03-06 (×2): qty 1

## 2021-03-06 MED ORDER — INSULIN ASPART 100 UNIT/ML IJ SOLN
0.0000 [IU] | Freq: Three times a day (TID) | INTRAMUSCULAR | Status: DC
  Administered 2021-03-07 (×2): 4 [IU] via SUBCUTANEOUS
  Administered 2021-03-07: 11 [IU] via SUBCUTANEOUS
  Administered 2021-03-08: 7 [IU] via SUBCUTANEOUS
  Administered 2021-03-08: 4 [IU] via SUBCUTANEOUS

## 2021-03-06 MED ORDER — POLYETHYLENE GLYCOL 3350 17 G PO PACK
17.0000 g | PACK | Freq: Two times a day (BID) | ORAL | Status: DC
  Administered 2021-03-07 – 2021-03-08 (×3): 17 g via ORAL
  Filled 2021-03-06 (×4): qty 1

## 2021-03-06 MED ORDER — BISACODYL 10 MG RE SUPP
10.0000 mg | Freq: Once | RECTAL | Status: DC
  Filled 2021-03-06: qty 1

## 2021-03-06 MED ORDER — METFORMIN HCL 500 MG PO TABS
1000.0000 mg | ORAL_TABLET | Freq: Two times a day (BID) | ORAL | Status: DC
  Administered 2021-03-07 – 2021-03-08 (×3): 1000 mg via ORAL
  Filled 2021-03-06 (×3): qty 2

## 2021-03-06 MED ORDER — SENNA 8.6 MG PO TABS
2.0000 | ORAL_TABLET | Freq: Every day | ORAL | Status: DC
  Administered 2021-03-07 – 2021-03-08 (×3): 17.2 mg via ORAL
  Filled 2021-03-06 (×3): qty 2

## 2021-03-06 MED ORDER — INSULIN GLARGINE-YFGN 100 UNIT/ML ~~LOC~~ SOLN
15.0000 [IU] | Freq: Every day | SUBCUTANEOUS | Status: DC
  Administered 2021-03-07 (×2): 15 [IU] via SUBCUTANEOUS
  Filled 2021-03-06 (×3): qty 0.15

## 2021-03-06 MED ORDER — ALBUTEROL SULFATE HFA 108 (90 BASE) MCG/ACT IN AERS: 2.0000 | INHALATION_SPRAY | Freq: Four times a day (QID) | RESPIRATORY_TRACT | Status: DC | PRN

## 2021-03-06 MED ORDER — VANCOMYCIN HCL 1250 MG/250ML IV SOLN
1250.0000 mg | INTRAVENOUS | Status: DC
  Administered 2021-03-08: 1250 mg via INTRAVENOUS
  Filled 2021-03-06: qty 250

## 2021-03-06 MED ORDER — HYDROMORPHONE HCL 1 MG/ML IJ SOLN
1.0000 mg | Freq: Once | INTRAMUSCULAR | Status: AC
  Administered 2021-03-06: 1 mg via INTRAVENOUS
  Filled 2021-03-06: qty 1

## 2021-03-06 MED ORDER — OXYCODONE HCL 5 MG PO TABS
5.0000 mg | ORAL_TABLET | Freq: Four times a day (QID) | ORAL | Status: DC
  Filled 2021-03-06: qty 1

## 2021-03-06 MED ORDER — ALBUTEROL SULFATE (2.5 MG/3ML) 0.083% IN NEBU: 2.5000 mg | INHALATION_SOLUTION | Freq: Four times a day (QID) | RESPIRATORY_TRACT | Status: DC | PRN

## 2021-03-06 MED ORDER — NITROFURANTOIN MONOHYD MACRO 100 MG PO CAPS
100.0000 mg | ORAL_CAPSULE | Freq: Once | ORAL | Status: AC
  Administered 2021-03-06: 100 mg via ORAL
  Filled 2021-03-06: qty 1

## 2021-03-06 MED ORDER — ENOXAPARIN SODIUM 40 MG/0.4ML IJ SOSY
40.0000 mg | PREFILLED_SYRINGE | INTRAMUSCULAR | Status: DC
  Filled 2021-03-06 (×2): qty 0.4

## 2021-03-06 MED ORDER — ACETAMINOPHEN 325 MG PO TABS
325.0000 mg | ORAL_TABLET | Freq: Four times a day (QID) | ORAL | Status: DC
  Administered 2021-03-07 – 2021-03-08 (×3): 325 mg via ORAL
  Filled 2021-03-06 (×4): qty 1

## 2021-03-06 MED ORDER — MOMETASONE FURO-FORMOTEROL FUM 200-5 MCG/ACT IN AERO
2.0000 | INHALATION_SPRAY | Freq: Two times a day (BID) | RESPIRATORY_TRACT | Status: DC
  Filled 2021-03-06: qty 8.8

## 2021-03-06 MED ORDER — LOSARTAN POTASSIUM 50 MG PO TABS
50.0000 mg | ORAL_TABLET | Freq: Every day | ORAL | Status: DC
  Administered 2021-03-07 – 2021-03-08 (×2): 50 mg via ORAL
  Filled 2021-03-06 (×2): qty 1

## 2021-03-06 MED ORDER — VANCOMYCIN HCL 1500 MG/300ML IV SOLN
1500.0000 mg | Freq: Once | INTRAVENOUS | Status: AC
  Administered 2021-03-06: 1500 mg via INTRAVENOUS
  Filled 2021-03-06: qty 300

## 2021-03-06 MED ORDER — MOMETASONE FURO-FORMOTEROL FUM 200-5 MCG/ACT IN AERO
2.0000 | INHALATION_SPRAY | Freq: Two times a day (BID) | RESPIRATORY_TRACT | Status: DC
  Administered 2021-03-07 – 2021-03-08 (×3): 2 via RESPIRATORY_TRACT
  Filled 2021-03-06 (×2): qty 8.8

## 2021-03-06 NOTE — ED Provider Notes (Signed)
[facility] EMERGENCY DEPARTMENT Provider Note   CSN: [id number] Arrival date & time: 03/06/21  1410     History  Chief Complaint  Patient presents with   Abnormal Lab    Andrea Montgomery is a 46 y.o. female with history of metastatic bone cancer on chemotherapy, chronic back pain, presenting to ED with positive blood culture.  Patient was seen 2 days ago and evaluated for viral type syndrome, including abdominal bloating.  She had blood cultures and a urine culture sent and was discharged home.  Subsequently her blood culture returned positive for staph, and her urine culture was positive for Enterococcus faecalis and E. Coli (100K).  She reports she continues having diffuse lower abdominal pain and bloating.  She says she suffers from chronic constipation has not had a bowel movement in 5 days.  She has been on Norco chronically for a long time, 10 mg, but is not relieving her pain.  She is here with her fianc at the bedside.  HPI     Home Medications Prior to Admission medications   Medication Sig Start Date End Date Taking? Authorizing Provider  albuterol (PROVENTIL HFA;VENTOLIN HFA) 108 (90 BASE) MCG/ACT inhaler Inhale 2 puffs into the lungs every 6 (six) hours as needed for wheezing or shortness of breath.     [provider]  ALPRAZolam Duanne Moron) 1 MG tablet Take 1 mg by mouth 3 (three) times daily.    [provider]  amitriptyline (ELAVIL) 25 MG tablet Take 25 mg by mouth at bedtime. 01/02/21   [provider]  celecoxib (CELEBREX) 200 MG capsule Take 1 capsule (200 mg total) by mouth 2 (two) times daily as needed. Patient taking differently: Take 200 mg by mouth 2 (two) times daily as needed for mild pain. 06/15/18   Hilts, Legrand Como, MD  Cholecalciferol (VITAMIN D-3) 125 MCG (5000 UT) TABS Take 1 tablet by mouth daily. 03/03/18   Hilts, Legrand Como, MD  clindamycin (CLEOCIN) 300 MG capsule Take 300 mg by mouth every 8 (eight) hours. 02/24/21    [provider]  Fluticasone-Salmeterol (ADVAIR) 250-50 MCG/DOSE AEPB Inhale 1 puff into the lungs every 12 (twelve) hours. For shortness of breath    [provider]  furosemide (LASIX) 40 MG tablet Take 40 mg by mouth daily.    [provider]  Glucosamine Sulfate 1000 MG CAPS Take 1 capsule (1,000 mg total) by mouth 2 (two) times daily. 03/03/18   Hilts, Legrand Como, MD  HYDROcodone-acetaminophen (NORCO) 10-325 MG tablet Take 1 tablet by mouth every 6 (six) hours as needed for moderate pain. 08/25/17   [provider]  ibuprofen (ADVIL,MOTRIN) 800 MG tablet Take 1 tablet (800 mg total) by mouth 3 (three) times daily. 04/29/17   Shelly Bombard, MD  insulin aspart protamine-insulin aspart (NOVOLOG 70/30) (70-30) 100 UNIT/ML injection Inject 25 Units into the skin 2 (two) times daily with a meal.     [provider]  insulin glargine (LANTUS) 100 UNIT/ML injection Inject 15 Units into the skin at bedtime.    [provider]  losartan (COZAAR) 50 MG tablet Take 50 mg by mouth daily.    [provider]  metFORMIN (GLUCOPHAGE) 1000 MG tablet Take 1,000 mg by mouth 2 (two) times daily with a meal.    [provider]  metroNIDAZOLE (FLAGYL) 500 MG tablet Take 1 tablet (500 mg total) by mouth 2 (two) times daily. Patient not taking: Reported on 03/04/2021 01/30/21   Baltazar Najjar  A, MD  nabumetone (RELAFEN) 500 MG tablet Take 1 tablet (500 mg total) by mouth 2 (two) times daily as needed. Patient not taking: Reported on 03/04/2021 05/30/18   Hilts, Legrand Como, MD  oxyCODONE-acetaminophen (PERCOCET) 5-325 MG tablet Take 1 tablet by mouth every 4 (four) hours as needed. 03/04/21   Drenda Freeze, MD  polyethylene glycol [facility]) packet Take 17 g by mouth daily. Patient taking differently: Take 17 g by mouth daily as needed for mild constipation. 09/23/17   Fransico Meadow, PA-C  Vitamin D, Ergocalciferol, (DRISDOL) 1.25 MG (50000 UNIT) CAPS  capsule Take 1 capsule (50,000 Units total) by mouth every 7 (seven) days. 05/06/20   Marcial Pacas, MD      Allergies    Penicillins and Gabapentin    Review of Systems   Review of Systems  Physical Exam Updated Vital Signs BP (!) 120/102 (BP Location: Right Arm)    Pulse 92    Temp 99.1 F (37.3 C) (Oral)    Resp 19    Ht 5' (1.524 m)    Wt 87.1 kg    LMP 03/06/2021    SpO2 97%    BMI 37.50 kg/m  Physical Exam Constitutional:      General: She is not in acute distress. HENT:     Head: Normocephalic and atraumatic.  Eyes:     Conjunctiva/sclera: Conjunctivae normal.     Pupils: Pupils are equal, round, and reactive to light.  Cardiovascular:     Rate and Rhythm: Normal rate and regular rhythm.  Pulmonary:     Effort: Pulmonary effort is normal. No respiratory distress.  Skin:    General: Skin is warm and dry.  Neurological:     General: No focal deficit present.     Mental Status: She is alert. Mental status is at baseline.    ED Results / Procedures / Treatments   Labs (all labs ordered are listed, but only abnormal results are displayed) Labs Reviewed  COMPREHENSIVE METABOLIC PANEL - Abnormal; Notable for the following components:      Result Value   Glucose, Bld 263 (*)    AST 44 (*)    Total Bilirubin <0.1 (*)    All other components within normal limits  CBC WITH DIFFERENTIAL/PLATELET - Abnormal; Notable for the following components:   MCHC 29.8 (*)    RDW 16.3 (*)    All other components within normal limits  CULTURE, BLOOD (SINGLE)  URINE CULTURE  RESP PANEL BY RT-PCR (FLU A&B, COVID) ARPGX2    EKG None  Radiology DG Chest 2 View  Result Date: 03/06/2021 CLINICAL DATA:  Positive blood cultures, intermittent fevers EXAM: CHEST - 2 VIEW COMPARISON:  05/12/2011 FINDINGS: The heart size and mediastinal contours are within normal limits. Both lungs are clear. The visualized skeletal structures are unremarkable. IMPRESSION: No active cardiopulmonary disease.  Electronically Signed   By: Randa Ngo M.D.   On: 03/06/2021 16:56    Procedures .Critical Care Performed by: Wyvonnia Dusky, MD Authorized by: Wyvonnia Dusky, MD   Critical care provider statement:    Critical care time (minutes):  45   Critical care time was exclusive of:  Separately billable procedures and treating other patients   Critical care was time spent personally by me on the following activities:  Ordering and performing treatments and interventions, ordering and review of laboratory studies, ordering and review of radiographic studies, pulse oximetry, review of old charts, examination of patient and evaluation of patient's  response to treatment Comments:     IV antibiotics for bacteremia    Medications Ordered in ED Medications  nitrofurantoin (macrocrystal-monohydrate) ([name]) capsule 100 mg (has no administration in time range)  HYDROmorphone (DILAUDID) injection 1 mg (has no administration in time range)    ED Course/ Medical Decision Making/ A&P Clinical Course as of 03/06/21 2150  Fri Mar 06, 2021  2150 Admitted to family practice [MT]    Clinical Course User Index [MT] Langston Masker Carola Rhine, MD                           Medical Decision Making Amount and/or Complexity of Data Reviewed Labs: ordered.  Risk Prescription drug management.   Patient is here with suspected bacteremia, positive blood culture from visit 2 days ago, also positive urine culture.  Both of these unfortunately may be a contaminant, but I think the most prudent move would be to admit the patient on antibiotics and repeat these  Abdominal pain may be multifactorial, but would include blood infection, UTI, and most likely constipation, likely opioid induced.  I would recommend a bowel cleanout either here in the hospital or at home, consider Dulcolax orally and suppository.  Patient is agreeable to try.  She is however reporting intractable pain and says she has been without her  pain medications for 6 hours awaiting in the ER waiting room.  We can give her a dose of IV Dilaudid.  I will start her on IV vancomycin to cover for the staph bacteremia, and also [name] as coverage for the E. coli.  She has anaphylaxis allergies to penicillins.  I personally reviewed and interpreted the patients labs, WBC trending down 18 -> 10K.  CMP largely unremarkable.  We will repeat a blood culture and a urine culture at this time.  Chest x-ray ordered and personally reviewed and interpreted showing no focal infiltrate or pneumothorax.  Patient is CT PE study and CT abdomen pelvis performed 2 days ago and her initial infectious evaluation, there were no acute findings to explain the patient's fever and symptoms.  Specifically no PE, no acute diverticulitis.  Do not believe she needs repeat CT imaging at this time        Final Clinical Impression(s) / ED Diagnoses Final diagnoses:  Bacteremia  Urinary tract infection without hematuria, site unspecified  Constipation, unspecified constipation type    Rx / DC Orders ED Discharge Orders     None         Wyvonnia Dusky, MD 03/06/21 2122

## 2021-03-06 NOTE — Progress Notes (Signed)
Pharmacy Antibiotic Note ? ?Andrea Montgomery is a 46 y.o. female admitted on 03/06/2021 with bacteremia.  Pharmacy has been consulted for vancomycin dosing. Patient has grown coag negative staph in 1 out of 4 bottles, which could be a contaminant. Vancomycin will also cover enterococcus growing in the urine coluture. The patient is also receiving nitrofurantion for E. coli in the urine. Vitals are stable, the patient is afebrile, and SCr is 0.82. WBC is 10.3. ? ?Plan: ?Give vancomycin 1500 mg load ?Start vancomycin 1250 mg every 24 hours to provide [facility] of 507.2 using Scr 0.82 ?Continue nitrofurantion ?F/u vancomycin deescalation as sensitivities result ? ? ?Height: 5' (152.4 cm) ?Weight: 87.1 kg (192 lb 0.3 oz) ?IBW/kg (Calculated) : 45.5 ? ?Temp (24hrs), Avg:98.8 ?F (37.1 ?C), Min:98.4 ?F (36.9 ?C), Max:99.1 ?F (37.3 ?C) ? ?Recent Labs  ?Lab 03/04/21 ?1557 03/04/21 ?1655 03/04/21 ?1816 03/06/21 ?1617  ?WBC 18.0*  --   --  10.3  ?CREATININE 1.36* 1.20*  --  0.82  ?LATICACIDVEN  --   --  1.3  --   ?  ?Estimated Creatinine Clearance: 85.8 mL/min (by C-G formula based on SCr of 0.82 mg/dL).   ? ?Allergies  ?Allergen Reactions  ? Penicillins Anaphylaxis  ? Flovent Hfa [Fluticasone]   ?  migraine  ? Gabapentin Swelling  ? ? ?Antimicrobials this admission: ?Vancomycin 3/3 >>  ?Nitrofurantion 3/3 >>  ? ?Dose adjustments this admission: ?none ? ?Microbiology results: ?3/1 BCx: S. Hominis 1/4 (possible contaminant) ?3/1 UCx: E. Faecalis and E. coli  ? ?Thank you for allowing pharmacy to participate in this patient's care. ? ?Reatha Harps, PharmD ?PGY1 Pharmacy Resident ?03/06/2021 9:54 PM ?Check [email or web address] for unit specific pharmacy number ? ? ?

## 2021-03-06 NOTE — ED Triage Notes (Signed)
The pt was seen here 2 days ago for abd and back pain  she reports she was called today and come back in due to abnormal blood work  she is not happy about being here again ?

## 2021-03-06 NOTE — H&P (Addendum)
[facility] Admission History and Physical Service Pager: [phone]  Patient name: Andrea Montgomery Medical record number: [id number] Date of birth: 19-Aug-1975 Age: 46 y.o. Gender: female  Primary Care Provider: Vincente Liberty, MD Consultants: None Code Status: Full  Preferred Emergency Contact: Denny Levy (sig other) [phone]  Chief Complaint: concern for bacteremia, UTI  Assessment and Plan: Andrea Montgomery is a 46 y.o. female presenting with positive blood culture for staphylococcus spp. PMH is significant for chronic pain on high-dose opioids, T2DM, reported bone cancer (though no documented history), HTN, anemia requiring IV iron transfusions, tobacco use, asthma.  Concern for bacteremia Blood culture on 3/1 resulted positive for gram + cocci, staphylococcus hominis. Patient called and advised to come to ED. Patient well-appearing, hemodynamically stable and afebrile. WBC downtrend from 18,000 on 3/1 to 10,000 today. Question if staph is contaminant given isolate from only 1 of 2 sets of cultures and staph hominis is usual harmless skin bacteria. Although, does have left foot injury 3 weeks ago with notable bruising and exquisite pain in 5th metatarsal/toe with examination which is concerning for osteomyelitis in setting of T2DM. Left foot x-ray shows forefoot soft tissue swelling with erosive changes of 5th middle phalanx that could reflect potential osteomyelitis, which could be possible source of bacteremia. Other possible sources include skin infection, staph pneumonia though far less likely.  Meets 1/4 SIRS criteria with no concern for sepsis at this time. Admitted for observation, IV antibiotic administration until repeat blood cx results return. - Admit to FPTS med-tele, attending Dr. Madison Hickman - Vital signs per floor protocol - Repeat blood cultures, set of 2 - Left foot MRI  - S/p IV Vancomycin x1, continue dosing per pharmacy - AM labs:  CBC, BMP - PT/OT eval and treat  UTI Urine culture + E coli, enterococcus > 100,000. Denies urinary symptoms, but has abdominal pain. - S/p PO [name] 100mg  in ED (has hx anaphylaxis to penicillin)  Chronic pain with opioid-induced constipation Patient reports back pain. Prescribed opioids from multiple physicians through [place] review with MME 61.6 per day average. Prescribed hydrocodone-acetaminophen 10-325 by PCP Dr. Vincente Liberty, takes 5-6 times per day. Denies use of morphine, although PDMP shows morphine 15mg  filled monthly from pain specialist Dr. Carolynn Sayers. Will continue opioids inpatient to prevent withdrawal at half of home MME. Ideally, would decrease narcotic use in the long-term. Last BM 6 days ago and has abdominal distention on exam. UDS + for opiates, benzodiazepines and THC. - Dilaudid 1mg  q4h PRN - Miralax 17g BID - Senna 2 tablets daily - Dulcolax suppository  Type 2 Diabetes Mellitus, insulin-dependent A1c 7.6%. Takes medication as prescribed. CBG on arrival 263. Elevated to 300s on 3/1. Home regimen: Metformin 1000 mg BID, Novolog s/s  Lantus 15 qhs - CBG 4 times daily, before meals and at bedtime - Continue Metformin 1000mg  BID - Continue Lantus 15u qhs - Resistant SSI - Consult to diabetes coordinator  Asthma Chronic, stable. - Continue Advair 1 puff daily - Continue Albuterol PRN for wheezing  Hypertension Chronic, stable. BP 121/104, repeat 138/74. - Continue home meds: Losartan 50mg  daily  Anemia Asymptomatic. Hgb 12.5. Follows with heme/onc Dr. Jene Every for IV iron infusions. - Monitor Hgb with labs  Reported bone malignancy Reported hx of bone malignancy, though no documentation anywhere in EMR of this diagnosis. Patient says she received chemotherapy at [place] [facility] 5-6 years ago. Unsure of name of bone cancer.  - Will try to obtain records  from heme/onc  FEN/GI: Regular Prophylaxis: Lovenox  Disposition: Med-tele  History of  Present Illness:  Andrea Montgomery is a 46 y.o. female presenting with bacteremia. Told to come to ED after positive blood cultures resulted. Reports nausea and vomiting since Sunday. Can only eat a few bites of food before she throws up. Says she has lost 14 lbs in past 2 weeks.  Left foot and ankle wrapped in ACE bandage because 3 weeks ago she hit her foot on the dresser when tripping over her dogs. Has had continued pain since then and kept it wrapped up.  Denies any fever, chest pain, dysuria. Reports urinary frequency and chills.  Nausea and vomiting since Sunday.  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 Follows with oncologist Dr. Jene Every. Says she last had chemotherapy 5 years ago, 4 rounds. Says she has metastases in "legs, spine, and somewhere else."  Review Of Systems: Per HPI with the following additions:   Review of Systems  Constitutional:  Positive for appetite change, chills and unexpected weight change. Negative for fever.  Respiratory:  Positive for cough and shortness of breath. Negative for chest tightness and wheezing.   Cardiovascular:  Negative for chest pain.  Gastrointestinal:  Positive for abdominal distention, abdominal pain, constipation, nausea and vomiting. Negative for blood in stool and diarrhea.  Genitourinary:  Positive for frequency, pelvic pain and vaginal bleeding. Negative for dysuria and hematuria.  Musculoskeletal:  Positive for back pain.  Neurological:  Positive for dizziness and numbness. Negative for weakness and headaches.    Patient Active Problem List   Diagnosis Date Noted   Chronic right-sided low back pain with bilateral sciatica 05/01/2020   Trichomonosis 07/05/2017   Tobacco abuse 07/19/2014   Knee pain, chronic 03/13/2014   Abnormal MRI, knee 03/13/2014   Menorrhagia with irregular cycle 03/13/2014   Iron deficiency anemia 03/12/2014    Past Medical History: Past  Medical History:  Diagnosis Date   Anxiety    Asthma    COPD (chronic obstructive pulmonary disease) ([place])    Degenerative disc disease, lumbar    Diabetes mellitus    Emphysema of lung ([place])    Hypertension    Iron deficiency anemia 03/12/2014   Leg weakness    Low back pain     Past Surgical History: Past Surgical History:  Procedure Laterality Date   ceserean section times 2     DILITATION & CURRETTAGE/HYSTROSCOPY WITH HYDROTHERMAL ABLATION N/A 10/25/2014   Procedure: DILATATION & CURETTAGE/HYSTEROSCOPY WITH HYDROTHERMAL ABLATION;  Surgeon: Shelly Bombard, MD;  Location: [facility];  Service: Gynecology;  Laterality: N/A;   TUBAL LIGATION     tumor removed from leg      Social History: Social History   Tobacco Use   Smoking status: Every Day    Types: Cigarettes   Smokeless tobacco: Never   Tobacco comments:    2 cigarettes/day  Vaping Use   Vaping Use: Never used  Substance Use Topics   Alcohol use: No    Alcohol/week: 0.0 standard drinks   Drug use: No    Family History: Family History  Problem Relation Age of Onset   Hypertension Mother    Heart disease Mother    Diabetes Father     Allergies and Medications: Allergies  Allergen Reactions   Penicillins Anaphylaxis   Flovent Hfa [Fluticasone]     migraine   Gabapentin Swelling   No current facility-administered  medications on file prior to encounter.   Current Outpatient Medications on File Prior to Encounter  Medication Sig Dispense Refill   albuterol (PROVENTIL HFA;VENTOLIN HFA) 108 (90 BASE) MCG/ACT inhaler Inhale 2 puffs into the lungs every 6 (six) hours as needed for wheezing or shortness of breath.      ALPRAZolam (XANAX) 1 MG tablet Take 1 mg by mouth 3 (three) times daily.     amitriptyline (ELAVIL) 25 MG tablet Take 25 mg by mouth at bedtime.     celecoxib (CELEBREX) 200 MG capsule Take 1 capsule (200 mg total) by mouth 2 (two) times daily as needed. (Patient taking differently: Take 200 mg  by mouth 2 (two) times daily as needed for mild pain.) 60 capsule 5   Fluticasone-Salmeterol (ADVAIR) 250-50 MCG/DOSE AEPB Inhale 1 puff into the lungs every 12 (twelve) hours. For shortness of breath     furosemide (LASIX) 40 MG tablet Take 40 mg by mouth daily.     HYDROcodone-acetaminophen (NORCO) 10-325 MG tablet Take 1 tablet by mouth every 6 (six) hours as needed for moderate pain.  0   ibuprofen (ADVIL,MOTRIN) 800 MG tablet Take 1 tablet (800 mg total) by mouth 3 (three) times daily. 30 tablet 5   Ibuprofen-diphenhydrAMINE Cit (ADVIL PM PO) Take 1 tablet by mouth at bedtime as needed (sleep).     insulin aspart protamine-insulin aspart (NOVOLOG 70/30) (70-30) 100 UNIT/ML injection Inject 20 Units into the skin See admin instructions. 4 time daily If blood sugar is over 400     insulin glargine (LANTUS) 100 UNIT/ML injection Inject 15 Units into the skin at bedtime.     ipratropium-albuterol (DUONEB) 0.5-2.5 (3) MG/3ML SOLN Take 3 mLs by nebulization every 4 (four) hours as needed (shortness of breath).     losartan (COZAAR) 50 MG tablet Take 50 mg by mouth daily.     metFORMIN (GLUCOPHAGE) 1000 MG tablet Take 1,000 mg by mouth 2 (two) times daily with a meal.     polyethylene glycol (MIRALAX) packet Take 17 g by mouth daily. (Patient taking differently: Take 17 g by mouth daily as needed for mild constipation.) 14 each 0   Vitamin D, Ergocalciferol, (DRISDOL) 1.25 MG (50000 UNIT) CAPS capsule Take 1 capsule (50,000 Units total) by mouth every 7 (seven) days. (Patient taking differently: Take 50,000 Units by mouth once a week. Saturday or sunday) 8 capsule 0   Cholecalciferol (VITAMIN D-3) 125 MCG (5000 UT) TABS Take 1 tablet by mouth daily. (Patient not taking: Reported on 03/06/2021) 90 tablet 3   Glucosamine Sulfate 1000 MG CAPS Take 1 capsule (1,000 mg total) by mouth 2 (two) times daily. (Patient not taking: Reported on 03/06/2021) 180 each 3   metroNIDAZOLE (FLAGYL) 500 MG tablet Take 1 tablet  (500 mg total) by mouth 2 (two) times daily. (Patient not taking: Reported on 03/04/2021) 14 tablet 2   nabumetone (RELAFEN) 500 MG tablet Take 1 tablet (500 mg total) by mouth 2 (two) times daily as needed. (Patient not taking: Reported on 03/04/2021) 60 tablet 6   oxyCODONE-acetaminophen (PERCOCET) 5-325 MG tablet Take 1 tablet by mouth every 4 (four) hours as needed. (Patient taking differently: Take 1 tablet by mouth every 4 (four) hours as needed for moderate pain.) 12 tablet 0    Objective: BP 138/74    Pulse 92    Temp 99.1 F (37.3 C) (Oral)    Resp 13    Ht 5' (1.524 m)    Wt 87.1 kg  LMP 03/06/2021    SpO2 100%    BMI 37.50 kg/m  Exam: General: Obese, non-toxic and non-ill appearing, laying comfortably in bed Eyes: Pupils PERRLA, EOMI. No conjunctival pallor. ENTM: MMM Neck: Supple, normal ROM Cardiovascular: Regular rate and rhythm, no murmurs appreciated Respiratory: Normal work of breathing on room air. Lungs clear to auscultation. No wheezing or crackles appreciated. Gastrointestinal: Obese, slightly firm and distended, tender to palpation diffusely in lower abdomen. Normal, active bowel sounds heard in all 4 quadrants. MSK: Left ankle/foot wrapped tightly in ACE bandage with notable bruising over anterior ankle and left 5th toe with mild swelling which is exquisitely tender to touch. No ulceration in either feet. Decreased ROM with dorsiflexion and plantarflexion 2/2 pain. Derm: Skin warm and dry Neuro: Awake, alert and oriented. No focal neurologic deficits. Psych: Mood and affect normal.   Labs and Imaging: CBC BMET  Recent Labs  Lab 03/06/21 1617  WBC 10.3  HGB 12.5  HCT 41.9  PLT 356   Recent Labs  Lab 03/06/21 1617  NA 138  K 3.6  CL 98  CO2 30  BUN 10  CREATININE 0.82  GLUCOSE 263*  CALCIUM 9.5      Orvis Brill, DO 03/07/2021, 1:58 AM PGY-1, [place] Intern pager: [phone], text pages welcome   FPTS Upper-Level Resident  Addendum   I have independently interviewed and examined the patient. I have discussed the above with the original author and agree with their documentation. Please see also any attending notes.   Carollee Leitz, MD PGY-3, [facility] 03/07/2021 7:26 AM  FPTS Service pager: [phone] (text pages welcome through [facility])

## 2021-03-06 NOTE — ED Provider Triage Note (Signed)
Emergency Medicine Provider Triage Evaluation Note ? ?Andrea Montgomery , a 46 y.o. female  was evaluated in triage.  Pt was asked to come back today from positive blood cultures.  Cultures obtained when she was in ED 2 days ago.  Active metastatic bone cancer and on chemo.  Was told she may need to be admitted for antibiotic treatment.  Subjective intermittent fevers since discharge.  Still complains of back pain.  Denies chest pain.  Has chronic SOB. ? ?Review of Systems  ?Positive: As above ?Negative: As above ? ?Physical Exam  ?BP (!) 121/104 (BP Location: Left Arm)   Pulse 99   Temp 98.4 ?F (36.9 ?C) (Oral)   Resp 18   Ht 5' (1.524 m)   Wt 87.1 kg   LMP 03/06/2021   SpO2 99%   BMI 37.50 kg/m?  ?Gen:   Awake, no distress   ?Resp:  Normal effort, diffuse wheezing bilaterally ?MSK:   Moves extremities without difficulty  ?Other:  Afebrile and not tachycardic on exam ? ?Medical Decision Making  ?Medically screening exam initiated at 4:05 PM.  Appropriate orders placed.  Andrea Montgomery was informed that the remainder of the evaluation will be completed by another provider, this initial triage assessment does not replace that evaluation, and the importance of remaining in the ED until their evaluation is complete. ? ?Labs and imaging ordered ?  ?Prince Rome, PA-C ?46/27/03 1616 ? ?

## 2021-03-07 ENCOUNTER — Inpatient Hospital Stay (HOSPITAL_COMMUNITY): Payer: Medicaid Other

## 2021-03-07 ENCOUNTER — Encounter (HOSPITAL_COMMUNITY): Payer: Self-pay | Admitting: Student

## 2021-03-07 DIAGNOSIS — R7881 Bacteremia: Principal | ICD-10-CM

## 2021-03-07 DIAGNOSIS — N390 Urinary tract infection, site not specified: Principal | ICD-10-CM

## 2021-03-07 DIAGNOSIS — B952 Enterococcus as the cause of diseases classified elsewhere: Secondary | ICD-10-CM

## 2021-03-07 DIAGNOSIS — K5900 Constipation, unspecified: Secondary | ICD-10-CM

## 2021-03-07 DIAGNOSIS — M79672 Pain in left foot: Secondary | ICD-10-CM

## 2021-03-07 LAB — URINE CULTURE: Culture: >=100000 — AB

## 2021-03-07 LAB — BASIC METABOLIC PANEL
Anion gap: 11 (ref 5–15)
BUN: 9 mg/dL (ref 6–20)
CO2: 29 mmol/L (ref 22–32)
Calcium: 9.1 mg/dL (ref 8.9–10.3)
Chloride: 97 mmol/L — ABNORMAL LOW (ref 98–111)
Creatinine, Ser: 0.76 mg/dL (ref 0.44–1.00)
GFR, Estimated: >60 mL/min (ref >60)
Glucose, Bld: 256 mg/dL — ABNORMAL HIGH (ref 70–99)
Potassium: 3.6 mmol/L (ref 3.5–5.1)
Sodium: 137 mmol/L (ref 135–145)

## 2021-03-07 LAB — CBC
HCT: 39.9 % (ref 36.0–46.0)
Hemoglobin: 12.2 g/dL (ref 12.0–15.0)
MCH: 27.1 pg (ref 26.0–34.0)
MCHC: 30.6 g/dL (ref 30.0–36.0)
MCV: 88.5 fL (ref 80.0–100.0)
Platelets: 312 10*3/uL (ref 150–400)
RBC: 4.51 MIL/uL (ref 3.87–5.11)
RDW: 16.5 % — ABNORMAL HIGH (ref 11.5–15.5)
WBC: 10.1 10*3/uL (ref 4.0–10.5)
nRBC: 0 % (ref 0.0–0.2)

## 2021-03-07 LAB — RAPID URINE DRUG SCREEN, HOSP PERFORMED
Amphetamines: NOT DETECTED
Barbiturates: NOT DETECTED
Benzodiazepines: POSITIVE — AB
Cocaine: NOT DETECTED
Opiates: POSITIVE — AB
Tetrahydrocannabinol: POSITIVE — AB

## 2021-03-07 LAB — GLUCOSE, CAPILLARY
Glucose-Capillary: 266 mg/dL — ABNORMAL HIGH (ref 70–99)
Glucose-Capillary: 280 mg/dL — ABNORMAL HIGH (ref 70–99)
Glucose-Capillary: 151 mg/dL — ABNORMAL HIGH (ref 70–99)
Glucose-Capillary: 163 mg/dL — ABNORMAL HIGH (ref 70–99)
Glucose-Capillary: 222 mg/dL — ABNORMAL HIGH (ref 70–99)

## 2021-03-07 LAB — HEMOGLOBIN A1C
Hgb A1c MFr Bld: 7.6 % — ABNORMAL HIGH (ref 4.8–5.6)
Mean Plasma Glucose: 171.42 mg/dL

## 2021-03-07 LAB — CULTURE, BLOOD (ROUTINE X 2)

## 2021-03-07 MED ORDER — LIVING WELL WITH DIABETES BOOK
Freq: Once | Status: AC
  Filled 2021-03-07: qty 1

## 2021-03-07 MED ORDER — ENSURE ENLIVE PO LIQD
237.0000 mL | Freq: Two times a day (BID) | ORAL | Status: DC
  Administered 2021-03-08: 237 mL via ORAL

## 2021-03-07 MED ORDER — ONDANSETRON HCL 4 MG/2ML IJ SOLN
4.0000 mg | Freq: Once | INTRAMUSCULAR | Status: AC
  Administered 2021-03-07: 4 mg via INTRAVENOUS
  Filled 2021-03-07: qty 2

## 2021-03-07 MED ORDER — ONDANSETRON HCL 4 MG PO TABS
4.0000 mg | ORAL_TABLET | Freq: Two times a day (BID) | ORAL | Status: DC | PRN
  Administered 2021-03-07 – 2021-03-08 (×2): 4 mg via ORAL
  Filled 2021-03-07 (×2): qty 1

## 2021-03-07 MED ORDER — HYDROMORPHONE HCL 1 MG/ML IJ SOLN
1.0000 mg | INTRAMUSCULAR | Status: DC | PRN
  Administered 2021-03-07 – 2021-03-08 (×7): 1 mg via INTRAVENOUS
  Filled 2021-03-07 (×8): qty 1

## 2021-03-07 NOTE — Progress Notes (Signed)
OT Cancellation Note ? ?Patient Details ?Name: Andrea Montgomery ?MRN: [id number] ?DOB: 08-Oct-1975 ? ? ?Cancelled Treatment:    Reason Eval/Treat Not Completed: Other (comment) Noted orders for L post op shoe placed early this AM though not present in room. OT contacted ortho tech around 11:30AM to confirm continued need for post op shoe. Will follow-up for OT eval once delivered to pt room.  ? ?Layla Maw ?03/07/2021, 12:12 PM ?

## 2021-03-07 NOTE — Progress Notes (Signed)
Spoke on the phone with Dr. Lyla Glassing with [facility] regarding imagining results of pts L foot. Pt had previously bumped left foot against her dresser 3 weeks ago and foot found to have notable bruising and painin 5th metatarsal/toe. L foot xray showed soft tissue swelling with erosive changes of 5th middle phalanx that could reflect potential osteomyelitis. L foot MR showed bone marrow edema in 5th middle and distal phalanx concerning for osteomyelitis and no drainable fluid collection. As there is no wound on foot, Dr. Lyla Glassing advised placing pt in hard sole shoe and follow up out patient with his colleague, Dr. Kathaleen Bury.  ?

## 2021-03-07 NOTE — Progress Notes (Signed)
Family Medicine Teaching Service ?Daily Progress Note ?Intern Pager: [phone] ? ?Patient name: Andrea Montgomery Medical record number: [id number] ?Date of birth: 30-Jun-1975 Age: 46 y.o. Gender: female ? ?Primary Care Provider: Vincente Liberty, MD ?Consultants: None ?Code Status: Full ? ?Pt Overview and Major Events to Date:  ?3/3- Admitted ? ?Assessment and Plan: ? ?Concern for bacteremia ?Continues to remain hemodynamically stable. Continue to believe this is likely contaminant and not a true bacteremia. Endorses severe pain and nausea. Will continue to observe and treat empirically until blood cultures result. ?- Vital signs per floor protocol ?- Pending repeat blood cultures, although collected s/p IV Vancomycin ?- S/p IV Vancomycin x1, continue dosing per pharmacy ?- AM labs: CBC, BMP ?- PT/OT eval and treat ? 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 Left foot MRI with soft tissue edema, radiologist will provide final MSK read this morning. ?- S/p IV Vancomycin in ED ?- F/u final MRI read, will tx as appropriate with antibiotics pending results ? ?UTI ?Urine culture + E coli, enterococcus > 100,000. Denies urinary symptoms, but has abdominal pain. No sign of upper urinary tract infection. ?- S/p PO [name] '100mg'$  in ED  ?- Will choose continued antibiotic as appropriate with return of urine culture susceptibilities ?  ?Chronic pain with opioid-induced constipation ?Tearful this morning reporting pain and nausea. Refused Oxycodone overnight and requesting Dilaudid due to nausea. Took PO Tylenol. Will need to meet in the middle with patient with pain medication while inpatient to treat her pain, but do so with appropriate narcotic choice. ?- Dilaudid '1mg'$  q4h PRN ?- Tylenol scheduled q6h ?- Miralax 17g BID ?- Senna 2 tablets daily ?- Dulcolax suppository ?  ?Type 2 Diabetes Mellitus, insulin-dependent ?CBG elevated, 266 this morning. Will try to  optimize glucose control while in hospital. ?- CBG 4 times daily, before meals and at bedtime ?- Continue Metformin '1000mg'$  BID ?- Continue Lantus 15u qhs ?- Resistant SSI ?- Consult to diabetes coordinator ?  ?Reported bone malignancy ?Will try to obtain records from heme/onc regarding potential malignancy, although no records. ? ?Other chronic conditions: ?Anemia: stable, monitor with CBC ?Hypertension: stable, continue home losartan ?Asthma: stable, continue home advair, albuterol PRN ? ?FEN/GI: Regular diet ?PPx: Lovenox ?Dispo:Home in 2-3 days. Barriers include lab results, IV antibiotic treatment.  ? ?Subjective:  ?Tearful and endorsing chronic back pain and nausea. Says Dilaudid helps pain. Was able to get some sleep overnight. ? ?Objective: ?Temp:  [98.4 ?F (36.9 ?C)-99.1 ?F (37.3 ?C)] 99.1 ?F (37.3 ?C) (03/03 1943) ?Pulse Rate:  [92-99] 92 (03/03 2125) ?Resp:  [13-19] 13 (03/03 2125) ?BP: (120-138)/(74-104) 138/74 (03/03 2125) ?SpO2:  [97 %-100 %] 100 % (03/03 2125) ?Weight:  [87.1 kg] 87.1 kg (03/03 1539) ?Physical Exam: ?General: Laying in bed, tearful ?Cardiovascular: RRR ?Respiratory: Normal work of breathing on room air ?Extremities: Warm, dry ? ?Laboratory: ?Recent Labs  ?Lab 03/04/21 ?1557 03/04/21 ?1655 03/06/21 ?1617  ?WBC 18.0*  --  10.3  ?HGB 14.8 17.3* 12.5  ?HCT 47.6* 51.0* 41.9  ?PLT 439*  --  356  ? ?Recent Labs  ?Lab 03/04/21 ?1557 03/04/21 ?1655 03/06/21 ?1617  ?NA 132* 129* 138  ?K 3.9 4.6 3.6  ?CL 96* 98 98  ?CO2 22  --  30  ?BUN 22* 30* 10  ?CREATININE 1.36* 1.20* 0.82  ?CALCIUM 8.7*  --  9.5  ?PROT 6.7  --  6.6  ?BILITOT 0.4  --  <0.1*  ?ALKPHOS  51  --  64  ?ALT 21  --  26  ?AST 26  --  44*  ?GLUCOSE 316* 312* 263*  ? ? ?Imaging/Diagnostic Tests: ?DG Chest 2 View ? ?Result Date: 03/06/2021 ?CLINICAL DATA:  Positive blood cultures, intermittent fevers EXAM: CHEST - 2 VIEW COMPARISON:  05/12/2011 FINDINGS: The heart size and mediastinal contours are within normal limits. Both lungs are  clear. The visualized skeletal structures are unremarkable. IMPRESSION: No active cardiopulmonary disease. Electronically Signed   By: Randa Ngo M.D.   On: 03/06/2021 16:56  ? ?DG Foot Complete Left ? ?Result Date: 03/06/2021 ?CLINICAL DATA:  Left foot pain EXAM: LEFT FOOT - COMPLETE 3+ VIEW COMPARISON:  None. FINDINGS: Frontal, oblique, lateral views of the left foot are obtained. Erosive changes are seen involving the distal aspect of the fifth middle phalanx. No other acute or destructive bony lesions are identified. Joint spaces are well preserved. Mild soft tissue swelling of the forefoot. IMPRESSION: 1. Forefoot soft tissue swelling. Erosive changes of the fifth middle phalanx could reflect underlying osteomyelitis. Electronically Signed   By: Randa Ngo M.D.   On: 03/06/2021 23:45   ? ? ?Orvis Brill, DO ?03/07/2021, 2:35 AM ?PGY-1, [place] ?[place] Intern pager: [phone], text pages welcome  ?

## 2021-03-07 NOTE — Progress Notes (Signed)
New Admission Note:  ? ?Arrival Method: Stretcher ?Mental Orientation: Ax4 ?Telemetry: none ?Assessment: Completed ?Skin: see flow sheet ?IV: NSL ?Pain: see mar ?Tubes: none ?Safety Measures: Safety Fall Prevention Plan has been discussed ?Admission: Completed ?[facility] Orientation: Patient has been orientated to the room, unit and staff.  ?Family: Fiance at bedside ? ?Orders have been reviewed and implemented. Will continue to monitor the patient. Call light has been placed within reach and bed alarm has been activated.  ? ?Rockie Neighbours BSN, RN ?Phone number: [phone]  ?

## 2021-03-07 NOTE — ED Notes (Signed)
Received call from admitting doctor who states she will send someone down to talk to pt. ?

## 2021-03-07 NOTE — Progress Notes (Signed)
Inpatient Diabetes Program Recommendations ? ?AACE/ADA: New Consensus Statement on Inpatient Glycemic Control (2015) ? ?Target Ranges:  Prepandial:   less than 140 mg/dL ?     Peak postprandial:   less than 180 mg/dL (1-2 hours) ?     Critically ill patients:  140 - 180 mg/dL  ? ?Lab Results  ?Component Value Date  ? GLUCAP 280 (H) 03/07/2021  ? HGBA1C 7.6 (H) 03/07/2021  ? ? ?Review of Glycemic Control ? ?Diabetes history: DM2 ?Outpatient Diabetes medications: Lantus 15 QHS, 70/30 20 units qid if CBG > 400, metformin 1000 mg BID ?Current orders for Inpatient glycemic control: Semglee 15 units QHS, Novolog 0-20 units TID, metformin 1000 mg BID ? ?HgbA1C 7.6% ? ?Inpatient Diabetes Program Recommendations:   ? ?Consider adding meal coverage - Novolog 5 units TID with meals if pt eats > 50% ? ?Spoke with pt on phone regarding her diabetes control at home. Discussed HgbA1C of 7.6%, average blood sugar of 171 mg/dL, and goal of 7%. Pt states she monitors her blood sugars 3-4x/day. No issues with getting her insulin or supplies. Discussed diet, pt said she does drink juice and we discussed other low CHO options. Weight loss is necessary to assist with glycemic control. Pt was very sleepy throughout conversation. ? ?Will continue to follow glucose trends. ? ?Thank you. ?Lorenda Peck, RD, LDN, CDE ?Inpatient Diabetes Coordinator ?[phone]  ? ? ? ? ?

## 2021-03-07 NOTE — Progress Notes (Signed)
Initial Nutrition Assessment ? ?DOCUMENTATION CODES:  ? ?Not applicable ? ?INTERVENTION:  ? ?Add MVI with Minerals ? ?If continues without BM, recommend more aggressive bowel regimen as constipation may be contributing to N/V ? ?Ensure Enlive po BID, each supplement provides 350 kcal and 20 grams of protein. ? ? ?NUTRITION DIAGNOSIS:  ? ?Inadequate oral intake related to nausea, vomiting as evidenced by per patient/family report. ? ?GOAL:  ? ?Patient will meet greater than or equal to 90% of their needs ? ?MONITOR:  ? ?PO intake, Supplement acceptance, Labs, Weight trends ? ?REASON FOR ASSESSMENT:  ? ?Malnutrition Screening Tool ?  ? ?ASSESSMENT:  ? ?46 yo female admitted with possible bacteremia, possible left foot 5th toe oesteomyelitis. PMH includes chronic pain on opiods, DM, HTN, anemia requiring IV iron, tobacco use ? ?Per MD note, Pt states she has bone cancer and is receiving treatment at [facility] but no documentation of this.  ? ?Pt reports 14 pound wt loss in 2 weeks; reports N/V x 1 week, can only eat a few bites of food and throws up.  ? ?+constipation; last BM 2/26. Pt on chronic pain regimen at home; started on bowel regimen ? ?No recorded po intake.  ? ?Per weight encounters; no wt loss; current wt 103 kg.  ?Wt Readings from Last 10 Encounters:  ?03/07/21 103 kg  ?03/04/21 87.1 kg  ?01/27/21 101.2 kg  ?05/01/20 104.6 kg  ?01/27/18 99.8 kg  ?08/23/17 103.4 kg  ?07/05/17 105.2 kg  ?05/23/17 100.2 kg  ?04/29/17 103 kg  ?05/05/16 105.4 kg  ? ? ? ?Diet Order:   ?Diet Order   ? ?       ?  Diet Carb Modified Fluid consistency: Thin; Room service appropriate? Yes  Diet effective now       ?  ? ?  ?  ? ?  ? ? ?EDUCATION NEEDS:  ? ?Not appropriate for education at this time ? ?Skin:  Skin Assessment: Reviewed RN Assessment ? ?Last BM:  PTA; 2/26 ? ?Height:  ? ?Ht Readings from Last 1 Encounters:  ?03/06/21 5' (1.524 m)  ? ? ?Weight:  ? ?Wt Readings from Last 1 Encounters:  ?03/07/21 103 kg   ? ? ? ? ?BMI:  Body mass index is 44.35 kg/m?. ? ?Estimated Nutritional Needs:  ? ?Kcal:  1800-2000 kcals ? ?Protein:  100-115 g ? ?Fluid:  >/= 1.5 L ? ? ?Kerman Passey MS, RDN, LDN, CNSC ?Registered Dietitian III ?Clinical Nutrition ?RD Pager and On-Call Pager Number Located in [place]  ? ?

## 2021-03-07 NOTE — Evaluation (Signed)
Physical Therapy Evaluation ?Patient Details ?Name: Andrea Montgomery ?MRN: [id number] ?DOB: Feb 16, 1975 ?Today's Date: 03/07/2021 ? ?History of Present Illness ? 46 y/o female presented to ED on 03/06/21 for abnormal blood work due to positive blood culture for staphylococcus spp and UTI. 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 PMH: chronic pain on high dose opioids, T2DM, reported bone cancer (though no documented history), HTN, anemia, tobacco use, asthma. ?  ?Clinical Impression ? Patient admitted with the above. Patient presents with weakness, decreased activity tolerance, impaired balance, and pain. Educated patient on use of post op shoe for support and stability, patient verbalized understanding. Patient requires supervision for mobility with use of SPC for stability but demos balance deficits increasing her risk for falls. Patient will benefit from skilled PT services during acute stay to address listed deficits. Recommend HHPT at discharge to maximize functional mobility and strengthening of L LE.    ?   ? ?Recommendations for follow up therapy are one component of a multi-disciplinary discharge planning process, led by the attending physician.  Recommendations may be updated based on patient status, additional functional criteria and insurance authorization. ? ?Follow Up Recommendations Home health PT ? ?  ?Assistance Recommended at Discharge PRN  ?Patient can return home with the following ? Assistance with cooking/housework;Help with stairs or ramp for entrance;Assist for transportation;A little help with bathing/dressing/bathroom ? ?  ?Equipment Recommendations None recommended by PT  ?Recommendations for Other Services ?    ?  ?Functional Status Assessment Patient has had a recent decline in their functional status and demonstrates the ability to make significant improvements in function in a reasonable and predictable amount of time.  ? ?  ?Precautions / Restrictions  Precautions ?Precautions: Fall ?Precaution Comments: post op shoe on L per MD orders ?Restrictions ?Weight Bearing Restrictions: Yes ?LLE Weight Bearing: Weight bearing as tolerated  ? ?  ? ?Mobility ? Bed Mobility ?Overal bed mobility: Modified Independent ?  ?  ?  ?  ?  ?  ?  ?  ? ?Transfers ?Overall transfer level: Modified independent ?Equipment used: Straight cane ?  ?  ?  ?  ?  ?  ?  ?  ?  ? ?Ambulation/Gait ?Ambulation/Gait assistance: Supervision ?Gait Distance (Feet): 75 Feet ?Assistive device: Straight cane ?Gait Pattern/deviations: Step-to pattern, Decreased stance time - left, Decreased stride length, Decreased weight shift to left, Antalgic ?Gait velocity: decreased ?  ?  ?General Gait Details: antalgic gait pattern. Use of SPC for support but demos balance deficits requiring supervision for safety ? ?Stairs ?  ?  ?  ?  ?  ? ?Wheelchair Mobility ?  ? ?Modified Rankin (Stroke Patients Only) ?  ? ?  ? ?Balance Overall balance assessment: Needs assistance ?Sitting-balance support: No upper extremity supported, Feet supported ?Sitting balance-Leahy Scale: Fair ?  ?  ?Standing balance support: Single extremity supported, Reliant on assistive device for balance, During functional activity ?Standing balance-Leahy Scale: Poor ?Standing balance comment: reliant on UE support of cane for mobility ?  ?  ?  ?  ?  ?  ?  ?  ?  ?  ?  ?   ? ? ? ?Pertinent Vitals/Pain Pain Assessment ?Pain Assessment: Faces ?Faces Pain Scale: Hurts even more ?Pain Location: L foot after mobility ?Pain Descriptors / Indicators: Discomfort, Grimacing, Guarding ?Pain Intervention(s): Monitored during session, Repositioned  ? ? ?Home Living Family/patient expects to be discharged to:: Private residence ?Living Arrangements: Children;Spouse/significant other ?Available Help at  Discharge: Family ?Type of Home: House ?Home Access: Level entry ?  ?  ?  ?Home Layout: One level ?Home Equipment: Cane - single point;BSC/3in1 ?   ?  ?Prior Function  Prior Level of Function : Independent/Modified Independent;Driving ?  ?  ?  ?  ?  ?  ?Mobility Comments: using cane for stability due to recent foot pain ?  ?  ? ? ?Hand Dominance  ?   ? ?  ?Extremity/Trunk Assessment  ? Upper Extremity Assessment ?Upper Extremity Assessment: Defer to OT evaluation ?  ? ?Lower Extremity Assessment ?Lower Extremity Assessment: Generalized weakness (L foot pain) ?  ? ?Cervical / Trunk Assessment ?Cervical / Trunk Assessment: Normal  ?Communication  ? Communication: No difficulties  ?Cognition Arousal/Alertness: Awake/alert ?Behavior During Therapy: [facility] for tasks assessed/performed ?Overall Cognitive Status: Within Functional Limits for tasks assessed ?  ?  ?  ?  ?  ?  ?  ?  ?  ?  ?  ?  ?  ?  ?  ?  ?  ?  ?  ? ?  ?General Comments   ? ?  ?Exercises    ? ?Assessment/Plan  ?  ?PT Assessment Patient needs continued PT services  ?PT Problem List Decreased strength;Decreased range of motion;Decreased activity tolerance;Decreased balance;Decreased mobility;Pain ? ?   ?  ?PT Treatment Interventions DME instruction;Gait training;Functional mobility training;Therapeutic activities;Balance training;Therapeutic exercise;Patient/family education   ? ?PT Goals (Current goals can be found in the Care Plan section)  ?Acute Rehab PT Goals ?Patient Stated Goal: to reduce pain and move better ?PT Goal Formulation: With patient ?Time For Goal Achievement: 03/21/21 ?Potential to Achieve Goals: Good ? ?  ?Frequency Min 3X/week ?  ? ? ?Co-evaluation   ?  ?  ?  ?  ? ? ?  ?AM-PAC PT "6 Clicks" Mobility  ?Outcome Measure Help needed turning from your back to your side while in a flat bed without using bedrails?: None ?Help needed moving from lying on your back to sitting on the side of a flat bed without using bedrails?: None ?Help needed moving to and from a bed to a chair (including a wheelchair)?: A Little ?Help needed standing up from a chair using your arms (e.g., wheelchair or bedside chair)?: A  Little ?Help needed to walk in hospital room?: A Little ?Help needed climbing 3-5 steps with a railing? : A Little ?6 Click Score: 20 ? ?  ?End of Session Equipment Utilized During Treatment: Other (comment) (post op shoe) ?Activity Tolerance: Patient tolerated treatment well ?Patient left: in bed;with call bell/phone within reach;with family/visitor present ?Nurse Communication: Mobility status;Patient requests pain meds ?PT Visit Diagnosis: Unsteadiness on feet (R26.81);Muscle weakness (generalized) (M62.81);Pain ?Pain - Right/Left: Left ?Pain - part of body: Ankle and joints of foot ?  ? ?Time: 1421-1440 ?PT Time Calculation (min) (ACUTE ONLY): 19 min ? ? ?Charges:   PT Evaluation ?$PT Eval Low Complexity: 1 Low ?  ?  ?   ? ? ?Riniyah Speich A. Gilford Rile, PT, DPT ?Acute Rehabilitation Services ?Pager [phone] ?Office [phone] ? ? ?Yenesis Even A Sheyli Horwitz ?03/07/2021, 4:21 PM ? ?

## 2021-03-07 NOTE — Progress Notes (Signed)
Patient complaining of nausea and unable to keep down pills. Requesting IV medication for pain in back and foot.   Rounded with primary RN.    Appreciated nightly round. ? ?Today's Vitals  ? 03/06/21 2125 03/06/21 2242 03/07/21 6834 03/07/21 0336  ?BP: 138/74  (!) 146/84   ?Pulse: 92  84   ?Resp: 13  20   ?Temp:   98.3 ?F (36.8 ?C)   ?TempSrc:   Oral   ?SpO2: 100%  100%   ?Weight:   103 kg   ?Height:      ?PainSc:  3   7   ? ?Zofran 4 mg IV ?Dilaudid 1 mg q4h prn ?D/C Oxy ?Switch to po medication when tolerating po ? ?Carollee Leitz, MD ?Family Medicine Residency    ?

## 2021-03-07 NOTE — Progress Notes (Signed)
Orthopedic Tech Progress Note ?Patient Details:  ?[name] ?Nov 11, 1975 ?[id number] ? ?Adjusted post op shoe to LLE and placed it at bedside for when the pt is OOB/ambulating.  ? ?Ortho Devices ?Type of Ortho Device: Postop shoe/boot ?Ortho Device/Splint Location: for LLE, at bedside ?Ortho Device/Splint Interventions: Ordered, Application, Adjustment, Removal ?  ?Post Interventions ?Patient Tolerated: Well ?Instructions Provided: Care of device, Adjustment of device ? ?Neenah Canter Jeri Modena ?03/07/2021, 1:33 PM ? ?

## 2021-03-07 NOTE — Evaluation (Signed)
Occupational Therapy Evaluation ?Patient Details ?Name: Andrea Montgomery ?MRN: [id number] ?DOB: [birth year] ?Today's Date: 03/07/2021 ? ? ?History of Present Illness 46 y/o female presented to ED on 03/06/21 for abnormal blood work due to positive blood culture for staphylococcus spp and UTI. 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 PMH: chronic pain on high dose opioids, T2DM, reported bone cancer (though no documented history), HTN, anemia, tobacco use, asthma.  ? ?Clinical Impression ?  ?Pt admitted for concerns listed above. PTA pt reports that she was independent with all ADL's and IADL's, including working and caring for her children. At this time, pt is limited by increased pain in BLE, especially her LLE. Despite pain, pt is able to demonstrate independence with BADL's and short distance functional mobility to complete toileting. OT reviewed compensatory strategies such as sitting to complete bathing, dressing, and grooming. Pt has no further OT needs and acute OT will sign off.   ?   ? ?Recommendations for follow up therapy are one component of a multi-disciplinary discharge planning process, led by the attending physician.  Recommendations may be updated based on patient status, additional functional criteria and insurance authorization.  ? ?Follow Up Recommendations ? No OT follow up  ?  ?Assistance Recommended at Discharge PRN  ?Patient can return home with the following A little help with walking and/or transfers;A little help with bathing/dressing/bathroom ? ?  ?Functional Status Assessment ? Patient has had a recent decline in their functional status and demonstrates the ability to make significant improvements in function in a reasonable and predictable amount of time.  ?Equipment Recommendations ? None recommended by OT  ?  ?Recommendations for Other Services   ? ? ?  ?Precautions / Restrictions Precautions ?Precautions: Fall ?Precaution Comments: post op shoe on L per MD  orders ?Restrictions ?Weight Bearing Restrictions: Yes ?LLE Weight Bearing: Weight bearing as tolerated  ? ?  ? ?Mobility Bed Mobility ?Overal bed mobility: Modified Independent ?  ?  ?  ?  ?  ?  ?  ?  ? ?Transfers ?Overall transfer level: Modified independent ?Equipment used: Straight cane ?  ?  ?  ?  ?  ?  ?  ?  ?  ? ?  ?Balance Overall balance assessment: Needs assistance ?Sitting-balance support: No upper extremity supported, Feet supported ?Sitting balance-Leahy Scale: Good ?  ?  ?Standing balance support: Single extremity supported, Reliant on assistive device for balance, During functional activity ?Standing balance-Leahy Scale: Fair ?Standing balance comment: reliant on UE support of cane for mobility ?  ?  ?  ?  ?  ?  ?  ?  ?  ?  ?  ?   ? ?ADL either performed or assessed with clinical judgement  ? ?ADL Overall ADL's : Modified independent ?  ?  ?  ?  ?  ?  ?  ?  ?  ?  ?  ?  ?  ?  ?  ?  ?  ?  ?  ?General ADL Comments: Pt demonstrated ability to doff/don shirt, pants, socks, and post op shoe with no assist safely, as well as complete toileting in the bathroom independently.  ? ? ? ?Vision Baseline Vision/History: 0 No visual deficits ?Ability to See in Adequate Light: 0 Adequate ?Patient Visual Report: No change from baseline ?Vision Assessment?: No apparent visual deficits  ?   ?Perception   ?  ?Praxis   ?  ? ?Pertinent Vitals/Pain Pain Assessment ?Pain Assessment: Faces ?  Faces Pain Scale: Hurts even more ?Pain Location: L foot after mobility ?Pain Descriptors / Indicators: Discomfort, Grimacing, Guarding ?Pain Intervention(s): Monitored during session, Limited activity within patient's tolerance  ? ? ? ?Hand Dominance Right ?  ?Extremity/Trunk Assessment Upper Extremity Assessment ?Upper Extremity Assessment: Overall WFL for tasks assessed ?  ?Lower Extremity Assessment ?Lower Extremity Assessment: Defer to PT evaluation ?  ?Cervical / Trunk Assessment ?Cervical / Trunk Assessment: Normal ?  ?Communication  Communication ?Communication: No difficulties ?  ?Cognition Arousal/Alertness: Awake/alert ?Behavior During Therapy: [facility] for tasks assessed/performed ?Overall Cognitive Status: Within Functional Limits for tasks assessed ?  ?  ?  ?  ?  ?  ?  ?  ?  ?  ?  ?  ?  ?  ?  ?  ?  ?  ?  ?General Comments  VSS on RA, LLE appears swollen ? ?  ?Exercises   ?  ?Shoulder Instructions    ? ? ?Home Living Family/patient expects to be discharged to:: Private residence ?Living Arrangements: Children;Spouse/significant other ?Available Help at Discharge: Family ?Type of Home: House ?Home Access: Level entry ?  ?  ?Home Layout: One level ?  ?  ?Bathroom Shower/Tub: Tub/shower unit ?  ?Bathroom Toilet: Standard ?  ?  ?Home Equipment: Cane - single point;BSC/3in1 ?  ?  ?  ? ?  ?Prior Functioning/Environment Prior Level of Function : Independent/Modified Independent;Driving ?  ?  ?  ?  ?  ?  ?Mobility Comments: using cane for stability due to recent foot pain ?  ?  ? ?  ?  ?OT Problem List: Decreased activity tolerance;Impaired balance (sitting and/or standing);Pain ?  ?   ?OT Treatment/Interventions:    ?  ?OT Goals(Current goals can be found in the care plan section) Acute Rehab OT Goals ?Patient Stated Goal: To lessen pain ?OT Goal Formulation: With patient ?Time For Goal Achievement: 03/07/21 ?Potential to Achieve Goals: Good  ?OT Frequency:   ?  ? ?Co-evaluation   ?  ?  ?  ?  ? ?  ?AM-PAC OT "6 Clicks" Daily Activity     ?Outcome Measure Help from another person eating meals?: None ?Help from another person taking care of personal grooming?: None ?Help from another person toileting, which includes using toliet, bedpan, or urinal?: None ?Help from another person bathing (including washing, rinsing, drying)?: None ?Help from another person to put on and taking off regular upper body clothing?: None ?Help from another person to put on and taking off regular lower body clothing?: None ?6 Click Score: 24 ?  ?End of Session Equipment  Utilized During Treatment: Other (comment) (Cane and L post op shoe) ?Nurse Communication: Mobility status ? ?Activity Tolerance: Patient tolerated treatment well ?Patient left: in bed;with call bell/phone within reach;with family/visitor present ? ?OT Visit Diagnosis: Unsteadiness on feet (R26.81);Muscle weakness (generalized) (M62.81);Pain ?Pain - Right/Left: Left ?Pain - part of body: Ankle and joints of foot  ?              ?Time: 2563-8937 ?OT Time Calculation (min): 15 min ?Charges:  OT General Charges ?$OT Visit: 1 Visit ?OT Evaluation ?$OT Eval Moderate Complexity: 1 Mod ? ?Brain Honeycutt H., OTR/L ?Acute Rehabilitation ? ?Zeda Gangwer Elane Yolanda Bonine ?03/07/2021, 6:59 PM ?

## 2021-03-08 ENCOUNTER — Telehealth (HOSPITAL_BASED_OUTPATIENT_CLINIC_OR_DEPARTMENT_OTHER): Payer: Self-pay | Admitting: *Deleted

## 2021-03-08 LAB — GLUCOSE, CAPILLARY
Glucose-Capillary: 221 mg/dL — ABNORMAL HIGH (ref 70–99)
Glucose-Capillary: 183 mg/dL — ABNORMAL HIGH (ref 70–99)

## 2021-03-08 LAB — URINE CULTURE: Culture: 80000 — AB

## 2021-03-08 MED ORDER — NITROFURANTOIN MONOHYD MACRO 100 MG PO CAPS
100.0000 mg | ORAL_CAPSULE | Freq: Two times a day (BID) | ORAL | Status: DC
  Administered 2021-03-08: 100 mg via ORAL
  Filled 2021-03-08 (×2): qty 1

## 2021-03-08 MED ORDER — NITROFURANTOIN MONOHYD MACRO 100 MG PO CAPS: 100.0000 mg | ORAL_CAPSULE | Freq: Two times a day (BID) | ORAL | 0 refills | Status: AC

## 2021-03-08 MED ORDER — HYDROCODONE-ACETAMINOPHEN 10-325 MG PO TABS
1.0000 | ORAL_TABLET | Freq: Four times a day (QID) | ORAL | Status: DC | PRN
  Filled 2021-03-08: qty 1

## 2021-03-08 NOTE — Discharge Instructions (Signed)
Dear [name], ? ?Please pick up your [name] prescription for your urinary infection. Please also wear the boot to protect your left foot. We will call you if the blood cultures grows any bacteria.  ?

## 2021-03-08 NOTE — Progress Notes (Signed)
FPTS Interim Progress Note ? ?On review of PDMP, patient is getting norco 10-325 mg from multiple providers: Vanice Sarah, a PA with a [facility], on 02/19/21 #165 pills for a 28 day supply, as well as morphine sulfate 15 mg tablet, #72 pills; she received morphine sulfate 15 mg tablet, #120 for a 30 day supply on 12/05/20 and 01/02/21 from Dr. Delton See, a family medicine PCP at [facility]. She also receives a benzodiazepine, alprazolam 1 mg tablet, #90, 30 day supply, most recently on 02/20/21, from her reported PCP, Dr. Katherine Roan.  ? ?Patient reports that she has "bone cancer" and that she receives chemotherapy twice a week at the [facility] outpatient cancer center. However, there are no records of this in Epic.  ? ?Patient was receiving 1 mg hydromorphone IV q4h prn for pain. She does not have an indication for IV pain medicine, so hydromorphone rx was discontinued today. Her home medication, norco 10-325 mg q6h prn was ordered. She says that she cannot swallow the size of this pill, however, the norco 10-'325mg'$  is her home medication. I confirmed with pharmacy that the norco 10-325 mg can be crushed as she reports trouble swallowing these pills here in the hospital. She does not report having trouble taking her medication at home. Patient is able to eat a regular diet without issue, there is no history or complaint of dysphagia. She is welcome to take her home medicine crushed, whole, or to not receive any pain medication as there is no indication for her to receive IV pain meds. Patient reported to her nurse and Dr. Lurline Hare, PGY-1, that she wished to leave AMA if she would not get IV pain medicine, which is her right to choose. Dr. Andria Frames, attending, notified. ? ?We will send her with macrobid 100 mg BID x5 day rx for UTI and will call her with results of blood culture when available. ? ?Gladys Damme, MD ?03/08/2021, 2:17 PM ?PGY-3, [place] ?Service pager [phone] ? ?

## 2021-03-08 NOTE — Hospital Course (Addendum)
Staphylococcus Bacteremia Concerns ?Patient was initially admitted due to 1 of 2 blood cultures growing positive staph hominis blood cultures when patient was in the ED on 3/1.  Since hospitalization, patient's been hemodynamically stable and afebrile.  WBC has down trended from 18.0 from 3/1 ED visit to 10.1.  Vancomycin was initially started due to concerns of Staphylococcus bacteremia.  However, given no acute symptoms and stable vital signs, vancomycin was discontinued.  Patient's repeat blood cultures showed no growth after 48 hours.  Suspect that initial blood cultures obtained in 3/1 ED visit was contaminant. ? ?UTI ?Urine culture was positive for E. coli and E faecalis >100,000.  Reports frequency but no dysuria or urgency.  Patient was started on [name] and will be discharging with additional 5-day prescription. ? ?Chronic pain with opioid-induced constipation ?Patient reports significant back pain which is why she has multiple opiate prescriptions.  Per PDMP, patient receives Norco 10-325 by PCP Dr. Katherine Roan as well as morphine 15 mg from pain specialist Dr. Carolynn Sayers.  Patient was initially started on Dilaudid every 4 hours as needed due to persistent complaint of back pain; however, patient was resumed back on home Norco as it did not appear that back pain was acutely different.  UDS was positive for opiates, THC, and benzodiazepines.  Patient was started on MiraLAX 17 g twice daily, Dulcolax suppository and senna 2 tablets daily to aid with likely opiate-induced constipation.  ? ?Type 2 Diabetes Mellitus, insulin-dependent ?Patient's A1c 7.6. Patient reports taking medications as prescribed. Patient was continued on home medication regimen of Metformin 1000 mg bid, Novolog s/s, Lantus qhs. An additional 5 units of Novolog was added for mealtime coverage.  ? ?Reported bone malignancy ?There is no documentation anywhere in EMR or care everywhere that indicates that patient has ever received the  diagnosis of bone malignancy. Patient reports that she regularly goes to [facility] treatment center for "bone cancer" for which she receives chemotherapy twice a week. There is no documentation of this. The last time she saw Dr. Alvy Bimler was for iron deficiency anemia and this was back in 2018. She has received multiple doses of IV Iron in 2016 but there is never a formal diagnosis of cancer. It is unclear whether this is due to medical literacy problems or some other problem, but we have been unable to reconcile what the patient reports and the lack of documentation.  ? ?Issues to address: ?Medication reconciliation. Patient needs to have medications reconciled so patient is on a proper pain regiment.  ?Assess whether patient continues to have urinary frequency ?

## 2021-03-08 NOTE — Discharge Summary (Addendum)
Family Medicine Teaching Service ?AMA Summary ? ?Patient name: Andrea Montgomery Medical record number: [id number] ?Date of birth: 1975/12/26 Age: 46 y.o. Gender: female ?Date of Admission: 03/06/2021  Date of Discharge: 03/08/21 ?Admitting Physician: Orvis Brill, DO ? ?Primary Care Provider: Vincente Liberty, MD ?Consultants: none ? ?Indication for Hospitalization: concern for staphylococcus bacteremia ? 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 ? ?Disposition: Home ? ?Discharge Condition: Left AMA ? ?Discharge Exam: Unable to perform due to leaving AMA ? ?Brief Hospital Course:  ?Staphylococcus Bacteremia Concerns ?Patient was initially admitted due to 1 of 2 blood cultures growing positive staph hominis blood cultures when patient was in the ED on 3/1.  Since hospitalization, patient's been hemodynamically stable and afebrile.  WBC has down trended from 18.0 from 3/1 ED visit to 10.1.  Vancomycin was initially started due to concerns of Staphylococcus bacteremia.  However, given no acute symptoms and stable vital signs, vancomycin was discontinued.  Patient's repeat blood cultures showed no growth after 48 hours.  Suspect that initial blood cultures obtained in 3/1 ED visit was contaminant. ? ?UTI ?Urine culture was positive for E. coli and E faecalis >100,000.  Reports frequency but no dysuria or urgency.  Patient was started on [name] and will be discharging with additional 5-day prescription. ? ?Chronic pain with opioid-induced constipation ?Patient reports significant back pain which is why she has multiple opiate prescriptions.  Per PDMP, patient receives Norco 10-325 by PCP Dr. Katherine Roan as well as morphine 15 mg from pain specialist Dr. Carolynn Sayers.  Patient was  initially started on Dilaudid every 4 hours as needed due to persistent complaint of back pain; however, patient was resumed back on home Norco as it did not appear that back pain was acutely different.  UDS was positive for opiates, THC, and benzodiazepines.  Patient was started on MiraLAX 17 g twice daily, Dulcolax suppository and senna 2 tablets daily to aid with likely opiate-induced constipation.  ? ?Type 2 Diabetes Mellitus, insulin-dependent ?Patient's A1c 7.6. Patient reports taking medications as prescribed. Patient was continued on home medication regimen of Metformin 1000 mg bid, Novolog s/s, Lantus qhs. An additional 5 units of Novolog was added for mealtime coverage.  ? ?Reported bone malignancy ?There is no documentation anywhere in EMR or care everywhere that indicates that patient has ever received the diagnosis of bone malignancy. Patient reports that she regularly goes to [facility] treatment center for "bone cancer" for which she receives chemotherapy twice a week. There is no documentation of this. The last time she saw Dr. Alvy Bimler was for iron deficiency anemia and this was back in 2018. She has received multiple doses of IV Iron in 2016 but there is never a formal diagnosis of cancer. It is unclear whether this is due to medical literacy problems or some other problem, but we have been unable to reconcile what the patient reports and the lack of documentation.  ? ?Issues to address: ?Medication reconciliation. Patient needs to have medications reconciled so patient is on a proper pain regiment.  ?Assess whether patient continues to have urinary frequency ? ? ?Significant Procedures: none ? ?Significant Labs and Imaging:  ?Recent Labs  ?Lab 03/04/21 ?1557 03/04/21 ?1655 03/06/21 ?1617 03/07/21 ?0208  ?WBC 18.0*  --  10.3 10.1  ?HGB 14.8 17.3* 12.5 12.2  ?HCT 47.6* 51.0* 41.9  39.9  ?PLT 439*  --  356 312  ? ?Recent Labs  ?Lab 03/04/21 ?1557 03/04/21 ?1655 03/06/21 ?1617 03/07/21 ?0208   ?NA 132* 129* 138 137  ?K 3.9 4.6 3.6 3.6  ?CL 96* 98 98 97*  ?CO2 22  --  30 29  ?GLUCOSE 316* 312* 263* 256*  ?BUN 22* 30* 10 9  ?CREATININE 1.36* 1.20* 0.82 0.76  ?CALCIUM 8.7*  --  9.5 9.1  ?ALKPHOS 51  --  64  --   ?AST 26  --  44*  --   ?ALT 21  --  26  --   ?ALBUMIN 3.4*  --  3.5  --   ? ? ? ? ?Results/Tests Pending at Time of Discharge: Blood Cultures x2 ? ?Discharge Medications:  ?Allergies as of 03/08/2021   ? ?   Reactions  ? Penicillins Anaphylaxis  ? Flovent Hfa [fluticasone]   ? migraine  ? Gabapentin Swelling  ? ?  ? ?  ?Medication List  ?  ? ?STOP taking these medications   ? ?nabumetone 500 MG tablet ?Commonly known as: RELAFEN ?  ? ?  ? ?TAKE these medications   ? ?ADVIL PM PO ?Take 1 tablet by mouth at bedtime as needed (sleep). ?  ?albuterol 108 (90 Base) MCG/ACT inhaler ?Commonly known as: VENTOLIN HFA ?Inhale 2 puffs into the lungs every 6 (six) hours as needed for wheezing or shortness of breath. ?  ?ALPRAZolam 1 MG tablet ?Commonly known as: Duanne Moron ?Take 1 mg by mouth 3 (three) times daily. ?  ?amitriptyline 25 MG tablet ?Commonly known as: ELAVIL ?Take 25 mg by mouth at bedtime. ?  ?celecoxib 200 MG capsule ?Commonly known as: CeleBREX ?Take 1 capsule (200 mg total) by mouth 2 (two) times daily as needed. ?What changed: reasons to take this ?  ?Fluticasone-Salmeterol 250-50 MCG/DOSE Aepb ?Commonly known as: ADVAIR ?Inhale 1 puff into the lungs every 12 (twelve) hours. For shortness of breath ?  ?furosemide 40 MG tablet ?Commonly known as: LASIX ?Take 40 mg by mouth daily. ?  ?Glucosamine Sulfate 1000 MG Caps ?Take 1 capsule (1,000 mg total) by mouth 2 (two) times daily. ?  ?HYDROcodone-acetaminophen 10-325 MG tablet ?Commonly known as: NORCO ?Take 1 tablet by mouth every 6 (six) hours as needed for moderate pain. ?  ?ibuprofen 800 MG tablet ?Commonly known as: ADVIL ?Take 1 tablet (800 mg total) by mouth 3 (three) times daily. ?  ?insulin aspart protamine- aspart (70-30) 100 UNIT/ML  injection ?Commonly known as: NOVOLOG MIX 70/30 ?Inject 20 Units into the skin See admin instructions. 4 time daily If blood sugar is over 400 ?  ?insulin glargine 100 UNIT/ML injection ?Commonly known as: LANTUS ?Inject 15 Units into the skin at bedtime. ?  ?ipratropium-albuterol 0.5-2.5 (3) MG/3ML Soln ?Commonly known as: DUONEB ?Take 3 mLs by nebulization every 4 (four) hours as needed (shortness of breath). ?  ?losartan 50 MG tablet ?Commonly known as: COZAAR ?Take 50 mg by mouth daily. ?  ?metFORMIN 1000 MG tablet ?Commonly known as: GLUCOPHAGE ?Take 1,000 mg by mouth 2 (two) times daily with a meal. ?  ?metroNIDAZOLE 500 MG tablet ?Commonly known as: FLAGYL ?Take 1 tablet (500 mg total) by mouth 2 (two) times daily. ?  ?nitrofurantoin (macrocrystal-monohydrate) 100 MG capsule ?Commonly known as: [name] ?Take 1 capsule (100 mg total) by mouth every 12 (twelve) hours for 5 days. ?  ?oxyCODONE-acetaminophen 5-325 MG tablet ?Commonly known as: Percocet ?Take 1 tablet by mouth every 4 (four)  hours as needed. ?What changed: reasons to take this ?  ?polyethylene glycol 17 g packet ?Commonly known as: MiraLax ?Take 17 g by mouth daily. ?What changed:  ?when to take this ?reasons to take this ?  ?Vitamin D (Ergocalciferol) 1.25 MG (50000 UNIT) Caps capsule ?Commonly known as: DRISDOL ?Take 1 capsule (50,000 Units total) by mouth every 7 (seven) days. ?What changed:  ?when to take this ?additional instructions ?  ?Vitamin D-3 125 MCG (5000 UT) Tabs ?Take 1 tablet by mouth daily. ?  ? ?  ? ? ?Discharge Instructions: Please refer to Patient Instructions section of EMR for full details.  Patient was counseled important signs and symptoms that should prompt return to medical care, changes in medications, dietary instructions, activity restrictions, and follow up appointments.  ? ?Follow-Up Appointments: none ? ? ?France Ravens, MD ?03/08/2021, 2:43 PM ?PGY-1, [place] ?

## 2021-03-08 NOTE — Progress Notes (Signed)
FPTS Brief Note ?Reviewed patient's vitals, recent notes.  ?Vitals:  ? 03/07/21 2002 03/07/21 2053  ?BP:  (!) 144/87  ?Pulse:  97  ?Resp:  18  ?Temp:  97.8 ?F (36.6 ?C)  ?SpO2: 98%   ? ?At this time, no change in plan from day progress note.  ?Sharion Settler, DO ?Page [phone] with questions about this patient.  ? ? ? ?

## 2021-03-08 NOTE — Progress Notes (Signed)
Paged M.D on call ,patient was not happy with the changed of her pain medication,she said '' I am going home if they would not give me that iv pain medication". ?

## 2021-03-08 NOTE — Progress Notes (Signed)
Family Medicine Teaching Service ?Daily Progress Note ?Intern Pager: [phone] ? ?Patient name: Andrea Montgomery Medical record number: [id number] ?Date of birth: 01/14/1975 Age: 46 y.o. Gender: female ? ?Primary Care Provider: Vincente Liberty, MD ?Consultants: None ?Code Status: Full ? ?Pt Overview and Major Events to Date:  ?3/3- Admitted ? ?Assessment and Plan: ?Samra Pesch is a 46 year old female presenting with positive blood culture for Staph spp.  PMH significant for chronic back pain on high-dose opioids, type 2 diabetes, reported bone cancer (no documented history), hypertension, anemia requiring IV iron transfusions, tobacco use disorder, asthma. ? ?Concern for bacteremia ?VSS. WBC 10.1. Believe that this is likely contaminant and not a true bacteremia.  New blood cultures pending. Plan to discharge tomorrow if 48 hour repeat blood cultures.  ?- Vital signs per floor protocol ?- Pending repeat blood cultures, although collected s/p IV Vancomycin ?- S/p IV Vancomycin x1, continue dosing per pharmacy ?- AM labs: CBC, BMP ?- PT recommending HH PT, no OT follow up ? ?Left foot 5th toe osteomyelitis ?L foot MRI shows bone marrow edema in the fifth middle and distal phalanx concerning for osteomyelitis when correlated with radiographic findings. No indication of abscess. Ortho recommends hard sole shoe and f/u with Dr. Kathaleen Bury.  ? ?UTI ?Urine culture + E coli, enterococcus > 100,000. Urinary frequency and abdominal pain. No sign of upper urinary tract infection. Received PO macrobid in ED.  ?- [name] 100 bid every 12 hours for 1 week.  ? ?Chronic pain with opioid-induced constipation ?Requests PRN Dilaudid every 5 hours. Also on scheduled tylenol Q6h. Reports has not had a bowel movement since last Monday. Does report passing gas.  ?- Resuming home med norco 10 q6h prn ?- Tylenol scheduled q6h ?- Miralax 17g BID ?- Senna 2 tablets daily ?- Dulcolax suppository ? ?Type 2 Diabetes Mellitus,  insulin-dependent ?CBG elevated to 221 today. A1c 7.6 (7.3 10 months ago)  ?-CBG 4 times daily, before meals, and at bedtime.  ?-Continue metformin 1000 mg BID ?- Continue Lantus 15 u qhs ?-Resistant SSI ? ?Reported bone malignancy ?No documented history. Unable to reconcile patient's reporting and lack of documentation.  ? ?Other chronic conditions: ?Anemia: stable, monitor with CBC ?Hypertension: stable, continue home losartan ?Asthma: stable, continue home advair, albuterol PRN ? ?FEN/GI: Regular ?Prophylaxis: Lovenox ?Dispo:Home tomorrow. Barriers include pending blood cultures.  ? ?Subjective:  ?Patient seen and assessed at bedside.  Fianc? present in room.  Patient reports some nausea but is not in pain.  Attempted to confirm patient's cancer history.  Patient reports that it was "bone cancer that has spread everywhere" and that she sees Dr. Elliot Gault at [facility].  Patient reports she receives chemo therapy twice a week last time being last week right before hospitalization. When asked when patient would require chemotherapy next, patient reports that "I will schedule the visit once I get out of here".  Ellene Route? is adamant that she does see [facility] for her cancer treatment. ? ?Objective: ?Temp:  [97.8 ?F (36.6 ?C)-98.1 ?F (36.7 ?C)] 98.1 ?F (36.7 ?C) (03/05 0516) ?Pulse Rate:  [90-97] 94 (03/05 0516) ?Resp:  [18] 18 (03/05 0516) ?BP: (144-165)/(83-104) 147/83 (03/05 0516) ?SpO2:  [96 %-100 %] 96 % (03/05 0730) ?Physical Exam: ?General: Resting comfortably, NAD ?Cardiovascular: RRR, no murmurs noted ?Respiratory: CTAB. No wheezing ?Abdomen: Soft nontender. ?Extremities: warm, dry ? ?Laboratory: ?Recent Labs  ?Lab 03/04/21 ?1557 03/04/21 ?1655 03/06/21 ?1617 03/07/21 ?0208  ?WBC 18.0*  --  10.3 10.1  ?  HGB 14.8 17.3* 12.5 12.2  ?HCT 47.6* 51.0* 41.9 39.9  ?PLT 439*  --  356 312  ? ?Recent Labs  ?Lab 03/04/21 ?1557 03/04/21 ?1655 03/06/21 ?1617 03/07/21 ?0208  ?NA 132* 129* 138 137   ?K 3.9 4.6 3.6 3.6  ?CL 96* 98 98 97*  ?CO2 22  --  30 29  ?BUN 22* 30* 10 9  ?CREATININE 1.36* 1.20* 0.82 0.76  ?CALCIUM 8.7*  --  9.5 9.1  ?PROT 6.7  --  6.6  --   ?BILITOT 0.4  --  <0.1*  --   ?ALKPHOS 51  --  64  --   ?ALT 21  --  26  --   ?AST 26  --  44*  --   ?GLUCOSE 316* 312* 263* 256*  ? ? ? ? ?Imaging/Diagnostic Tests: ?No new. Blood culture pending. ? ?France Ravens, MD ?03/08/2021, 8:35 AM ?PGY-1, [place] ?[facility] Intern pager: [phone], text pages welcome ? ?

## 2021-03-08 NOTE — Telephone Encounter (Signed)
Post ED Visit - Positive Culture Follow-up ? ?Culture report reviewed by antimicrobial stewardship pharmacist: ?[name] ?'[x]'$  Laverta Baltimore Pharm.D. ?'[]'$  Heide Guile, Pharm.D., BCPS AQ-ID ?'[]'$  Parks Neptune, Pharm.D., BCPS ?'[]'$  Alycia Rossetti, Pharm.D., BCPS ?'[]'$  [place], Pharm.D., BCPS, AAHIVP ?'[]'$  Legrand Como, Pharm.D., BCPS, AAHIVP ?'[]'$  Salome Arnt, PharmD, BCPS ?'[]'$  Johnnette Gourd, PharmD, BCPS ?'[]'$  Hughes Better, PharmD, BCPS ?'[]'$  Leeroy Cha, PharmD ?'[]'$  Laqueta Linden, PharmD, BCPS ?'[]'$  Albertina Parr, PharmD ? ?[name] ?'[]'$  Leodis Sias, PharmD ?'[]'$  Lindell Spar, PharmD ?'[]'$  Royetta Asal, PharmD ?'[]'$  Graylin Shiver, Rph ?'[]'$  Rema Fendt) Glennon Mac, PharmD ?'[]'$  Arlyn Dunning, PharmD ?'[]'$  Netta Cedars, PharmD ?'[]'$  Dia Sitter, PharmD ?'[]'$  Leone Haven, PharmD ?'[]'$  Gretta Arab, PharmD ?'[]'$  Theodis Shove, PharmD ?'[]'$  Peggyann Juba, PharmD ?'[]'$  Reuel Boom, PharmD ? ? ?Positive urine culture ?Pt currently admitted to hospital and on vancomycin no further patient follow-up is required at this time. ? ?Andrea Montgomery ?03/08/2021, 11:21 AM ?  ?

## 2021-03-09 LAB — CULTURE, BLOOD (ROUTINE X 2): Culture: NO GROWTH

## 2021-03-09 NOTE — Plan of Care (Signed)
Called patient to follow-up on blood cultures.  Reported blood cultures were negative and encourage patient to finish [name] prescription.  Finally, provided patient with Dr. Wandalee Ferdinand information so that she can follow-up on her left fifth toe that is concerning for potential osteomyelitis in 1 week.  Patient was receptive and agreeable. ? ?France Ravens, PGY-1 ?Family Medicine Resident ? ?

## 2021-03-11 LAB — CULTURE, BLOOD (SINGLE): Culture: NO GROWTH

## 2021-03-12 LAB — CULTURE, BLOOD (ROUTINE X 2)
Culture: NO GROWTH
Culture: NO GROWTH
Special Requests: ADEQUATE
Special Requests: ADEQUATE

## 2021-06-25 ENCOUNTER — Ambulatory Visit: Payer: Medicaid Other | Admitting: Podiatry

## 2021-07-09 ENCOUNTER — Ambulatory Visit: Payer: Medicaid Other | Admitting: Podiatry

## 2021-07-23 ENCOUNTER — Ambulatory Visit: Payer: Medicaid Other | Admitting: Podiatry

## 2021-07-29 ENCOUNTER — Ambulatory Visit (INDEPENDENT_AMBULATORY_CARE_PROVIDER_SITE_OTHER): Payer: Medicaid Other | Admitting: Podiatry

## 2021-07-29 ENCOUNTER — Ambulatory Visit (INDEPENDENT_AMBULATORY_CARE_PROVIDER_SITE_OTHER): Payer: Medicaid Other

## 2021-07-29 DIAGNOSIS — M216X2 Other acquired deformities of left foot: Secondary | ICD-10-CM | POA: Diagnosis not present

## 2021-07-29 DIAGNOSIS — T1490XA Injury, unspecified, initial encounter: Secondary | ICD-10-CM | POA: Diagnosis not present

## 2021-07-29 DIAGNOSIS — E0843 Diabetes mellitus due to underlying condition with diabetic autonomic (poly)neuropathy: Secondary | ICD-10-CM

## 2021-07-29 NOTE — Progress Notes (Signed)
   Chief Complaint  Patient presents with   Foot Problem    Fracture of the 4th and 5th toes of the left foot    HPI: 46 y.o. female presenting today as a new patient referral from her PCP for routine diabetic foot exam as well as given a history of injury to the bilateral feet.  Patient states that several months ago in March 2023 she sustained an injury and ran her feet into the furniture.  It was painful and tender but over the past few months it has resolved.  She is doing well.  No new complaints at this time  Past Medical History:  Diagnosis Date   Anxiety    Asthma    COPD (chronic obstructive pulmonary disease) ([place])    Degenerative disc disease, lumbar    Diabetes mellitus    Emphysema of lung ([place])    Hypertension    Iron deficiency anemia 03/12/2014   Leg weakness    Low back pain     Past Surgical History:  Procedure Laterality Date   ceserean section times 2     DILITATION & CURRETTAGE/HYSTROSCOPY WITH HYDROTHERMAL ABLATION N/A 10/25/2014   Procedure: DILATATION & CURETTAGE/HYSTEROSCOPY WITH HYDROTHERMAL ABLATION;  Surgeon: Shelly Bombard, MD;  Location: [facility];  Service: Gynecology;  Laterality: N/A;   TUBAL LIGATION     tumor removed from leg      Allergies  Allergen Reactions   Penicillins Anaphylaxis   Flovent Hfa [Fluticasone]     migraine   Gabapentin Swelling     Physical Exam: General: The patient is alert and oriented x3 in no acute distress.  Dermatology: Skin is warm, dry and supple bilateral lower extremities. Negative for open lesions or macerations.  Vascular: Palpable pedal pulses bilaterally. Capillary refill within normal limits.  Negative for any significant edema or erythema  Neurological: Light touch and protective threshold grossly intact  Musculoskeletal Exam: No pedal deformities noted  Radiographic Exam:  Normal osseous mineralization. Joint spaces preserved. No fracture/dislocation/boney destruction.   MR FOOT LEFT WO  CONTRAST 03/07/2021 IMPRESSION: 1. Bone marrow edema in the fifth middle and distal phalanx concerning for osteomyelitis when correlated with radiographic findings. No drainable fluid collection to suggest an abscess.   Assessment: 1. H/o trauma to the bilateral toes; resolved 2.  Diabetes mellitus type 2; uncomplicated.  Last A1c 03/07/2021 7.6   Plan of Care:  1. Patient evaluated. X-Rays reviewed.  2.  Comprehensive diabetic foot exam performed today 3.  The patient does not have any tenderness or pain associated to the history of trauma given to the bilateral feet.  Overall she is doing well. 4.  Continue wearing good supportive shoes and sneakers 5.  Continue management with PCP for diabetic control 6.  Return to clinic annually      Edrick Kins, DPM [facility]  Dr. Edrick Kins, DPM    [address],  [address]                Office [phone]  Fax [phone]

## 2021-10-15 ENCOUNTER — Other Ambulatory Visit (HOSPITAL_COMMUNITY): Payer: Self-pay

## 2021-10-15 MED ORDER — HYDROCODONE-ACETAMINOPHEN 10-325 MG PO TABS
0.5000 | ORAL_TABLET | ORAL | 0 refills | Status: DC
  Filled 2021-10-15: qty 150, 30d supply, fill #0

## 2021-10-27 NOTE — H&P (Signed)
  Patient: Andrea Montgomery  PID: [id number]  DOB: [birth year]  SEX: Female   Self referred for extraction all remaining teeth. Has Medicaid approval letter for dentures.  CC: Teeth hurt (points to #8 and 31).  Past Medical History: HTN, Smoker, Diabetes, COPD, Asthma,  Arthritis, Morbid Obesity    Medications: Albuterol, Advair, Xanax, Insulin, Lantis, Metformin, Lasix, amitriptyline, Losartan    Allergies:     Penicillin, Gabapentin    Surgeries:   Oral Surgery, C Section     Social History       Smoking: 2 cigarettes/day           Alcohol:n Drug use:n                             Exam: BMI 41. Gross decay all remaining teeth.  No purulence, edema, fluctuance, trismus. Oral cancer screening negative. Pharynx clear. No lymphadenopathy.  Panorex:Gross decay all remaining teeth #'s 3, 5, 6, 8, 9, 11, 12, 14, 18, 20, 22, 23, 24, 25, 26, 27, 28, 30, 31.    Assessment: ASA 3. Non-restorable  teeth #'s 3, 5, 6, 8, 9, 11, 12, 14, 18, 20, 22, 23, 24, 25, 26, 27, 28, 30, 31.                 Plan: Extraction Teeth #  3, 5, 6, 8, 9, 11, 12, 14, 18, 20, 22, 23, 24, 25, 26, 27, 28, 30, 31. Alveoloplasty.    Hospital Day surgery.                           Risks and complications explained. Questions answered.   Gae Bon, DMD

## 2021-10-29 ENCOUNTER — Other Ambulatory Visit: Payer: Self-pay

## 2021-10-29 ENCOUNTER — Encounter (HOSPITAL_COMMUNITY): Payer: Self-pay | Admitting: Oral Surgery

## 2021-10-29 NOTE — Progress Notes (Shared)
Ms Andrea Montgomery denies chest pain or shortness of breath. Patient denies having any s/s of Covid in her household, also denies any known exposure to Covid.   Ms Andrea Montgomery' PCP is Dr. Vincente Liberty.   Ms Andrea Montgomery has type II diabetes, patient checks CBG 3 times a day,it runs in the 200's.  I instructed patient to take 7 units of Novolog 70//30 with dinner; Lantus take 6 units. DO not take Metformin in am.I instructed patient to check CBG after awaking and every 2 hours until arrival  to the hospital.  I Instructed patient if CBG is less than 70 to take 4 Glucose Tablets or 1 tube of Glucose Gel or 1/2 cup of a clear juice. Recheck CBG in 15 minutes if CBG is not over 70 call, pre- op desk at [phone] for further instructions.

## 2021-10-30 ENCOUNTER — Encounter (HOSPITAL_COMMUNITY)
Admission: RE | Disposition: A | Discharge status: Home / Self Care | Payer: Self-pay | Source: Home / Self Care | Attending: Oral Surgery

## 2021-10-30 ENCOUNTER — Ambulatory Visit (HOSPITAL_BASED_OUTPATIENT_CLINIC_OR_DEPARTMENT_OTHER): Payer: Medicaid Other | Admitting: Certified Registered Nurse Anesthetist

## 2021-10-30 ENCOUNTER — Ambulatory Visit (HOSPITAL_COMMUNITY)
Admission: RE | Admit: 2021-10-30 | Discharge: 2021-10-30 | Disposition: A | Discharge status: Home / Self Care | Payer: Medicaid Other | Attending: Oral Surgery | Admitting: Oral Surgery

## 2021-10-30 ENCOUNTER — Ambulatory Visit (HOSPITAL_COMMUNITY): Payer: Medicaid Other | Admitting: Certified Registered Nurse Anesthetist

## 2021-10-30 ENCOUNTER — Encounter (HOSPITAL_COMMUNITY): Payer: Self-pay | Admitting: Oral Surgery

## 2021-10-30 ENCOUNTER — Other Ambulatory Visit: Payer: Self-pay

## 2021-10-30 DIAGNOSIS — Z794 Long term (current) use of insulin: Secondary | ICD-10-CM | POA: Insufficient documentation

## 2021-10-30 DIAGNOSIS — G709 Myoneural disorder, unspecified: Secondary | ICD-10-CM | POA: Diagnosis not present

## 2021-10-30 DIAGNOSIS — F419 Anxiety disorder, unspecified: Secondary | ICD-10-CM | POA: Insufficient documentation

## 2021-10-30 DIAGNOSIS — F17210 Nicotine dependence, cigarettes, uncomplicated: Secondary | ICD-10-CM | POA: Insufficient documentation

## 2021-10-30 DIAGNOSIS — K029 Dental caries, unspecified: Secondary | ICD-10-CM

## 2021-10-30 DIAGNOSIS — Z7984 Long term (current) use of oral hypoglycemic drugs: Secondary | ICD-10-CM | POA: Diagnosis not present

## 2021-10-30 DIAGNOSIS — J449 Chronic obstructive pulmonary disease, unspecified: Secondary | ICD-10-CM | POA: Diagnosis not present

## 2021-10-30 DIAGNOSIS — I10 Essential (primary) hypertension: Secondary | ICD-10-CM

## 2021-10-30 DIAGNOSIS — M1990 Unspecified osteoarthritis, unspecified site: Secondary | ICD-10-CM | POA: Diagnosis not present

## 2021-10-30 DIAGNOSIS — Z6839 Body mass index (BMI) 39.0-39.9, adult: Secondary | ICD-10-CM | POA: Insufficient documentation

## 2021-10-30 DIAGNOSIS — E119 Type 2 diabetes mellitus without complications: Secondary | ICD-10-CM | POA: Insufficient documentation

## 2021-10-30 HISTORY — PX: TOOTH EXTRACTION: SHX859

## 2021-10-30 HISTORY — PX: ALVEOLOPLASTY: SHX5710

## 2021-10-30 HISTORY — DX: Other specified postprocedural states: R11.2

## 2021-10-30 HISTORY — DX: Other specified postprocedural states: Z98.890

## 2021-10-30 HISTORY — DX: Pneumonia, unspecified organism: J18.9

## 2021-10-30 LAB — CBC
HCT: 39.8 % (ref 36.0–46.0)
Hemoglobin: 12.7 g/dL (ref 12.0–15.0)
MCH: 28 pg (ref 26.0–34.0)
MCHC: 31.9 g/dL (ref 30.0–36.0)
MCV: 87.9 fL (ref 80.0–100.0)
Platelets: 292 10*3/uL (ref 150–400)
RBC: 4.53 MIL/uL (ref 3.87–5.11)
RDW: 14.1 % (ref 11.5–15.5)
WBC: 8.2 10*3/uL (ref 4.0–10.5)
nRBC: 0.2 % (ref 0.0–0.2)

## 2021-10-30 LAB — BASIC METABOLIC PANEL
Anion gap: 10 (ref 5–15)
BUN: 14 mg/dL (ref 6–20)
CO2: 28 mmol/L (ref 22–32)
Calcium: 9.2 mg/dL (ref 8.9–10.3)
Chloride: 101 mmol/L (ref 98–111)
Creatinine, Ser: 0.8 mg/dL (ref 0.44–1.00)
GFR, Estimated: >60 mL/min (ref >60)
Glucose, Bld: 219 mg/dL — ABNORMAL HIGH (ref 70–99)
Potassium: 4.3 mmol/L (ref 3.5–5.1)
Sodium: 139 mmol/L (ref 135–145)

## 2021-10-30 LAB — GLUCOSE, CAPILLARY
Glucose-Capillary: 243 mg/dL — ABNORMAL HIGH (ref 70–99)
Glucose-Capillary: 237 mg/dL — ABNORMAL HIGH (ref 70–99)
Glucose-Capillary: 259 mg/dL — ABNORMAL HIGH (ref 70–99)

## 2021-10-30 LAB — POCT PREGNANCY, URINE: Preg Test, Ur: NEGATIVE

## 2021-10-30 SURGERY — DENTAL RESTORATION/EXTRACTIONS
Anesthesia: General | Site: Mouth

## 2021-10-30 MED ORDER — INSULIN ASPART 100 UNIT/ML IJ SOLN
0.0000 [IU] | INTRAMUSCULAR | Status: DC | PRN
  Administered 2021-10-30: 6 [IU] via SUBCUTANEOUS
  Filled 2021-10-30: qty 1

## 2021-10-30 MED ORDER — DEXAMETHASONE SODIUM PHOSPHATE 10 MG/ML IJ SOLN
INTRAMUSCULAR | Status: DC | PRN
  Administered 2021-10-30: 5 mg via INTRAVENOUS

## 2021-10-30 MED ORDER — ONDANSETRON HCL 4 MG/2ML IJ SOLN
INTRAMUSCULAR | Status: DC | PRN
  Administered 2021-10-30: 4 mg via INTRAVENOUS

## 2021-10-30 MED ORDER — LIDOCAINE-EPINEPHRINE 2 %-1:100000 IJ SOLN
INTRAMUSCULAR | Status: AC
  Filled 2021-10-30: qty 1

## 2021-10-30 MED ORDER — ORAL CARE MOUTH RINSE: 15.0000 mL | Freq: Once | OROMUCOSAL | Status: AC

## 2021-10-30 MED ORDER — OXYMETAZOLINE HCL 0.05 % NA SOLN
NASAL | Status: DC | PRN
  Administered 2021-10-30: 2 via NASAL

## 2021-10-30 MED ORDER — DEXAMETHASONE SODIUM PHOSPHATE 10 MG/ML IJ SOLN
INTRAMUSCULAR | Status: AC
  Filled 2021-10-30: qty 2

## 2021-10-30 MED ORDER — BUPIVACAINE-EPINEPHRINE (PF) 0.25% -1:200000 IJ SOLN
INTRAMUSCULAR | Status: DC | PRN
  Administered 2021-10-30: 17 mL

## 2021-10-30 MED ORDER — SUCCINYLCHOLINE CHLORIDE 200 MG/10ML IV SOSY
PREFILLED_SYRINGE | INTRAVENOUS | Status: AC
  Filled 2021-10-30: qty 10

## 2021-10-30 MED ORDER — SUGAMMADEX SODIUM 200 MG/2ML IV SOLN
INTRAVENOUS | Status: DC | PRN
  Administered 2021-10-30: 180 mg via INTRAVENOUS
  Administered 2021-10-30: 100 mg via INTRAVENOUS

## 2021-10-30 MED ORDER — OXYCODONE HCL 5 MG PO TABS: 5.0000 mg | ORAL_TABLET | Freq: Once | ORAL | Status: AC | PRN

## 2021-10-30 MED ORDER — CHLORHEXIDINE GLUCONATE 0.12 % MT SOLN
15.0000 mL | Freq: Once | OROMUCOSAL | Status: AC
  Administered 2021-10-30: 15 mL via OROMUCOSAL
  Filled 2021-10-30: qty 15

## 2021-10-30 MED ORDER — BUPIVACAINE-EPINEPHRINE (PF) 0.25% -1:200000 IJ SOLN
INTRAMUSCULAR | Status: AC
  Filled 2021-10-30: qty 30

## 2021-10-30 MED ORDER — OXYCODONE-ACETAMINOPHEN 10-325 MG PO TABS: 1.0000 | ORAL_TABLET | ORAL | 0 refills | Status: DC | PRN

## 2021-10-30 MED ORDER — ONDANSETRON HCL 4 MG/2ML IJ SOLN: 4.0000 mg | Freq: Four times a day (QID) | INTRAMUSCULAR | Status: DC | PRN

## 2021-10-30 MED ORDER — OXYCODONE HCL 5 MG PO TABS
ORAL_TABLET | ORAL | Status: AC
  Administered 2021-10-30: 5 mg via ORAL
  Filled 2021-10-30: qty 1

## 2021-10-30 MED ORDER — PROPOFOL 10 MG/ML IV BOLUS
INTRAVENOUS | Status: AC
  Filled 2021-10-30: qty 20

## 2021-10-30 MED ORDER — FENTANYL CITRATE (PF) 100 MCG/2ML IJ SOLN
25.0000 ug | INTRAMUSCULAR | Status: DC | PRN
  Administered 2021-10-30 (×2): 25 ug via INTRAVENOUS

## 2021-10-30 MED ORDER — FENTANYL CITRATE (PF) 100 MCG/2ML IJ SOLN
INTRAMUSCULAR | Status: AC
  Administered 2021-10-30: 25 ug via INTRAVENOUS
  Filled 2021-10-30: qty 2

## 2021-10-30 MED ORDER — SUGAMMADEX SODIUM 200 MG/2ML IV SOLN: INTRAVENOUS | Status: DC | PRN

## 2021-10-30 MED ORDER — CHLORHEXIDINE GLUCONATE 0.12 % MT SOLN: 15.0000 mL | Freq: Two times a day (BID) | OROMUCOSAL | 0 refills | Status: DC

## 2021-10-30 MED ORDER — CLINDAMYCIN HCL 300 MG PO CAPS: 300.0000 mg | ORAL_CAPSULE | Freq: Three times a day (TID) | ORAL | 0 refills | Status: DC

## 2021-10-30 MED ORDER — LIDOCAINE 2% (20 MG/ML) 5 ML SYRINGE
INTRAMUSCULAR | Status: DC | PRN
  Administered 2021-10-30: 60 mg via INTRAVENOUS

## 2021-10-30 MED ORDER — FENTANYL CITRATE (PF) 250 MCG/5ML IJ SOLN
INTRAMUSCULAR | Status: DC | PRN
  Administered 2021-10-30 (×4): 50 ug via INTRAVENOUS

## 2021-10-30 MED ORDER — SCOPOLAMINE 1 MG/3DAYS TD PT72
MEDICATED_PATCH | TRANSDERMAL | Status: DC | PRN
  Administered 2021-10-30: 1 via TRANSDERMAL

## 2021-10-30 MED ORDER — LACTATED RINGERS IV SOLN: INTRAVENOUS | Status: DC

## 2021-10-30 MED ORDER — PROPOFOL 10 MG/ML IV BOLUS
INTRAVENOUS | Status: DC | PRN
  Administered 2021-10-30: 200 mg via INTRAVENOUS

## 2021-10-30 MED ORDER — ROCURONIUM BROMIDE 10 MG/ML (PF) SYRINGE
PREFILLED_SYRINGE | INTRAVENOUS | Status: DC | PRN
  Administered 2021-10-30: 50 mg via INTRAVENOUS

## 2021-10-30 MED ORDER — ROCURONIUM BROMIDE 10 MG/ML (PF) SYRINGE
PREFILLED_SYRINGE | INTRAVENOUS | Status: AC
  Filled 2021-10-30: qty 20

## 2021-10-30 MED ORDER — 0.9 % SODIUM CHLORIDE (POUR BTL) OPTIME
TOPICAL | Status: DC | PRN
  Administered 2021-10-30: 1000 mL

## 2021-10-30 MED ORDER — LIDOCAINE-EPINEPHRINE 2 %-1:100000 IJ SOLN
INTRAMUSCULAR | Status: DC | PRN
  Administered 2021-10-30: 18 mL

## 2021-10-30 MED ORDER — FENTANYL CITRATE (PF) 250 MCG/5ML IJ SOLN
INTRAMUSCULAR | Status: AC
  Filled 2021-10-30: qty 5

## 2021-10-30 MED ORDER — KETOROLAC TROMETHAMINE 30 MG/ML IJ SOLN
INTRAMUSCULAR | Status: AC
  Administered 2021-10-30: 30 mg via INTRAVENOUS
  Filled 2021-10-30: qty 1

## 2021-10-30 MED ORDER — KETOROLAC TROMETHAMINE 30 MG/ML IJ SOLN: 30.0000 mg | Freq: Once | INTRAMUSCULAR | Status: AC

## 2021-10-30 MED ORDER — ONDANSETRON HCL 4 MG/2ML IJ SOLN
INTRAMUSCULAR | Status: AC
  Filled 2021-10-30: qty 6

## 2021-10-30 MED ORDER — PHENYLEPHRINE 80 MCG/ML (10ML) SYRINGE FOR IV PUSH (FOR BLOOD PRESSURE SUPPORT)
PREFILLED_SYRINGE | INTRAVENOUS | Status: DC | PRN
  Administered 2021-10-30 (×3): 80 ug via INTRAVENOUS
  Administered 2021-10-30: 160 ug via INTRAVENOUS

## 2021-10-30 MED ORDER — PHENYLEPHRINE 80 MCG/ML (10ML) SYRINGE FOR IV PUSH (FOR BLOOD PRESSURE SUPPORT)
PREFILLED_SYRINGE | INTRAVENOUS | Status: AC
  Filled 2021-10-30: qty 10

## 2021-10-30 MED ORDER — MIDAZOLAM HCL 2 MG/2ML IJ SOLN
INTRAMUSCULAR | Status: AC
  Filled 2021-10-30: qty 2

## 2021-10-30 MED ORDER — MIDAZOLAM HCL 2 MG/2ML IJ SOLN
INTRAMUSCULAR | Status: DC | PRN
  Administered 2021-10-30: 2 mg via INTRAVENOUS

## 2021-10-30 MED ORDER — ATROPINE SULFATE 0.4 MG/ML IV SOLN
INTRAVENOUS | Status: AC
  Filled 2021-10-30: qty 1

## 2021-10-30 MED ORDER — ACETAMINOPHEN 10 MG/ML IV SOLN
INTRAVENOUS | Status: AC
  Filled 2021-10-30: qty 100

## 2021-10-30 MED ORDER — OXYCODONE-ACETAMINOPHEN 10-325 MG PO TABS: 1.0000 | ORAL_TABLET | Freq: Four times a day (QID) | ORAL | 0 refills | Status: DC | PRN

## 2021-10-30 MED ORDER — VANCOMYCIN HCL IN DEXTROSE 1-5 GM/200ML-% IV SOLN
1000.0000 mg | INTRAVENOUS | Status: AC
  Administered 2021-10-30: 1000 mg via INTRAVENOUS
  Filled 2021-10-30: qty 200

## 2021-10-30 MED ORDER — ACETAMINOPHEN 10 MG/ML IV SOLN
INTRAVENOUS | Status: DC | PRN
  Administered 2021-10-30: 1000 mg via INTRAVENOUS

## 2021-10-30 MED ORDER — OXYCODONE HCL 5 MG/5ML PO SOLN: 5.0000 mg | Freq: Once | ORAL | Status: AC | PRN

## 2021-10-30 MED ORDER — LIDOCAINE 2% (20 MG/ML) 5 ML SYRINGE
INTRAMUSCULAR | Status: AC
  Filled 2021-10-30: qty 10

## 2021-10-30 SURGICAL SUPPLY — 38 items
BAG COUNTER SPONGE SURGICOUNT (BAG) IMPLANT
BAG SPNG CNTER NS LX DISP (BAG)
BLADE SURG 15 STRL LF DISP TIS (BLADE) ×2 IMPLANT
BLADE SURG 15 STRL SS (BLADE) ×2
BUR CROSS CUT FISSURE 1.6 (BURR) ×2 IMPLANT
BUR EGG ELITE 4.0 (BURR) ×2 IMPLANT
CANISTER SUCT 3000ML PPV (MISCELLANEOUS) ×2 IMPLANT
COVER SURGICAL LIGHT HANDLE (MISCELLANEOUS) ×2 IMPLANT
GAUZE PACKING FOLDED 2  STR (GAUZE/BANDAGES/DRESSINGS) ×2
GAUZE PACKING FOLDED 2 STR (GAUZE/BANDAGES/DRESSINGS) ×2 IMPLANT
GLOVE BIO SURGEON STRL SZ 6.5 (GLOVE) IMPLANT
GLOVE BIO SURGEON STRL SZ7 (GLOVE) IMPLANT
GLOVE BIO SURGEON STRL SZ8 (GLOVE) ×2 IMPLANT
GLOVE BIOGEL PI IND STRL 6.5 (GLOVE) IMPLANT
GLOVE BIOGEL PI IND STRL 7.0 (GLOVE) IMPLANT
GOWN STRL REUS W/ TWL LRG LVL3 (GOWN DISPOSABLE) ×2 IMPLANT
GOWN STRL REUS W/ TWL XL LVL3 (GOWN DISPOSABLE) ×2 IMPLANT
GOWN STRL REUS W/TWL LRG LVL3 (GOWN DISPOSABLE) ×2
GOWN STRL REUS W/TWL XL LVL3 (GOWN DISPOSABLE) ×2
IV NS 1000ML (IV SOLUTION)
IV NS 1000ML BAXH (IV SOLUTION) ×2 IMPLANT
IV NS 250ML (IV SOLUTION) ×2
IV NS 250ML BAXH (IV SOLUTION) IMPLANT
KIT BASIN OR (CUSTOM PROCEDURE TRAY) ×2 IMPLANT
KIT TURNOVER KIT B (KITS) ×2 IMPLANT
NDL HYPO 25GX1X1/2 BEV (NEEDLE) ×4 IMPLANT
NEEDLE HYPO 25GX1X1/2 BEV (NEEDLE) ×2 IMPLANT
NS IRRIG 1000ML POUR BTL (IV SOLUTION) ×2 IMPLANT
PAD ARMBOARD 7.5X6 YLW CONV (MISCELLANEOUS) ×2 IMPLANT
SLEEVE IRRIGATION ELITE 7 (MISCELLANEOUS) ×2 IMPLANT
SPIKE FLUID TRANSFER (MISCELLANEOUS) ×2 IMPLANT
SPONGE SURGIFOAM ABS GEL 12-7 (HEMOSTASIS) IMPLANT
SUT CHROMIC 3 0 PS 2 (SUTURE) ×2 IMPLANT
SYR BULB IRRIG 60ML STRL (SYRINGE) ×2 IMPLANT
SYR CONTROL 10ML LL (SYRINGE) ×2 IMPLANT
TRAY ENT MC OR (CUSTOM PROCEDURE TRAY) ×2 IMPLANT
TUBING IRRIGATION (MISCELLANEOUS) ×2 IMPLANT
YANKAUER SUCT BULB TIP NO VENT (SUCTIONS) ×2 IMPLANT

## 2021-10-30 NOTE — Anesthesia Preprocedure Evaluation (Signed)
Anesthesia Evaluation  Patient identified by MRN, date of birth, ID band Patient awake    Reviewed: Allergy & Precautions, H&P , NPO status , Patient's Chart, lab work & pertinent test results  History of Anesthesia Complications (+) PONV and history of anesthetic complications  Airway Mallampati: II   Neck ROM: full    Dental   Pulmonary asthma , COPD, Current Smoker and Patient abstained from smoking.,    breath sounds clear to auscultation       Cardiovascular hypertension,  Rhythm:regular Rate:Normal     Neuro/Psych Anxiety  Neuromuscular disease    GI/Hepatic   Endo/Other  diabetes, Type 2Morbid obesity  Renal/GU      Musculoskeletal  (+) Arthritis ,   Abdominal   Peds  Hematology   Anesthesia Other Findings   Reproductive/Obstetrics                             Anesthesia Physical Anesthesia Plan  ASA: 3  Anesthesia Plan: General   Post-op Pain Management:    Induction: Intravenous  PONV Risk Score and Plan: 3 and Ondansetron, Dexamethasone, Midazolam and Treatment may vary due to age or medical condition  Airway Management Planned: Nasal ETT  Additional Equipment:   Intra-op Plan:   Post-operative Plan: Extubation in OR  Informed Consent: I have reviewed the patients History and Physical, chart, labs and discussed the procedure including the risks, benefits and alternatives for the proposed anesthesia with the patient or authorized representative who has indicated his/her understanding and acceptance.     Dental advisory given  Plan Discussed with: CRNA, Anesthesiologist and Surgeon  Anesthesia Plan Comments:         Anesthesia Quick Evaluation

## 2021-10-30 NOTE — Op Note (Unsigned)
NAME: Andrea Montgomery, [place] RECORD NO: [id number] ACCOUNT NO: [id number] DATE OF BIRTH: 11/27/1975 FACILITY: [facility] LOCATION: [facility] PHYSICIAN: Gae Bon, DDS  Operative Report   DATE OF PROCEDURE: 10/30/2021  PREOPERATIVE DIAGNOSIS:  Nonrestorable teeth 3, 6, 8, 9, 11, 12 14, 18, 20, 22, 23, 24, 25, 26, 27, 28, 30, 31.  POSTOPERATIVE DIAGNOSIS:  Nonrestorable teeth 3, 6, 8, 9, 11, 12 14, 18, 20, 22, 23 25, 24, 26, 27, 28, 30, 31.  PROCEDURE:  Extraction teeth 3, 6, 8, 9, 11, 12, 14, 18, 20, 22, 23, 24, 25, 26, 27, 28, 30, 31. Alveoloplasty, right and left maxilla and mandible.  SURGEON:  Gae Bon, DDS  ANESTHESIA:  General, nasal intubation, Dr. Marcie Bal attending.  DESCRIPTION OF PROCEDURE:  The patient was taken to the operating room and placed on the table in supine position.  General anesthesia was administered and nasal endotracheal tube was placed and secured and the eyes were protected.  The patient was  draped for surgery.  Timeout was performed.  The posterior pharynx was suctioned and a throat pack was placed.  2% lidocaine 1:100,000 epinephrine was infiltrated in an inferior alveolar block on the right and left sides and in buccal infiltration of the  anterior mandible and then the maxilla was infiltrated buccally and palatally around the teeth to be removed.  A bite block was placed on the right side of the mouth and a sweetheart retractor was used to retract the tongue.  A #15 blade was used to  make an incision circumferentially in the gingival sulcus around tooth #18 carried forward to tooth #20 and then around teeth numbers 22, 23, 24, 25, 26.  The periosteum was reflected from around these teeth.  The teeth were elevated.  Tooth #18 was  grasped with the forceps was immobile.  The tooth was then sectioned with a Stryker handpiece and the roots were removed individually with a 301 elevator.  Tooth #20 was grasped with a forceps, but could not be elevated or  removed.  The Stryker handpiece  was used to remove circumferential bone around this tooth and the tooth was elevated with a 301 elevator and removed from the mouth.  Then, tooth #22 was grasped with forceps would not move and so bone was removed around the tooth and the tooth was  elevated and removed with Ash forceps.  Then, teeth numbers 23, 24, 25, 26 were removed with the Ash forceps.  The sockets were curetted.  The periosteum was reflected to expose the bone, which was irregular in contour.  Alveoplasty was performed using  the egg bur followed by the bone file.  Then, the area was irrigated and closed with 3-0 chromic.  The left maxilla was operated.  Next, a 15 blade was used to make an incision around teeth tooth #14 carried forward to teeth numbers 12, 11 and then 9 and  8.  The periosteum was reflected from around these teeth.  Teeth were elevated. Teeth number 14, 12 and 11 required removal of circumferential bone.  Tooth #14 was sectioned into multiple pieces to be removed individually with 301 elevator and the  rongeurs.  Teeth number 11 and 12 were removed with 301 elevator and rongeurs.  Teeth numbers 8 and 9 were removed in simple fashion with dental forceps.  Then, these periosteum was reflected.  The sockets were curetted.  Tissue was trimmed to provide  for primary closure and alveoplasty was performed using the egg bur and  then followed by the bone file.  Then, the sweetheart and biteblock were repositioned to the other side of the mouth and attention was turned to the right side.  The 15 blade was  used to make an incision around tooth numbers 27, 28, 30 and 31 in the mandible and around teeth numbers 3, 5 and 6 in the maxilla.  The periosteum was reflected from around these teeth.  The teeth were elevated. Teeth numbers 5, 6, 27, 28 and 31 were  removed with a forceps.  Teeth numbers 30 and 3 required sectioning to remove the teeth.  Once the teeth were removed, the sockets were  curetted.  The periosteum was reflected to expose the alveolar bone, which was irregular in contour.  Alveoplasty was  performed using the egg bur followed by the bone file.  Then, the area was irrigated and closed with 3-0 chromic.  The oral cavity was then irrigated and suctioned.  Additional local was administered Marcaine 0.25% with epinephrine and then the throat  pack was removed.  The patient was left under care of anesthesia for extubation and transport to recovery room with plans for discharge home through day surgery.  ESTIMATED BLOOD LOSS:  Minimal.  COMPLICATIONS:  None.   PUS D: 10/30/2021 11:52:48 am T: 10/30/2021 3:36:00 pm  JOB: [id number]/ [id number]

## 2021-10-30 NOTE — Op Note (Signed)
10/30/2021  11:43 AM  PATIENT:  Andrea Montgomery  46 y.o. female  PRE-OPERATIVE DIAGNOSIS:  NON-RESTORABLE TEETH # 3, 6, 8, 9, 11, 12, 14, 18, 20, 22, 23, 25, 24, 25, 26, 27, 28, 30, 31  SECONDARY TO DENTAL CARIES  POST-OPERATIVE DIAGNOSIS:  SAME  PROCEDURE:  EXTRACTION  TEETH # 3, 6, 8, 9, 11, 12, 14, 18, 20, 22, 23, 25, 24, 25, 26, 27, 28, 30, 31;  ALVEOLOPLASTY MAXILLA AND MANDIBLE  SURGEON:  Surgeon(s): Diona Browner, DMD  ANESTHESIA:   local and general  EBL:  minimal  DRAINS: none   SPECIMEN:  No Specimen  COUNTS:  YES  PLAN OF CARE: Discharge to home after PACU  PATIENT DISPOSITION:  PACU - hemodynamically stable.   PROCEDURE DETAILS: Dictation #[id number]  Gae Bon, DMD 10/30/2021 11:43 AM

## 2021-10-30 NOTE — Anesthesia Procedure Notes (Signed)
Procedure Name: Intubation Date/Time: 10/30/2021 10:17 AM  Performed by: Carolan Clines, CRNAPre-anesthesia Checklist: Patient identified, Emergency Drugs available, Suction available and Patient being monitored Patient Re-evaluated:Patient Re-evaluated prior to induction Oxygen Delivery Method: Circle System Utilized Preoxygenation: Pre-oxygenation with 100% oxygen Induction Type: IV induction Ventilation: Mask ventilation without difficulty and Oral airway inserted - appropriate to patient size Laryngoscope Size: Mac and 3 Grade View: Grade I Nasal Tubes: Nasal Rae and Nasal prep performed Tube size: 6.5 mm Number of attempts: 1 Placement Confirmation: ETT inserted through vocal cords under direct vision, positive ETCO2 and breath sounds checked- equal and bilateral Tube secured with: Tape Dental Injury: Teeth and Oropharynx as per pre-operative assessment

## 2021-10-30 NOTE — Transfer of Care (Signed)
Immediate Anesthesia Transfer of Care Note  Patient: Andrea Montgomery  Procedure(s) Performed: DENTAL RESTORATION/EXTRACTIONS of 3,5,6,8,9,11,12,14,18,19,20,22,23,25,26,27,28,30,31 (Mouth) ALVEOLOPLASTY (Bilateral: Mouth)  Patient Location: PACU  Anesthesia Type:General  Level of Consciousness: awake, alert  and oriented  Airway & Oxygen Therapy: Patient Spontanous Breathing and Patient connected to face mask oxygen  Post-op Assessment: Report given to RN and Post -op Vital signs reviewed and stable  Post vital signs: Reviewed and stable  Last Vitals:  Vitals Value Taken Time  BP 181/113 10/30/21 1209  Temp    Pulse 90 10/30/21 1214  Resp 19 10/30/21 1214  SpO2 95 % 10/30/21 1214  Vitals shown include unvalidated device data.  Last Pain:  Vitals:   10/30/21 0816  TempSrc:   PainSc: 6          Complications: No notable events documented.

## 2021-10-30 NOTE — H&P (Signed)
H&P documentation  -History and Physical Reviewed  -Patient has been re-examined  -No change in the plan of care  Andrea Montgomery  

## 2021-11-02 ENCOUNTER — Encounter (HOSPITAL_COMMUNITY): Payer: Self-pay | Admitting: Oral Surgery

## 2021-11-02 NOTE — Anesthesia Postprocedure Evaluation (Signed)
Anesthesia Post Note  Patient: Andrea Montgomery  Procedure(s) Performed: DENTAL RESTORATION/EXTRACTIONS of 3,5,6,8,9,11,12,14,18,19,20,22,23,25,26,27,28,30,31 (Mouth) ALVEOLOPLASTY (Bilateral: Mouth)     Patient location during evaluation: PACU Anesthesia Type: General Level of consciousness: awake and alert Pain management: pain level controlled Vital Signs Assessment: post-procedure vital signs reviewed and stable Respiratory status: spontaneous breathing, nonlabored ventilation, respiratory function stable and patient connected to nasal cannula oxygen Cardiovascular status: blood pressure returned to baseline and stable Postop Assessment: no apparent nausea or vomiting Anesthetic complications: no   No notable events documented.  Last Vitals:  Vitals:   10/30/21 1300 10/30/21 1315  BP: (!) 183/104 (!) 172/96  Pulse:  90  Resp:  18  Temp:  (!) 36.2 C  SpO2:  93%    Last Pain:  Vitals:   10/30/21 1315  TempSrc:   PainSc: [place]

## 2021-11-11 ENCOUNTER — Other Ambulatory Visit (HOSPITAL_COMMUNITY): Payer: Self-pay

## 2021-11-11 MED ORDER — CYCLOBENZAPRINE HCL 10 MG PO TABS
5.0000 mg | ORAL_TABLET | Freq: Three times a day (TID) | ORAL | 0 refills | Status: AC | PRN
  Filled 2021-11-11: qty 63, 21d supply, fill #0

## 2021-11-11 MED ORDER — OXYCODONE-ACETAMINOPHEN 10-325 MG PO TABS
1.0000 | ORAL_TABLET | Freq: Four times a day (QID) | ORAL | 0 refills | Status: DC | PRN
  Filled 2021-11-11 (×2): qty 120, 30d supply, fill #0

## 2021-12-01 ENCOUNTER — Encounter: Payer: Self-pay | Admitting: Pulmonary Disease

## 2021-12-01 ENCOUNTER — Other Ambulatory Visit: Payer: Self-pay | Admitting: Pulmonary Disease

## 2021-12-01 DIAGNOSIS — N6320 Unspecified lump in the left breast, unspecified quadrant: Secondary | ICD-10-CM

## 2021-12-10 ENCOUNTER — Other Ambulatory Visit (HOSPITAL_COMMUNITY): Payer: Self-pay

## 2021-12-10 MED ORDER — CYCLOBENZAPRINE HCL 10 MG PO TABS
5.0000 mg | ORAL_TABLET | Freq: Three times a day (TID) | ORAL | 0 refills | Status: DC | PRN
  Filled 2021-12-10: qty 63, 21d supply, fill #0

## 2021-12-10 MED ORDER — OXYCODONE-ACETAMINOPHEN 10-325 MG PO TABS
1.0000 | ORAL_TABLET | Freq: Four times a day (QID) | ORAL | 0 refills | Status: DC | PRN
  Filled 2021-12-10: qty 120, 30d supply, fill #0

## 2021-12-11 ENCOUNTER — Other Ambulatory Visit (HOSPITAL_COMMUNITY): Payer: Self-pay

## 2022-01-22 ENCOUNTER — Ambulatory Visit
Admission: RE | Admit: 2022-01-22 | Discharge: 2022-01-22 | Disposition: A | Discharge status: Home / Self Care | Payer: Medicaid Other | Source: Ambulatory Visit | Attending: Pulmonary Disease | Admitting: Pulmonary Disease

## 2022-01-22 DIAGNOSIS — N6320 Unspecified lump in the left breast, unspecified quadrant: Secondary | ICD-10-CM

## 2022-01-28 ENCOUNTER — Other Ambulatory Visit: Payer: Medicaid Other

## 2022-02-12 ENCOUNTER — Other Ambulatory Visit: Payer: Medicaid Other

## 2022-02-18 ENCOUNTER — Ambulatory Visit
Admission: RE | Admit: 2022-02-18 | Discharge: 2022-02-18 | Disposition: A | Discharge status: Home / Self Care | Payer: Medicaid Other | Source: Ambulatory Visit | Attending: Pulmonary Disease | Admitting: Pulmonary Disease

## 2022-03-08 ENCOUNTER — Other Ambulatory Visit (HOSPITAL_COMMUNITY): Payer: Self-pay

## 2022-03-08 ENCOUNTER — Other Ambulatory Visit: Payer: Self-pay

## 2022-03-08 MED ORDER — OXYCODONE-ACETAMINOPHEN 10-325 MG PO TABS
1.0000 | ORAL_TABLET | Freq: Four times a day (QID) | ORAL | 0 refills | Status: DC | PRN
  Filled 2022-03-08: qty 120, 30d supply, fill #0

## 2022-03-26 ENCOUNTER — Other Ambulatory Visit: Payer: Self-pay | Admitting: Pulmonary Disease

## 2022-03-26 ENCOUNTER — Other Ambulatory Visit: Payer: Medicaid Other

## 2022-03-26 DIAGNOSIS — S0990XA Unspecified injury of head, initial encounter: Secondary | ICD-10-CM

## 2022-03-26 DIAGNOSIS — Z9181 History of falling: Secondary | ICD-10-CM

## 2022-03-28 ENCOUNTER — Ambulatory Visit (HOSPITAL_BASED_OUTPATIENT_CLINIC_OR_DEPARTMENT_OTHER): Payer: Medicaid Other

## 2022-03-28 ENCOUNTER — Encounter (HOSPITAL_BASED_OUTPATIENT_CLINIC_OR_DEPARTMENT_OTHER): Payer: Self-pay

## 2022-03-31 ENCOUNTER — Ambulatory Visit (HOSPITAL_COMMUNITY): Payer: Medicaid Other

## 2022-04-05 ENCOUNTER — Other Ambulatory Visit (HOSPITAL_COMMUNITY): Payer: Self-pay

## 2022-04-05 MED ORDER — CYCLOBENZAPRINE HCL 10 MG PO TABS
5.0000 mg | ORAL_TABLET | Freq: Three times a day (TID) | ORAL | 0 refills | Status: DC | PRN
  Filled 2022-04-05: qty 63, 21d supply, fill #0

## 2022-04-05 MED ORDER — OXYCODONE-ACETAMINOPHEN 10-325 MG PO TABS
1.0000 | ORAL_TABLET | Freq: Four times a day (QID) | ORAL | 0 refills | Status: DC | PRN
  Filled 2022-04-05: qty 120, 30d supply, fill #0

## 2022-04-15 ENCOUNTER — Other Ambulatory Visit: Payer: Self-pay

## 2022-04-15 ENCOUNTER — Emergency Department (HOSPITAL_COMMUNITY)
Admission: EM | Admit: 2022-04-15 | Discharge: 2022-04-15 | Discharge status: Against Medical Advice | Payer: Medicaid Other | Attending: Emergency Medicine | Admitting: Emergency Medicine

## 2022-04-15 ENCOUNTER — Emergency Department (HOSPITAL_COMMUNITY): Payer: Medicaid Other

## 2022-04-15 ENCOUNTER — Encounter (HOSPITAL_COMMUNITY): Payer: Self-pay | Admitting: *Deleted

## 2022-04-15 DIAGNOSIS — Z79899 Other long term (current) drug therapy: Secondary | ICD-10-CM | POA: Diagnosis not present

## 2022-04-15 DIAGNOSIS — Z7984 Long term (current) use of oral hypoglycemic drugs: Secondary | ICD-10-CM | POA: Diagnosis not present

## 2022-04-15 DIAGNOSIS — Z5329 Procedure and treatment not carried out because of patient's decision for other reasons: Secondary | ICD-10-CM | POA: Diagnosis not present

## 2022-04-15 DIAGNOSIS — Z794 Long term (current) use of insulin: Secondary | ICD-10-CM | POA: Diagnosis not present

## 2022-04-15 DIAGNOSIS — I10 Essential (primary) hypertension: Secondary | ICD-10-CM | POA: Diagnosis not present

## 2022-04-15 DIAGNOSIS — R200 Anesthesia of skin: Secondary | ICD-10-CM

## 2022-04-15 DIAGNOSIS — R531 Weakness: Secondary | ICD-10-CM | POA: Diagnosis not present

## 2022-04-15 DIAGNOSIS — Z7951 Long term (current) use of inhaled steroids: Secondary | ICD-10-CM | POA: Diagnosis not present

## 2022-04-15 DIAGNOSIS — R519 Headache, unspecified: Secondary | ICD-10-CM | POA: Diagnosis present

## 2022-04-15 DIAGNOSIS — J449 Chronic obstructive pulmonary disease, unspecified: Secondary | ICD-10-CM | POA: Diagnosis not present

## 2022-04-15 DIAGNOSIS — E119 Type 2 diabetes mellitus without complications: Secondary | ICD-10-CM | POA: Insufficient documentation

## 2022-04-15 DIAGNOSIS — R479 Unspecified speech disturbances: Secondary | ICD-10-CM | POA: Diagnosis not present

## 2022-04-15 DIAGNOSIS — W19XXXA Unspecified fall, initial encounter: Secondary | ICD-10-CM

## 2022-04-15 LAB — CBC WITH DIFFERENTIAL/PLATELET
Abs Immature Granulocytes: 0.03 10*3/uL (ref 0.00–0.07)
Basophils Absolute: 0 10*3/uL (ref 0.0–0.1)
Basophils Relative: 0 %
Eosinophils Absolute: 0.1 10*3/uL (ref 0.0–0.5)
Eosinophils Relative: 1 %
HCT: 39.5 % (ref 36.0–46.0)
Hemoglobin: 12.5 g/dL (ref 12.0–15.0)
Immature Granulocytes: 0 %
Lymphocytes Relative: 26 %
Lymphs Abs: 2.5 10*3/uL (ref 0.7–4.0)
MCH: 28.5 pg (ref 26.0–34.0)
MCHC: 31.6 g/dL (ref 30.0–36.0)
MCV: 90.2 fL (ref 80.0–100.0)
Monocytes Absolute: 0.8 10*3/uL (ref 0.1–1.0)
Monocytes Relative: 8 %
Neutro Abs: 6 10*3/uL (ref 1.7–7.7)
Neutrophils Relative %: 65 %
Platelets: 263 10*3/uL (ref 150–400)
RBC: 4.38 MIL/uL (ref 3.87–5.11)
RDW: 14.1 % (ref 11.5–15.5)
WBC: 9.4 10*3/uL (ref 4.0–10.5)
nRBC: 0 % (ref 0.0–0.2)

## 2022-04-15 LAB — COMPREHENSIVE METABOLIC PANEL
ALT: 18 U/L (ref 0–44)
AST: 19 U/L (ref 15–41)
Albumin: 3.8 g/dL (ref 3.5–5.0)
Alkaline Phosphatase: 45 U/L (ref 38–126)
Anion gap: 9 (ref 5–15)
BUN: 9 mg/dL (ref 6–20)
CO2: 28 mmol/L (ref 22–32)
Calcium: 9.2 mg/dL (ref 8.9–10.3)
Chloride: 102 mmol/L (ref 98–111)
Creatinine, Ser: 0.76 mg/dL (ref 0.44–1.00)
GFR, Estimated: >60 mL/min (ref >60)
Glucose, Bld: 132 mg/dL — ABNORMAL HIGH (ref 70–99)
Potassium: 4 mmol/L (ref 3.5–5.1)
Sodium: 139 mmol/L (ref 135–145)
Total Bilirubin: 0.5 mg/dL (ref 0.3–1.2)
Total Protein: 7.3 g/dL (ref 6.5–8.1)

## 2022-04-15 LAB — URINALYSIS, ROUTINE W REFLEX MICROSCOPIC
Bilirubin Urine: NEGATIVE
Glucose, UA: NEGATIVE mg/dL
Hgb urine dipstick: NEGATIVE
Ketones, ur: NEGATIVE mg/dL
Leukocytes,Ua: NEGATIVE
Nitrite: NEGATIVE
Protein, ur: 30 mg/dL — AB
Specific Gravity, Urine: 1.013 (ref 1.005–1.030)
pH: 7 (ref 5.0–8.0)

## 2022-04-15 LAB — RAPID URINE DRUG SCREEN, HOSP PERFORMED
Amphetamines: NOT DETECTED
Barbiturates: NOT DETECTED
Benzodiazepines: POSITIVE — AB
Cocaine: NOT DETECTED
Opiates: NOT DETECTED
Tetrahydrocannabinol: POSITIVE — AB

## 2022-04-15 LAB — CBG MONITORING, ED: Glucose-Capillary: 121 mg/dL — ABNORMAL HIGH (ref 70–99)

## 2022-04-15 LAB — I-STAT BETA HCG BLOOD, ED (MC, WL, AP ONLY): I-stat hCG, quantitative: <5 m[IU]/mL (ref <5)

## 2022-04-15 LAB — ETHANOL: Alcohol, Ethyl (B): <10 mg/dL (ref <10)

## 2022-04-15 LAB — AMMONIA: Ammonia: 35 umol/L (ref 9–35)

## 2022-04-15 LAB — TSH: TSH: 1.99 u[IU]/mL (ref 0.350–4.500)

## 2022-04-15 NOTE — ED Notes (Signed)
Patient transported to MRI 

## 2022-04-15 NOTE — ED Triage Notes (Signed)
BIB [facility] from home s/p unwitnessed mechanical fall in b/r, states "knees buckled", hit L forehead, denies blood thinners or LOC, A&Ox4. Fall heard by family, but not seen. BP 160s & 170s palpated. NSR on monitor. Reports speech as baseline stuttering and balance issues declining over last few weeks. C/o pain l forehead, no wounds. Transitioned to stretcher with assistance. Alert, NAD, calm, interactive.

## 2022-04-15 NOTE — Discharge Instructions (Signed)
Your history, exam, and evaluation today are very concerning to me for intracranial injury, stroke, MS, or intracranial bleed with the recent head trauma and the persistent left-sided neurologic deficits.  We were going to get both CTA imaging of your head and neck as well as MRIs of your head and neck to rule out these concerning findings as directed by neurology however you wanted to leave AGAINST MEDICAL ADVICE.  We strongly recommend against this however as you are answering questions appropriately and understand the risks of leaving, please consider return if you change your mind.

## 2022-04-15 NOTE — ED Provider Notes (Signed)
[place] [facility]MENT AT [facility] Provider Note   CSN: [id number] Arrival date & time: 04/15/22  1032     History  Chief Complaint  Patient presents with   Andrea Montgomery    Andrea Montgomery is a 47 y.o. female.  The history is provided by the patient and medical records. No language interpreter was used.  Fall This is a recurrent problem. The current episode started 6 to 12 hours ago. The problem occurs rarely. The problem has not changed since onset.Associated symptoms include headaches. Pertinent negatives include no chest pain, no abdominal pain and no shortness of breath. Nothing aggravates the symptoms. Nothing relieves the symptoms. She has tried nothing for the symptoms. The treatment provided no relief.       Home Medications Prior to Admission medications   Medication Sig Start Date End Date Taking? Authorizing Provider  albuterol (PROVENTIL HFA;VENTOLIN HFA) 108 (90 BASE) MCG/ACT inhaler Inhale 2 puffs into the lungs every 6 (six) hours as needed for wheezing or shortness of breath.     [provider]  ALPRAZolam Prudy Feeler(XANAX) 1 MG tablet Take 1 mg by mouth 3 (three) times daily.    [provider]  chlorhexidine (PERIDEX) 0.12 % solution Use as directed 15 mLs in the mouth or throat 2 (two) times daily. 10/30/21   Ocie DoyneJensen, Scott, DMD  clindamycin (CLEOCIN) 300 MG capsule Take 300 mg by mouth 3 (three) times daily.    [provider]  clindamycin (CLEOCIN) 300 MG capsule Take 1 capsule (300 mg total) by mouth 3 (three) times daily. 10/30/21   Ocie DoyneJensen, Scott, DMD  cyclobenzaprine (FLEXERIL) 10 MG tablet Take 0.5-1 tablets (5-10 mg total) by mouth 3 (three) times daily as needed for muscle spasms 11/11/21     cyclobenzaprine (FLEXERIL) 10 MG tablet Take 1/2-1 tablet (5-10 mg total) by mouth 3 (three) times daily as needed for muscle spasms 12/10/21     cyclobenzaprine (FLEXERIL) 10 MG tablet Take 1/2-1 tablet (5-10 mg total) by mouth 3 (three)  times daily as needed for muscle spasms. 04/05/22     Fluticasone-Salmeterol (ADVAIR) 250-50 MCG/DOSE AEPB Inhale 1 puff into the lungs daily. For shortness of breath    [provider]  furosemide (LASIX) 40 MG tablet Take 40 mg by mouth 2 (two) times daily.    [provider]  ibuprofen (ADVIL,MOTRIN) 800 MG tablet Take 1 tablet (800 mg total) by mouth 3 (three) times daily. Patient taking differently: Take 800 mg by mouth 2 (two) times daily. 04/29/17   Brock BadHarper, Charles A, MD  Ibuprofen-diphenhydrAMINE Cit (ADVIL PM) 200-38 MG TABS Take 1 tablet by mouth at bedtime.    [provider]  insulin aspart protamine-insulin aspart (NOVOLOG 70/30) (70-30) 100 UNIT/ML injection Inject 15 Units into the skin 2 (two) times daily with a meal. Take 15 units in the morning 10 units at lunch and bedtime  4 time daily If blood sugar is over 400    [provider]  insulin glargine (LANTUS) 100 UNIT/ML injection Inject 12 Units into the skin at bedtime.    [provider]  ipratropium-albuterol (DUONEB) 0.5-2.5 (3) MG/3ML SOLN Take 3 mLs by nebulization every 4 (four) hours as needed (shortness of breath). 03/05/21   [provider]  metFORMIN (GLUCOPHAGE) 1000 MG tablet Take 1,000 mg by mouth daily with breakfast.    [provider]  naloxone (NARCAN) nasal spray 4 mg/0.1 mL SMARTSIG:Both Nares 05/14/21   [provider]  oxyCODONE-acetaminophen (PERCOCET)  10-325 MG tablet Take 1 tablet by mouth every 6 (six) hours as needed for pain. 10/30/21   Ocie Doyne, DMD  oxyCODONE-acetaminophen (PERCOCET) 10-325 MG tablet Take 1 tablet by mouth every 4 (four) hours as needed for pain. 10/30/21   Ocie Doyne, DMD  oxyCODONE-acetaminophen (PERCOCET) 10-325 MG tablet Take 1 tablet by mouth 4 (four) times daily as needed for pain. 04/05/22     polyethylene glycol (MIRALAX) packet Take 17 g by mouth daily. Patient taking differently: Take 17 g by mouth daily  as needed for mild constipation. 09/23/17   Elson Areas, PA-C  Vitamin D, Ergocalciferol, (DRISDOL) 1.25 MG (50000 UNIT) CAPS capsule Take 1 capsule (50,000 Units total) by mouth every 7 (seven) days. Patient taking differently: Take 50,000 Units by mouth once a week. Saturday or sunday 05/06/20   Levert Feinstein, MD      Allergies    Penicillins, Flovent hfa [fluticasone], and Gabapentin    Review of Systems   Review of Systems  Constitutional:  Negative for chills, fatigue and fever.  HENT:  Negative for congestion.   Eyes:  Negative for visual disturbance.  Respiratory:  Negative for apnea, cough, shortness of breath and wheezing.   Cardiovascular:  Negative for chest pain.  Gastrointestinal:  Negative for abdominal pain, constipation, diarrhea, nausea and vomiting.  Genitourinary:  Negative for dysuria, flank pain and frequency.  Musculoskeletal:  Negative for back pain and neck pain.  Skin:  Negative for rash and wound.  Neurological:  Positive for speech difficulty, weakness, numbness and headaches. Negative for dizziness, seizures, facial asymmetry and light-headedness.  Psychiatric/Behavioral:  Negative for agitation and confusion.   All other systems reviewed and are negative.   Physical Exam Updated Vital Signs BP (!) 172/119   Pulse 80   Temp 98.5 F (36.9 C) (Oral)   Resp 15   Wt 95.3 kg   LMP  (Approximate) Comment: 2 months ago, ~february 2024, perimenopause, tubal ligation  SpO2 97%   BMI 39.68 kg/m  Physical Exam Vitals and nursing note reviewed.  Constitutional:      General: She is not in acute distress.    Appearance: She is well-developed. She is not ill-appearing, toxic-appearing or diaphoretic.  HENT:     Head: Normocephalic and atraumatic.     Nose: No congestion or rhinorrhea.     Mouth/Throat:     Pharynx: No oropharyngeal exudate or posterior oropharyngeal erythema.  Eyes:     Extraocular Movements: Extraocular movements intact.      Conjunctiva/sclera: Conjunctivae normal.     Pupils: Pupils are equal, round, and reactive to light.  Neck:     Vascular: No carotid bruit.  Cardiovascular:     Rate and Rhythm: Normal rate and regular rhythm.     Heart sounds: No murmur heard. Pulmonary:     Effort: Pulmonary effort is normal. No respiratory distress.     Breath sounds: Normal breath sounds. No wheezing, rhonchi or rales.  Chest:     Chest wall: No tenderness.  Abdominal:     General: Abdomen is flat.     Palpations: Abdomen is soft.     Tenderness: There is no abdominal tenderness. There is no right CVA tenderness, left CVA tenderness, guarding or rebound.  Musculoskeletal:        General: No swelling or tenderness.     Cervical back: Neck supple. No tenderness.     Right lower leg: No edema.     Left lower leg: No edema.  Skin:    General: Skin is warm and dry.     Capillary Refill: Capillary refill takes less than 2 seconds.     Findings: No erythema or rash.  Neurological:     Mental Status: She is alert.     Sensory: Sensory deficit present.     Motor: Weakness present.  Psychiatric:        Mood and Affect: Mood normal.     ED Results / Procedures / Treatments   Labs (all labs ordered are listed, but only abnormal results are displayed) Labs Reviewed  COMPREHENSIVE METABOLIC PANEL - Abnormal; Notable for the following components:      Result Value   Glucose, Bld 132 (*)    All other components within normal limits  RAPID URINE DRUG SCREEN, HOSP PERFORMED - Abnormal; Notable for the following components:   Benzodiazepines POSITIVE (*)    Tetrahydrocannabinol POSITIVE (*)    All other components within normal limits  URINALYSIS, ROUTINE W REFLEX MICROSCOPIC - Abnormal; Notable for the following components:   Protein, ur 30 (*)    Bacteria, UA RARE (*)    All other components within normal limits  CBG MONITORING, ED - Abnormal; Notable for the following components:   Glucose-Capillary 121 (*)     All other components within normal limits  CBC WITH DIFFERENTIAL/PLATELET  ETHANOL  AMMONIA  TSH  I-STAT BETA HCG BLOOD, ED ([facility], [facility], [facility] ONLY)    EKG EKG Interpretation  Date/Time:  Thursday April 15 2022 10:35:17 EDT Ventricular Rate:  77 PR Interval:  143 QRS Duration: 100 QT Interval:  405 QTC Calculation: 459 R Axis:   70 Text Interpretation: Sinus rhythm Nonspecific T abnormalities, lateral leads ST elev, probable normal early repol pattern when compared to prior, previous t wave inversions improved. No STEMI Confirmed by Theda Belfast ([id number]) on 04/15/2022 10:41:49 AM  Radiology DG Chest Portable 1 View  Result Date: 04/15/2022 CLINICAL DATA:  Post fall. EXAM: PORTABLE CHEST 1 VIEW COMPARISON:  March 06, 2021 FINDINGS: The heart size and mediastinal contours are within normal limits. Both lungs are clear. The visualized skeletal structures are unremarkable. IMPRESSION: No active disease. Electronically Signed   By: Ted Mcalpine M.D.   On: 04/15/2022 12:28    Procedures Procedures    Medications Ordered in ED Medications - No data to display  ED Course/ Medical Decision Making/ A&P                             Medical Decision Making Amount and/or Complexity of Data Reviewed Labs: ordered. Radiology: ordered.    MADAILEIN DEGUIRE is a 47 y.o. female with a past medical history significant for diabetes with diabetic neuropathy is reported, emphysema, hypertension, COPD, asthma, degenerative disease, and iron deficiency anemia who presents for fall.  According to patient, 2 weeks ago she had a fall where her leg started to give out on her getting up from the toilet in the bathroom and she fell and hit the left side of her head.  She reports since that time she has had pain in her head and neck and has had numbness in her left body and weakness in her left arm and left leg.  She reports that she had the same episode happened this morning and her weakness and numbness  symptoms have worsened.  She is reporting pain in her head and neck.  She says that she is having difficulty with  her speech that seems to be new.  She reports no fevers, chills, congestion, cough, nausea, vomiting, constipation, diarrhea, or urinary changes.  On my exam, lungs were clear.  Chest nontender.  Abdomen was nontender on my exam.  Bowel sounds appreciated.  Patient had numbness in her left face, left arm, and left leg and had weakness in her left arm and left leg.  She cannot do finger-nose-finger testing with the left arm but does it with the right side.  Patient did not want to look for I was asking her to look with her eyes but eventually was able to do so with intact extraocular he was.  Pupils were 3 mm and reactive bilaterally.  Speech was difficult to understand and was somewhat dysarthric at times.  She was stuttering some and may have been some mild aphasia.  Patient is having a difficult time telling me if this is worse than her baseline for speech.  I did not appreciate carotid bruit.  No other evidence of acute trauma initially.  Clinically I do feel need to get imaging of her head and neck as well as other lab workup.  I spoke to neurology from the start who recommended CTA head and neck to rule out dissection or bleed and then MRI brain with and without contrast of her head and neck to look for stroke or MS causing the symptoms.  We will get other screening labs as well.  Anticipate reassessment after workup to determine disposition.   2:26 PM Patient's workup began to return.  Her urinalysis is not consistent with UTI.  CBC and CMP overall reassuring.  EtOH negative.  Ammonia normal.  TSH not elevated.  She is not pregnant. she had chest x-ray that showed no active disease.  We initially ordered the CTA head and neck as well as the MRI brain however patient says she wants to leave AMA and does not want any imaging.  She reports she has too much anxiety and did not want me to give  her anxiety medicine to get the images.  She agrees with leaving AGAINST MEDICAL ADVICE.  Patient will be leaving AGAINST MEDICAL ADVICE.  We discussed that there could be stroke, MS, intracranial hemorrhage, or other concerning finding however she says she will return if she changes her mind.  Patient left AMA for outpatient follow-up.         Final Clinical Impression(s) / ED Diagnoses Final diagnoses:  Fall, initial encounter  Left sided numbness  Left-sided weakness  Speech problem    Clinical Impression: 1. Fall, initial encounter   2. Left sided numbness   3. Left-sided weakness   4. Speech problem     Disposition: AMA    New Prescriptions   No medications on file    Follow Up: No follow-up provider specified.     Ajeet Casasola, Canary Brim, MD 04/15/22 1438

## 2022-04-15 NOTE — ED Notes (Signed)
Pt refused last VS

## 2022-04-16 ENCOUNTER — Other Ambulatory Visit (HOSPITAL_COMMUNITY): Payer: Medicaid Other

## 2022-04-16 ENCOUNTER — Other Ambulatory Visit (HOSPITAL_COMMUNITY): Payer: Self-pay

## 2022-04-20 ENCOUNTER — Ambulatory Visit (HOSPITAL_BASED_OUTPATIENT_CLINIC_OR_DEPARTMENT_OTHER)
Admission: RE | Admit: 2022-04-20 | Discharge: 2022-04-20 | Disposition: A | Discharge status: Home / Self Care | Payer: Medicaid Other | Source: Ambulatory Visit | Attending: Pulmonary Disease | Admitting: Pulmonary Disease

## 2022-04-20 DIAGNOSIS — Z9181 History of falling: Secondary | ICD-10-CM | POA: Diagnosis present

## 2022-04-20 DIAGNOSIS — S0990XA Unspecified injury of head, initial encounter: Secondary | ICD-10-CM | POA: Diagnosis present

## 2022-04-23 ENCOUNTER — Ambulatory Visit (HOSPITAL_COMMUNITY): Payer: Medicaid Other

## 2022-05-05 ENCOUNTER — Other Ambulatory Visit (HOSPITAL_COMMUNITY): Payer: Self-pay

## 2022-05-05 MED ORDER — OXYCODONE-ACETAMINOPHEN 10-325 MG PO TABS
1.0000 | ORAL_TABLET | Freq: Four times a day (QID) | ORAL | 0 refills | Status: DC | PRN
  Filled 2022-05-05: qty 120, 30d supply, fill #0

## 2022-05-06 ENCOUNTER — Other Ambulatory Visit (HOSPITAL_COMMUNITY): Payer: Self-pay

## 2022-05-06 MED ORDER — ERGOCALCIFEROL 1.25 MG (50000 UT) PO CAPS
50000.0000 [IU] | ORAL_CAPSULE | ORAL | 1 refills | Status: AC
  Filled 2022-05-06: qty 12, 84d supply, fill #0

## 2022-05-18 ENCOUNTER — Other Ambulatory Visit (HOSPITAL_COMMUNITY): Payer: Self-pay

## 2022-06-04 ENCOUNTER — Other Ambulatory Visit (HOSPITAL_COMMUNITY): Payer: Self-pay

## 2022-06-04 MED ORDER — OXYCODONE-ACETAMINOPHEN 10-325 MG PO TABS
1.0000 | ORAL_TABLET | Freq: Four times a day (QID) | ORAL | 0 refills | Status: DC | PRN
  Filled 2022-06-04: qty 120, 30d supply, fill #0

## 2022-06-04 MED ORDER — CYCLOBENZAPRINE HCL 10 MG PO TABS
5.0000 mg | ORAL_TABLET | Freq: Three times a day (TID) | ORAL | 0 refills | Status: DC
  Filled 2022-06-04: qty 63, 21d supply, fill #0

## 2022-06-07 ENCOUNTER — Other Ambulatory Visit (HOSPITAL_BASED_OUTPATIENT_CLINIC_OR_DEPARTMENT_OTHER): Payer: Self-pay

## 2022-06-07 DIAGNOSIS — G4730 Sleep apnea, unspecified: Secondary | ICD-10-CM

## 2022-06-30 ENCOUNTER — Other Ambulatory Visit (HOSPITAL_COMMUNITY): Payer: Self-pay

## 2022-06-30 MED ORDER — OXYCODONE-ACETAMINOPHEN 10-325 MG PO TABS
1.0000 | ORAL_TABLET | Freq: Four times a day (QID) | ORAL | 0 refills | Status: DC | PRN
  Filled 2022-06-30 – 2022-07-02 (×2): qty 120, 30d supply, fill #0

## 2022-07-02 ENCOUNTER — Other Ambulatory Visit (HOSPITAL_COMMUNITY): Payer: Self-pay

## 2022-07-05 ENCOUNTER — Encounter: Payer: Self-pay | Admitting: Internal Medicine

## 2022-07-12 ENCOUNTER — Ambulatory Visit (HOSPITAL_BASED_OUTPATIENT_CLINIC_OR_DEPARTMENT_OTHER): Payer: Medicaid Other | Attending: Nurse Practitioner | Admitting: Internal Medicine

## 2022-07-12 DIAGNOSIS — R0683 Snoring: Secondary | ICD-10-CM | POA: Diagnosis present

## 2022-07-12 DIAGNOSIS — G4730 Sleep apnea, unspecified: Secondary | ICD-10-CM

## 2022-07-16 ENCOUNTER — Ambulatory Visit (AMBULATORY_SURGERY_CENTER): Payer: Medicaid Other | Admitting: *Deleted

## 2022-07-16 VITALS — Ht 61.0 in | Wt 220.0 lb

## 2022-07-16 DIAGNOSIS — Z85038 Personal history of other malignant neoplasm of large intestine: Secondary | ICD-10-CM

## 2022-07-16 DIAGNOSIS — Z1211 Encounter for screening for malignant neoplasm of colon: Secondary | ICD-10-CM

## 2022-07-16 MED ORDER — PEG 3350-KCL-NA BICARB-NACL 420 G PO SOLR
4000.0000 mL | Freq: Once | ORAL | 0 refills | Status: AC
Start: 2022-07-16 — End: 2022-07-16

## 2022-07-16 NOTE — Progress Notes (Signed)

## 2022-07-24 DIAGNOSIS — G4730 Sleep apnea, unspecified: Secondary | ICD-10-CM | POA: Diagnosis not present

## 2022-07-24 NOTE — Procedures (Signed)
    Patient Name: Andrea Montgomery, Andrea Montgomery Date: 07/12/2022 Gender: Female D.O.B: 1975/03/30 Age (years): 47 Referring Provider: Courtney Paris NP Height (inches): 60 Interpreting Physician: Jetty Duhamel MD, ABSM Weight (lbs): 207 RPSGT: Ulyess Mort BMI: 40 MRN: [id number] Neck Size: 16.00  CLINICAL INFORMATION Sleep Study Type: NPSG Indication for sleep study: COPD, Daytime Fatigue, Depression, Diabetes, Fatigue, Hypertension, Obesity, Snoring Epworth Sleepiness Score: 14  SLEEP STUDY TECHNIQUE As per the AASM Manual for the Scoring of Sleep and Associated Events v2.3 (April 2016) with a hypopnea requiring 4% desaturations.  The channels recorded and monitored were frontal, central and occipital EEG, electrooculogram (EOG), submentalis EMG (chin), nasal and oral airflow, thoracic and abdominal wall motion, anterior tibialis EMG, snore microphone, electrocardiogram, and pulse oximetry.  MEDICATIONS Medications self-administered by patient taken the night of the study : oxycodone-acetaminophen, XANAX  SLEEP ARCHITECTURE The study was initiated at 11:11:25 PM and ended at 5:09:40 AM.  Sleep onset time was 4.7 minutes and the sleep efficiency was 62.9%. The total sleep time was 225.5 minutes.  Stage REM latency was 78.5 minutes.  The patient spent 11.8% of the night in stage N1 sleep, 75.2% in stage N2 sleep, 0.0% in stage N3 and 13.1% in REM.  Alpha intrusion was absent.  Supine sleep was 63.64%.  RESPIRATORY PARAMETERS The overall apnea/hypopnea index (AHI) was 2.9 per hour. There were 0 total apneas, including 0 obstructive, 0 central and 0 mixed apneas. There were 11 hypopneas and 32 RERAs.  The AHI during Stage REM sleep was 12.2 per hour.  AHI while supine was 4.6 per hour.  The mean oxygen saturation was 96.0%. The minimum SpO2 during sleep was 90.0%.  moderate snoring was noted during this study.  CARDIAC DATA The 2 lead EKG demonstrated sinus rhythm. The mean  heart rate was 82.8 beats per minute. Other EKG findings include: None.  LEG MOVEMENT DATA The total PLMS were 0 with a resulting PLMS index of 0.0. Associated arousal with leg movement index was 0.0 .  IMPRESSIONS - No significant obstructive sleep apnea occurred during this study (AHI = 2.9/h). - The patient had minimal or no oxygen desaturation during the study (Min O2 = 90.0%) - The patient snored with moderate snoring volume. - No cardiac abnormalities were noted during this study- frequent PACs. - Clinically significant periodic limb movements did not occur during sleep. No significant associated arousals. - Patient was awake between 03:00 and 04:45, and reported taking oxycodone and xanax ar 03:30 for back pain. Bathroom x 2.  DIAGNOSIS - Primary Snoring  RECOMMENDATIONS - Manage for snoring and symptoms based on clinical judgment. - Sleep hygiene should be reviewed to assess factors that may improve sleep quality. - Weight management and regular exercise should be initiated or continued if appropriate.  [Electronically signed] 07/24/2022 11:58 AM  Jetty Duhamel MD, ABSM Diplomate, American Board of Sleep Medicine NPI: [id number]                         Jetty Duhamel Diplomate, American Board of Sleep Medicine  ELECTRONICALLY SIGNED ON:  07/24/2022, 11:54 AM [facility] PH: [phone]   FX: [phone] ACCREDITED BY THE AMERICAN ACADEMY OF SLEEP MEDICINE

## 2022-07-27 ENCOUNTER — Telehealth: Payer: Self-pay | Admitting: *Deleted

## 2022-07-27 ENCOUNTER — Telehealth: Payer: Self-pay | Admitting: Internal Medicine

## 2022-07-27 NOTE — Telephone Encounter (Signed)
Patient called requesting to speak with a nurse stated she is scheduled for a procedure and is scared, has questions regarding her diabetes.

## 2022-07-27 NOTE — Telephone Encounter (Signed)
Pt scared and has multiple questions. 1st Discussed the prep instructions and provided emotional support.Intrusted pt to let RN;s know on day of procedure that she is scared too death.Instructed not to take xanax day of procedure. Pt states she will.

## 2022-07-29 ENCOUNTER — Ambulatory Visit (AMBULATORY_SURGERY_CENTER): Payer: Medicaid Other | Admitting: Internal Medicine

## 2022-07-29 ENCOUNTER — Encounter: Payer: Self-pay | Admitting: Internal Medicine

## 2022-07-29 VITALS — BP 179/81 | HR 98 | Temp 98.2°F | Resp 18 | Ht 61.0 in | Wt 220.0 lb

## 2022-07-29 DIAGNOSIS — Z1211 Encounter for screening for malignant neoplasm of colon: Secondary | ICD-10-CM

## 2022-07-29 MED ORDER — LINACLOTIDE 145 MCG PO CAPS
145.0000 ug | ORAL_CAPSULE | Freq: Every day | ORAL | 1 refills | Status: DC
Start: 2022-07-29 — End: 2022-11-15

## 2022-07-29 MED ORDER — SODIUM CHLORIDE 0.9 % IV SOLN
500.0000 mL | Freq: Once | INTRAVENOUS | Status: DC
Start: 2022-07-29 — End: 2022-07-29

## 2022-07-29 NOTE — Progress Notes (Signed)
Pt extremely nervous upon admitting.    Pt's states no medical or surgical changes since previsit or office visit.

## 2022-07-29 NOTE — Telephone Encounter (Signed)
Call to clarify questions.

## 2022-07-29 NOTE — Progress Notes (Signed)
Vss nad trans to pacu 

## 2022-07-29 NOTE — Op Note (Addendum)
[facility] Patient Name: Andrea Montgomery Procedure Date: 07/29/2022 8:37 AM MRN: [id number] Endoscopist: Madelyn Brunner [place] , , [id number] Age: 47 Referring MD:  Date of Birth: 1975-12-08 Gender: Female Account #: [id number] Procedure:                Colonoscopy Indications:              Screening for colorectal malignant neoplasm, This                            is the patient's first colonoscopy Medicines:                Monitored Anesthesia Care Procedure:                Pre-Anesthesia Assessment:                           - Prior to the procedure, a History and Physical                            was performed, and patient medications and                            allergies were reviewed. The patient's tolerance of                            previous anesthesia was also reviewed. The risks                            and benefits of the procedure and the sedation                            options and risks were discussed with the patient.                            All questions were answered, and informed consent                            was obtained. Prior Anticoagulants: The patient has                            taken no anticoagulant or antiplatelet agents. ASA                            Grade Assessment: III - A patient with severe                            systemic disease. After reviewing the risks and                            benefits, the patient was deemed in satisfactory                            condition to undergo the procedure.  After obtaining informed consent, the colonoscope                            was passed under direct vision. Throughout the                            procedure, the patient's blood pressure, pulse, and                            oxygen saturations were monitored continuously. The                            CF [id number] #[id number] was introduced through the anus                            and advanced  to the the terminal ileum. The                            colonoscopy was performed without difficulty. The                            patient tolerated the procedure well. The quality                            of the bowel preparation was fair. The terminal                            ileum, ileocecal valve, appendiceal orifice, and                            rectum were photographed. Scope In: 8:43:41 AM Scope Out: 9:09:57 AM Scope Withdrawal Time: 0 hours 19 minutes 33 seconds  Total Procedure Duration: 0 hours 26 minutes 16 seconds  Findings:                 The terminal ileum appeared normal.                           Semi-solid stool was found in the ascending colon                            and in the cecum, making visualization difficult.                           Non-bleeding internal hemorrhoids were found during                            retroflexion. Complications:            No immediate complications. Estimated Blood Loss:     Estimated blood loss: none. Impression:               - Preparation of the colon was fair.                           - The examined portion of  the ileum was normal.                           - Stool in the ascending colon and in the cecum.                           - Non-bleeding internal hemorrhoids.                           - No specimens collected. Recommendation:           - Discharge patient to home (with escort).                           - Repeat colonoscopy in 3 years with a two day                            preparation because the bowel preparation was                            suboptimal.                           - Start Linzess 145 mcg QD.                           - Follow up in GI clinic in 3 months.                           - The findings and recommendations were discussed                            with the patient. Dr Particia Lather "[name]" Leonides Schanz,  07/29/2022 9:13:58 AM

## 2022-07-29 NOTE — Progress Notes (Signed)
GASTROENTEROLOGY PROCEDURE H&P NOTE   Primary Care Physician: Corine Shelter, MD    Reason for Procedure:   Colon cancer screening  Plan:    Colonoscopy  Patient is appropriate for endoscopic procedure(s) in the ambulatory ([facility]) setting.  The nature of the procedure, as well as the risks, benefits, and alternatives were carefully and thoroughly reviewed with the patient. Ample time for discussion and questions allowed. The patient understood, was satisfied, and agreed to proceed.     HPI: Andrea Montgomery is a 47 y.o. female who presents for colonoscopy for colon cancer screening. She does occasionally see scant amounts of rectal bleeding. She does have constipation issues normally and takes stool softeners. He takes Percocet regularly. Denies family history of colon cancer.  Past Medical History:  Diagnosis Date   Anxiety    Asthma    COPD (chronic obstructive pulmonary disease) (HCC)    Degenerative disc disease, lumbar    Depression    Diabetes mellitus    Emphysema of lung (HCC)    Hypertension    Iron deficiency anemia 03/12/2014   Leg weakness    Low back pain    Pneumonia    PONV (postoperative nausea and vomiting)    Sleep apnea     Past Surgical History:  Procedure Laterality Date   ALVEOLOPLASTY Bilateral 10/30/2021   Procedure: ALVEOLOPLASTY;  Surgeon: Ocie Doyne, DMD;  Location: [facility] OR;  Service: Oral Surgery;  Laterality: Bilateral;   ceserean section times 2     DILITATION & CURRETTAGE/HYSTROSCOPY WITH HYDROTHERMAL ABLATION N/A 10/25/2014   Procedure: DILATATION & CURETTAGE/HYSTEROSCOPY WITH HYDROTHERMAL ABLATION;  Surgeon: Brock Bad, MD;  Location: [facility] ORS;  Service: Gynecology;  Laterality: N/A;   TOOTH EXTRACTION N/A 10/30/2021   Procedure: DENTAL RESTORATION/EXTRACTIONS of 3,5,6,8,9,11,12,14,18,19,20,22,23,25,26,27,28,30,31;  Surgeon: Ocie Doyne, DMD;  Location: [facility] OR;  Service: Oral Surgery;  Laterality: N/A;   TUBAL LIGATION      tumor removed from leg      Prior to Admission medications   Medication Sig Start Date End Date Taking? Authorizing Provider  albuterol (PROVENTIL HFA;VENTOLIN HFA) 108 (90 BASE) MCG/ACT inhaler Inhale 2 puffs into the lungs every 6 (six) hours as needed for wheezing or shortness of breath.    Yes [provider]  ALPRAZolam Prudy Feeler) 1 MG tablet Take 1 mg by mouth 3 (three) times daily.   Yes [provider]  ergocalciferol (VITAMIN D2) 1.25 MG (50000 UT) capsule Take 1 capsule (50,000 Units total) by mouth once a week. 05/06/22  Yes   Fluticasone-Salmeterol (ADVAIR) 250-50 MCG/DOSE AEPB Inhale 1 puff into the lungs daily. For shortness of breath   Yes [provider]  furosemide (LASIX) 40 MG tablet Take 40 mg by mouth 2 (two) times daily.   Yes [provider]  losartan (COZAAR) 100 MG tablet Take 100 mg by mouth daily. 04/27/22  Yes [provider]  metFORMIN (GLUCOPHAGE) 1000 MG tablet Take 1,000 mg by mouth daily with breakfast.   Yes [provider]  oxyCODONE-acetaminophen (PERCOCET) 10-325 MG tablet Take 1 tablet by mouth every 6 (six) hours as needed for pain. 10/30/21  Yes Ocie Doyne, DMD  polyethylene glycol (MIRALAX) packet Take 17 g by mouth daily. Patient taking differently: Take 17 g by mouth daily as needed for mild constipation. 09/23/17  Yes Cheron Schaumann K, PA-C  chlorhexidine (PERIDEX) 0.12 % solution Use as directed 15 mLs in the mouth or throat 2 (two) times daily. Patient not taking: Reported on 07/16/2022  10/30/21   Ocie Doyne, DMD  clindamycin (CLEOCIN) 300 MG capsule Take 300 mg by mouth 3 (three) times daily. Patient not taking: Reported on 07/16/2022    [provider]  cyclobenzaprine (FLEXERIL) 10 MG tablet Take 0.5-1 tablets (5-10 mg total) by mouth 3 (three) times daily as needed for muscle spasms 11/11/21     cyclobenzaprine (FLEXERIL) 10 MG tablet Take 1/2-1 tablet (5-10 mg total) by mouth 3 (three)  times daily as needed for muscle spasms 12/10/21     cyclobenzaprine (FLEXERIL) 10 MG tablet Take 1/2-1 tablet (5-10 mg total) by mouth 3 (three) times daily as needed for muscle spasms. 04/05/22     cyclobenzaprine (FLEXERIL) 10 MG tablet Take 1/2-1 tablet (5-10 mg total) by mouth 3 (three) times daily  as needed for muscle spasms 06/04/22     ibuprofen (ADVIL,MOTRIN) 800 MG tablet Take 1 tablet (800 mg total) by mouth 3 (three) times daily. Patient taking differently: Take 800 mg by mouth 2 (two) times daily. 04/29/17   Brock Bad, MD  insulin aspart protamine-insulin aspart (NOVOLOG 70/30) (70-30) 100 UNIT/ML injection Inject 15 Units into the skin 2 (two) times daily with a meal. Take 15 units in the morning 10 units at lunch and bedtime  4 time daily If blood sugar is over 400    [provider]  insulin glargine (LANTUS) 100 UNIT/ML injection Inject 12 Units into the skin at bedtime.    [provider]  ipratropium-albuterol (DUONEB) 0.5-2.5 (3) MG/3ML SOLN Take 3 mLs by nebulization every 4 (four) hours as needed (shortness of breath). 03/05/21   [provider]  naloxone Jonelle Sports) nasal spray 4 mg/0.1 mL SMARTSIG:Both Nares Patient not taking: Reported on 07/29/2022 05/14/21   [provider]    Current Outpatient Medications  Medication Sig Dispense Refill   albuterol (PROVENTIL HFA;VENTOLIN HFA) 108 (90 BASE) MCG/ACT inhaler Inhale 2 puffs into the lungs every 6 (six) hours as needed for wheezing or shortness of breath.      ALPRAZolam (XANAX) 1 MG tablet Take 1 mg by mouth 3 (three) times daily.     ergocalciferol (VITAMIN D2) 1.25 MG (50000 UT) capsule Take 1 capsule (50,000 Units total) by mouth once a week. 12 capsule 1   Fluticasone-Salmeterol (ADVAIR) 250-50 MCG/DOSE AEPB Inhale 1 puff into the lungs daily. For shortness of breath     furosemide (LASIX) 40 MG tablet Take 40 mg by mouth 2 (two) times daily.     losartan (COZAAR) 100 MG tablet Take  100 mg by mouth daily.     metFORMIN (GLUCOPHAGE) 1000 MG tablet Take 1,000 mg by mouth daily with breakfast.     oxyCODONE-acetaminophen (PERCOCET) 10-325 MG tablet Take 1 tablet by mouth every 6 (six) hours as needed for pain. 30 tablet 0   polyethylene glycol (MIRALAX) packet Take 17 g by mouth daily. (Patient taking differently: Take 17 g by mouth daily as needed for mild constipation.) 14 each 0   chlorhexidine (PERIDEX) 0.12 % solution Use as directed 15 mLs in the mouth or throat 2 (two) times daily. (Patient not taking: Reported on 07/16/2022) 120 mL 0   clindamycin (CLEOCIN) 300 MG capsule Take 300 mg by mouth 3 (three) times daily. (Patient not taking: Reported on 07/16/2022)     cyclobenzaprine (FLEXERIL) 10 MG tablet Take 0.5-1 tablets (5-10 mg total) by mouth 3 (three) times daily as needed for muscle spasms 90 tablet 0   cyclobenzaprine (FLEXERIL) 10 MG tablet Take 1/2-1  tablet (5-10 mg total) by mouth 3 (three) times daily as needed for muscle spasms 90 tablet 0   cyclobenzaprine (FLEXERIL) 10 MG tablet Take 1/2-1 tablet (5-10 mg total) by mouth 3 (three) times daily as needed for muscle spasms. 90 tablet 0   cyclobenzaprine (FLEXERIL) 10 MG tablet Take 1/2-1 tablet (5-10 mg total) by mouth 3 (three) times daily  as needed for muscle spasms 90 tablet 0   ibuprofen (ADVIL,MOTRIN) 800 MG tablet Take 1 tablet (800 mg total) by mouth 3 (three) times daily. (Patient taking differently: Take 800 mg by mouth 2 (two) times daily.) 30 tablet 5   insulin aspart protamine-insulin aspart (NOVOLOG 70/30) (70-30) 100 UNIT/ML injection Inject 15 Units into the skin 2 (two) times daily with a meal. Take 15 units in the morning 10 units at lunch and bedtime  4 time daily If blood sugar is over 400     insulin glargine (LANTUS) 100 UNIT/ML injection Inject 12 Units into the skin at bedtime.     ipratropium-albuterol (DUONEB) 0.5-2.5 (3) MG/3ML SOLN Take 3 mLs by nebulization every 4 (four) hours as needed  (shortness of breath).     naloxone (NARCAN) nasal spray 4 mg/0.1 mL SMARTSIG:Both Nares (Patient not taking: Reported on 07/29/2022)     Current Facility-Administered Medications  Medication Dose Route Frequency Provider Last Rate Last Admin   0.9 %  sodium chloride infusion  500 mL Intravenous Once Imogene Burn, MD        Allergies as of 07/29/2022 - Review Complete 07/29/2022  Allergen Reaction Noted   Penicillins Anaphylaxis 03/04/2021   Flovent hfa [fluticasone]  03/06/2021   Gabapentin Swelling 03/04/2021   Lisinopril  07/29/2022    Family History  Problem Relation Age of Onset   Hypertension Mother    Heart disease Mother    Diabetes Father    Colon polyps Neg Hx    Colon cancer Neg Hx    Esophageal cancer Neg Hx    Rectal cancer Neg Hx    Stomach cancer Neg Hx     Social History   Socioeconomic History   Marital status: Single    Spouse name: Not on file   Number of children: 2   Years of education: college   Highest education level: Associate degree: academic program  Occupational History   Occupation: Disability  Tobacco Use   Smoking status: Every Day    Current packs/day: 0.20    Average packs/day: 0.2 packs/day for 27.0 years (5.4 ttl pk-yrs)    Types: Cigarettes   Smokeless tobacco: Never   Tobacco comments:    2 cigarettes/day  Vaping Use   Vaping status: Never Used  Substance and Sexual Activity   Alcohol use: No    Alcohol/week: 0.0 standard drinks of alcohol   Drug use: No   Sexual activity: Yes    Partners: Male    Birth control/protection: None, Surgical  Other Topics Concern   Not on file  Social History Narrative   Right-handed.   Lives at home with her two children.    No daily caffeine use.   Social Determinants of Health   Financial Resource Strain: Not on file  Food Insecurity: Not on file  Transportation Needs: Not on file  Physical Activity: Not on file  Stress: Not on file  Social Connections: Not on file  Intimate  Partner Violence: Not on file    Physical Exam: Vital signs in last 24 hours: BP (!) 132/116   Pulse  85   Temp 98.2 F (36.8 C)   Ht 5\' 1"  (1.549 m)   Wt 220 lb (99.8 kg)   SpO2 99%   BMI 41.57 kg/m  GEN: NAD EYE: Sclerae anicteric ENT: MMM CV: Non-tachycardic Pulm: No increased work of breathing GI: Soft, NT/ND NEURO:  Alert & Oriented   Eulah Pont, MD [facility]  07/29/2022 8:29 AM

## 2022-07-29 NOTE — Patient Instructions (Addendum)
Pick up prescription from [facility] off of [address] for Linzess 145 mcg daily  Follow up appointment with Dr. Leonides Schanz on November 1st, 2024 at 10:10 am (if this date/time doesn't work then please call the office to reschedule) Resume previous diet and continue present medications  Repeat colonoscopy in 3 years for surveillance   YOU HAD AN ENDOSCOPIC PROCEDURE TODAY AT THE [facility]:   Refer to the procedure report that was given to you for any specific questions about what was found during the examination.  If the procedure report does not answer your questions, please call your gastroenterologist to clarify.  If you requested that your care partner not be given the details of your procedure findings, then the procedure report has been included in a sealed envelope for you to review at your convenience later.  YOU SHOULD EXPECT: Some feelings of bloating in the abdomen. Passage of more gas than usual.  Walking can help get rid of the air that was put into your GI tract during the procedure and reduce the bloating. If you had a lower endoscopy (such as a colonoscopy or flexible sigmoidoscopy) you may notice spotting of blood in your stool or on the toilet paper. If you underwent a bowel prep for your procedure, you may not have a normal bowel movement for a few days.  Please Note:  You might notice some irritation and congestion in your nose or some drainage.  This is from the oxygen used during your procedure.  There is no need for concern and it should clear up in a day or so.  SYMPTOMS TO REPORT IMMEDIATELY:  Following lower endoscopy (colonoscopy or flexible sigmoidoscopy):  Excessive amounts of blood in the stool  Significant tenderness or worsening of abdominal pains  Swelling of the abdomen that is new, acute  Fever of 100F or higher   For urgent or emergent issues, a gastroenterologist can be reached at any hour by calling [phone]. Do not use MyChart  messaging for urgent concerns.    DIET:  We do recommend a small meal at first, but then you may proceed to your regular diet.  Drink plenty of fluids but you should avoid alcoholic beverages for 24 hours.  ACTIVITY:  You should plan to take it easy for the rest of today and you should NOT DRIVE or use heavy machinery until tomorrow (because of the sedation medicines used during the test).    FOLLOW UP: Our staff will call the number listed on your records the next business day following your procedure.  We will call around 7:15- 8:00 am to check on you and address any questions or concerns that you may have regarding the information given to you following your procedure. If we do not reach you, we will leave a message.     If any biopsies were taken you will be contacted by phone or by letter within the next 1-3 weeks.  Please call us at [phone] if you have not heard about the biopsies in 3 weeks.    SIGNATURES/CONFIDENTIALITY: You and/or your care partner have signed paperwork which will be entered into your electronic medical record.  These signatures attest to the fact that that the information above on your After Visit Summary has been reviewed and is understood.  Full responsibility of the confidentiality of this discharge information lies with you and/or your care-partner.

## 2022-07-30 ENCOUNTER — Other Ambulatory Visit (HOSPITAL_COMMUNITY): Payer: Self-pay

## 2022-07-30 ENCOUNTER — Telehealth: Payer: Self-pay | Admitting: *Deleted

## 2022-07-30 MED ORDER — OXYCODONE-ACETAMINOPHEN 10-325 MG PO TABS
1.0000 | ORAL_TABLET | Freq: Four times a day (QID) | ORAL | 0 refills | Status: DC | PRN
Start: 2022-07-30 — End: 2023-09-01
  Filled 2022-07-30: qty 120, 30d supply, fill #0

## 2022-07-30 MED ORDER — CYCLOBENZAPRINE HCL 10 MG PO TABS
5.0000 mg | ORAL_TABLET | Freq: Three times a day (TID) | ORAL | 0 refills | Status: DC | PRN
  Filled 2022-07-30: qty 63, 21d supply, fill #0

## 2022-07-30 NOTE — Telephone Encounter (Signed)
  Follow up Call-     07/29/2022    8:04 AM  Call back number  Post procedure Call Back phone  # [phone]  Permission to leave phone message Yes     Patient questions:  Do you have a fever, pain , or abdominal swelling? No. Pain Score  0 *  Have you tolerated food without any problems? Yes.    Have you been able to return to your normal activities? Yes.    Do you have any questions about your discharge instructions: Diet   No. Medications  No. Follow up visit  No.  Do you have questions or concerns about your Care? No.  Actions: * If pain score is 4 or above: No action needed, pain <4.

## 2022-08-30 ENCOUNTER — Other Ambulatory Visit (HOSPITAL_COMMUNITY): Payer: Self-pay

## 2022-08-30 MED ORDER — NALOXONE HCL 4 MG/0.1ML NA LIQD
1.0000 | NASAL | 3 refills | Status: DC
  Filled 2022-08-30: qty 2, 1d supply, fill #0

## 2022-08-30 MED ORDER — OXYCODONE-ACETAMINOPHEN 10-325 MG PO TABS
1.0000 | ORAL_TABLET | Freq: Four times a day (QID) | ORAL | 0 refills | Status: AC | PRN
Start: 2022-08-30 — End: ?
  Filled 2022-08-30: qty 120, 30d supply, fill #0

## 2022-09-09 ENCOUNTER — Other Ambulatory Visit (HOSPITAL_COMMUNITY): Payer: Self-pay

## 2022-09-28 ENCOUNTER — Other Ambulatory Visit (HOSPITAL_COMMUNITY): Payer: Self-pay

## 2022-11-05 ENCOUNTER — Ambulatory Visit: Payer: Medicaid Other | Admitting: Internal Medicine

## 2022-11-13 ENCOUNTER — Other Ambulatory Visit: Payer: Self-pay | Admitting: Internal Medicine

## 2022-11-13 DIAGNOSIS — Z1211 Encounter for screening for malignant neoplasm of colon: Secondary | ICD-10-CM

## 2023-02-02 ENCOUNTER — Ambulatory Visit: Payer: Medicaid Other | Admitting: Nurse Practitioner

## 2023-02-16 ENCOUNTER — Encounter: Payer: Self-pay | Admitting: Obstetrics

## 2023-02-16 ENCOUNTER — Other Ambulatory Visit (HOSPITAL_COMMUNITY)
Admission: RE | Admit: 2023-02-16 | Discharge: 2023-02-16 | Disposition: A | Discharge status: Home / Self Care | Payer: Medicaid Other | Source: Ambulatory Visit | Attending: Obstetrics | Admitting: Obstetrics

## 2023-02-16 ENCOUNTER — Ambulatory Visit (INDEPENDENT_AMBULATORY_CARE_PROVIDER_SITE_OTHER): Payer: Medicaid Other | Admitting: Obstetrics

## 2023-02-16 VITALS — BP 175/93 | HR 77 | Wt 235.1 lb

## 2023-02-16 DIAGNOSIS — N898 Other specified noninflammatory disorders of vagina: Secondary | ICD-10-CM

## 2023-02-16 DIAGNOSIS — Z01419 Encounter for gynecological examination (general) (routine) without abnormal findings: Secondary | ICD-10-CM

## 2023-02-16 DIAGNOSIS — Z720 Tobacco use: Secondary | ICD-10-CM

## 2023-02-16 DIAGNOSIS — Z113 Encounter for screening for infections with a predominantly sexual mode of transmission: Secondary | ICD-10-CM | POA: Diagnosis not present

## 2023-02-16 DIAGNOSIS — E6609 Other obesity due to excess calories: Secondary | ICD-10-CM

## 2023-02-16 DIAGNOSIS — Z1239 Encounter for other screening for malignant neoplasm of breast: Secondary | ICD-10-CM

## 2023-02-16 NOTE — Progress Notes (Signed)
Pt presents for AEX. Pap today.

## 2023-02-16 NOTE — Progress Notes (Signed)
Subjective:        Andrea Montgomery is a 48 y.o. female here for a routine exam.  Current complaints: Vaginal discharge.    Personal health questionnaire:  Is patient Andrea Montgomery, have a family history of breast and/or ovarian cancer: no Is there a family history of uterine cancer diagnosed at age < 38, gastrointestinal cancer, urinary tract cancer, family member who is a Personnel officer syndrome-associated carrier: no Is the patient overweight and hypertensive, family history of diabetes, personal history of gestational diabetes, preeclampsia or PCOS: yes Is patient over 68, have PCOS,  family history of premature CHD under age 104, diabetes, smoke, have hypertension or peripheral artery disease:  no At any time, has a partner hit, kicked or otherwise hurt or frightened you?: no Over the past 2 weeks, have you felt down, depressed or hopeless?: no Over the past 2 weeks, have you felt little interest or pleasure in doing things?:no   Gynecologic History Patient's last menstrual period was 02/13/2023 (exact date). Contraception: tubal ligation Last Pap: 2023. Results were: normal Last mammogram: 2024. Results were: abnormal  Obstetric History OB History  No obstetric history on file.    Past Medical History:  Diagnosis Date   Anxiety    Asthma    COPD (chronic obstructive pulmonary disease) (HCC)    Degenerative disc disease, lumbar    Depression    Diabetes mellitus    Emphysema of lung (HCC)    Hypertension    Iron deficiency anemia 03/12/2014   Leg weakness    Low back pain    Pneumonia    PONV (postoperative nausea and vomiting)    Sleep apnea     Past Surgical History:  Procedure Laterality Date   ALVEOLOPLASTY Bilateral 10/30/2021   Procedure: ALVEOLOPLASTY;  Surgeon: Ocie Doyne, DMD;  Location: [facility] OR;  Service: Oral Surgery;  Laterality: Bilateral;   ceserean section times 2     DILITATION & CURRETTAGE/HYSTROSCOPY WITH HYDROTHERMAL ABLATION N/A 10/25/2014    Procedure: DILATATION & CURETTAGE/HYSTEROSCOPY WITH HYDROTHERMAL ABLATION;  Surgeon: Brock Bad, MD;  Location: [facility];  Service: Gynecology;  Laterality: N/A;   TOOTH EXTRACTION N/A 10/30/2021   Procedure: DENTAL RESTORATION/EXTRACTIONS of 3,5,6,8,9,11,12,14,18,19,20,22,23,25,26,27,28,30,31;  Surgeon: Ocie Doyne, DMD;  Location: [facility] OR;  Service: Oral Surgery;  Laterality: N/A;   TUBAL LIGATION     tumor removed from leg       Current Outpatient Medications:    albuterol (PROVENTIL HFA;VENTOLIN HFA) 108 (90 BASE) MCG/ACT inhaler, Inhale 2 puffs into the lungs every 6 (six) hours as needed for wheezing or shortness of breath. , Disp: , Rfl:    ALPRAZolam (XANAX) 1 MG tablet, Take 1 mg by mouth 3 (three) times daily., Disp: , Rfl:    cyclobenzaprine (FLEXERIL) 10 MG tablet, Take 0.5-1 tablets (5-10 mg total) by mouth 3 (three) times daily as needed for muscle spasms, Disp: 90 tablet, Rfl: 0   cyclobenzaprine (FLEXERIL) 10 MG tablet, Take 1/2-1 tablet (5-10 mg total) by mouth 3 (three) times daily as needed for muscle spasms, Disp: 90 tablet, Rfl: 0   cyclobenzaprine (FLEXERIL) 10 MG tablet, Take 1/2-1 tablet (5-10 mg total) by mouth 3 (three) times daily as needed for muscle spasms., Disp: 90 tablet, Rfl: 0   cyclobenzaprine (FLEXERIL) 10 MG tablet, Take 1/2-1 tablet (5-10 mg total) by mouth 3 (three) times daily  as needed for muscle spasms, Disp: 90 tablet, Rfl: 0   cyclobenzaprine (FLEXERIL) 10 MG tablet, Take 1/2-1 tablet (5-10 mg  total) by mouth 3 (three) times daily as needed for muscle spasms. Space opioids and muscle relaxer 1-2 hours apart to avoid oversedation., Disp: 90 tablet, Rfl: 0   ergocalciferol (VITAMIN D2) 1.25 MG (50000 UT) capsule, Take 1 capsule (50,000 Units total) by mouth once a week., Disp: 12 capsule, Rfl: 1   Fluticasone-Salmeterol (ADVAIR) 250-50 MCG/DOSE AEPB, Inhale 1 puff into the lungs daily. For shortness of breath, Disp: , Rfl:    furosemide (LASIX) 40 MG  tablet, Take 40 mg by mouth 2 (two) times daily., Disp: , Rfl:    ibuprofen (ADVIL,MOTRIN) 800 MG tablet, Take 1 tablet (800 mg total) by mouth 3 (three) times daily. (Patient taking differently: Take 800 mg by mouth 2 (two) times daily.), Disp: 30 tablet, Rfl: 5   insulin aspart protamine-insulin aspart (NOVOLOG 70/30) (70-30) 100 UNIT/ML injection, Inject 15 Units into the skin 2 (two) times daily with a meal. Take 15 units in the morning 10 units at lunch and bedtime  4 time daily If blood sugar is over 400, Disp: , Rfl:    insulin glargine (LANTUS) 100 UNIT/ML injection, Inject 12 Units into the skin at bedtime., Disp: , Rfl:    ipratropium-albuterol (DUONEB) 0.5-2.5 (3) MG/3ML SOLN, Take 3 mLs by nebulization every 4 (four) hours as needed (shortness of breath)., Disp: , Rfl:    LINZESS 145 MCG CAPS capsule, TAKE 1 CAPSULE BY MOUTH DAILY BEFORE BREAKFAST., Disp: 90 capsule, Rfl: 1   losartan (COZAAR) 100 MG tablet, Take 100 mg by mouth daily., Disp: , Rfl:    metFORMIN (GLUCOPHAGE) 1000 MG tablet, Take 1,000 mg by mouth daily with breakfast., Disp: , Rfl:    chlorhexidine (PERIDEX) 0.12 % solution, Use as directed 15 mLs in the mouth or throat 2 (two) times daily. (Patient not taking: Reported on 02/16/2023), Disp: 120 mL, Rfl: 0   clindamycin (CLEOCIN) 300 MG capsule, Take 300 mg by mouth 3 (three) times daily. (Patient not taking: Reported on 07/16/2022), Disp: , Rfl:    naloxone (NARCAN) nasal spray 4 mg/0.1 mL, SMARTSIG:Both Nares (Patient not taking: Reported on 07/29/2022), Disp: , Rfl:    naloxone (NARCAN) nasal spray 4 mg/0.1 mL, Place 1 spray in one nostril for 1 dose. Then if another dose needed, alternate nostrils with each dose until help arrives (Patient not taking: Reported on 02/16/2023), Disp: 2 each, Rfl: 3   oxyCODONE-acetaminophen (PERCOCET) 10-325 MG tablet, Take 1 tablet by mouth every 6 (six) hours as needed for pain. (Patient not taking: Reported on 02/16/2023), Disp: 30 tablet,  Rfl: 0   oxyCODONE-acetaminophen (PERCOCET) 10-325 MG tablet, Take 1 tablet by mouth 4 (four) times daily as needed for pain. (Patient not taking: Reported on 02/16/2023), Disp: 120 tablet, Rfl: 0   oxyCODONE-acetaminophen (PERCOCET) 10-325 MG tablet, Take 1 tablet by mouth 4 (four) times daily as needed for pain (Patient not taking: Reported on 02/16/2023), Disp: 120 tablet, Rfl: 0   polyethylene glycol (MIRALAX) packet, Take 17 g by mouth daily. (Patient not taking: Reported on 02/16/2023), Disp: 14 each, Rfl: 0 Allergies  Allergen Reactions   Penicillins Anaphylaxis   Flovent Hfa [Fluticasone]     migraine   Gabapentin Swelling   Lisinopril     Facial swelling    Social History   Tobacco Use   Smoking status: Every Day    Current packs/day: 0.20    Average packs/day: 0.2 packs/day for 27.0 years (5.4 ttl pk-yrs)    Types: Cigarettes   Smokeless tobacco: Never  Tobacco comments:    2 cigarettes/day  Substance Use Topics   Alcohol use: No    Alcohol/week: 0.0 standard drinks of alcohol    Family History  Problem Relation Age of Onset   Hypertension Mother    Heart disease Mother    Diabetes Father    Colon polyps Neg Hx    Colon cancer Neg Hx    Esophageal cancer Neg Hx    Rectal cancer Neg Hx    Stomach cancer Neg Hx       Review of Systems  Constitutional: negative for fatigue and weight loss Respiratory: negative for cough and wheezing Cardiovascular: negative for chest pain, fatigue and palpitations Gastrointestinal: negative for abdominal pain and change in bowel habits Musculoskeletal:negative for myalgias Neurological: negative for gait problems and tremors Behavioral/Psych: negative for abusive relationship, depression Endocrine: negative for temperature intolerance    Genitourinary:negative for abnormal menstrual periods, genital lesions, hot flashes, sexual problems and vaginal discharge Integument/breast: negative for breast lump, breast tenderness, nipple  discharge and skin lesion(s)    Objective:       BP (!) 175/93   Pulse 77   Wt 235 lb 1.6 oz (106.6 kg)   LMP 02/13/2023 (Exact Date)   BMI 44.42 kg/m  General:   alert  Skin:   no rash or abnormalities  Lungs:   clear to auscultation bilaterally  Heart:   regular rate and rhythm, S1, S2 normal, no murmur, click, rub or gallop  Breasts:   normal without suspicious masses, skin or nipple changes or axillary nodes  Abdomen:  normal findings: no organomegaly, soft, non-tender and no hernia  Pelvis:  External genitalia: normal general appearance Urinary system: urethral meatus normal and bladder without fullness, nontender Vaginal: normal without tenderness, induration or masses Cervix: normal appearance Adnexa: normal bimanual exam Uterus: anteverted and non-tender, normal size   Lab Review Urine pregnancy test Labs reviewed yes Radiologic studies reviewed yes  I have spent a total of 20 minutes of face-to-face and non-face-to-face time, excluding clinical staff time, reviewing notes and preparing to see patient, ordering tests and/or medications, and counseling the patient.   Assessment:    1. Encounter for gynecological examination with Papanicolaou smear of cervix (Primary) Rx: - Cytology - PAP( [place])  2. Vaginal discharge Rx: - Cervicovaginal ancillary only( [place])  3. Screening for STD (sexually transmitted disease) Rx: - HIV antibody (with reflex) - RPR - Hepatitis B Surface AntiGEN - Hepatitis C Antibody  4. Screening breast examination Rx: - MM 3D SCREENING MAMMOGRAM BILATERAL BREAST; Future  5. Obesity due to excess calories without serious comorbidity, unspecified class - weight reduction with the aid of dietary changes, exercise and behavioral modification recommended  6. Tobacco abuse - cessation methods discussed and recommended     Plan:    Education reviewed: calcium supplements, depression evaluation, low fat, low cholesterol  diet, safe sex/STD prevention, self breast exams, smoking cessation, and weight bearing exercise. Mammogram ordered. Follow up in: 1 year.    Orders Placed This Encounter  Procedures   MM 3D SCREENING MAMMOGRAM BILATERAL BREAST    Standing Status:   Future    Expiration Date:   02/16/2024    Reason for Exam (SYMPTOM  OR DIAGNOSIS REQUIRED):   Screening    Is the patient pregnant?:   No    Preferred imaging location?:   [facility]   HIV antibody (with reflex)   RPR   Hepatitis B Surface AntiGEN   Hepatitis C  Antibody    Brock Bad, MD, FACOG Attending Obstetrician & Gynecologist, [facility] for [facility], [facility], [place] 02/16/2023

## 2023-02-18 LAB — CERVICOVAGINAL ANCILLARY ONLY
Bacterial Vaginitis (gardnerella): POSITIVE — AB
Candida Glabrata: NEGATIVE
Candida Vaginitis: NEGATIVE
Chlamydia: NEGATIVE
Comment: NEGATIVE
Comment: NEGATIVE
Comment: NEGATIVE
Comment: NEGATIVE
Comment: NEGATIVE
Comment: NORMAL
Neisseria Gonorrhea: NEGATIVE
Trichomonas: POSITIVE — AB

## 2023-02-21 ENCOUNTER — Other Ambulatory Visit: Payer: Self-pay | Admitting: Family Medicine

## 2023-02-21 LAB — CYTOLOGY - PAP
Comment: NEGATIVE
Diagnosis: UNDETERMINED — AB
High risk HPV: NEGATIVE

## 2023-02-21 MED ORDER — METRONIDAZOLE 500 MG PO TABS: 500.0000 mg | ORAL_TABLET | Freq: Two times a day (BID) | ORAL | 0 refills | Status: AC

## 2023-08-03 ENCOUNTER — Ambulatory Visit: Admitting: Radiology

## 2023-08-08 ENCOUNTER — Emergency Department (HOSPITAL_COMMUNITY)

## 2023-08-08 ENCOUNTER — Encounter (HOSPITAL_COMMUNITY): Payer: Self-pay

## 2023-08-08 ENCOUNTER — Other Ambulatory Visit: Payer: Self-pay

## 2023-08-08 ENCOUNTER — Emergency Department (HOSPITAL_COMMUNITY): Admission: EM | Admit: 2023-08-08 | Discharge: 2023-08-08 | Disposition: A | Discharge status: Home / Self Care

## 2023-08-08 DIAGNOSIS — S0285XA Fracture of orbit, unspecified, initial encounter for closed fracture: Secondary | ICD-10-CM

## 2023-08-08 DIAGNOSIS — M25561 Pain in right knee: Secondary | ICD-10-CM | POA: Diagnosis not present

## 2023-08-08 DIAGNOSIS — S0993XA Unspecified injury of face, initial encounter: Secondary | ICD-10-CM | POA: Diagnosis present

## 2023-08-08 DIAGNOSIS — S02831A Fracture of medial orbital wall, right side, initial encounter for closed fracture: Secondary | ICD-10-CM | POA: Diagnosis not present

## 2023-08-08 DIAGNOSIS — W1830XA Fall on same level, unspecified, initial encounter: Secondary | ICD-10-CM | POA: Insufficient documentation

## 2023-08-08 MED ORDER — FLUORESCEIN SODIUM 1 MG OP STRP
1.0000 | ORAL_STRIP | Freq: Once | OPHTHALMIC | Status: AC
  Administered 2023-08-08: 1 via OPHTHALMIC
  Filled 2023-08-08: qty 1

## 2023-08-08 MED ORDER — DOXYCYCLINE HYCLATE 100 MG PO CAPS: 100.0000 mg | ORAL_CAPSULE | Freq: Two times a day (BID) | ORAL | 0 refills | Status: DC

## 2023-08-08 MED ORDER — TETRACAINE HCL 0.5 % OP SOLN
2.0000 [drp] | Freq: Once | OPHTHALMIC | Status: AC
  Administered 2023-08-08: 2 [drp] via OPHTHALMIC
  Filled 2023-08-08: qty 4

## 2023-08-08 NOTE — ED Notes (Signed)
 Pt gives verbal consent for mse

## 2023-08-08 NOTE — ED Triage Notes (Signed)
 Pt to er, pt states that about an hour ago she was walking and tripped and fell on the road/lawn, pt states that she has some leg pain and states that she can't open her R eye at this time because it is swollen.  Pt states that she is not on a blood thinner.  Denies LOC, pt awake and oriented times three.

## 2023-08-08 NOTE — ED Provider Notes (Signed)
 [place] EMERGENCY DEPARTMENT AT [facility] Provider Note   CSN: [id number] Arrival date & time: 08/08/23  1245     Patient presents with: Andrea Montgomery is a 48 y.o. female.   Is a 48 year old female presenting emergency department after a ground-level fall.  Mechanical, fell onto her knee and then struck her face.  Complaining of pain around her right eye.  No painful EOM.  Can see if she holds her swollen eyelids open.  Not having headache, chest pain, shortness of breath.  No numbness tingling changes in sensation.  Complaining of pain to her right kneecap.   Fall       Prior to Admission medications   Medication Sig Start Date End Date Taking? Authorizing Provider  doxycycline  (VIBRAMYCIN ) 100 MG capsule Take 1 capsule (100 mg total) by mouth 2 (two) times daily. 08/08/23  Yes Neysa Caron PARAS, DO  albuterol  (PROVENTIL  HFA;VENTOLIN  HFA) 108 (90 BASE) MCG/ACT inhaler Inhale 2 puffs into the lungs every 6 (six) hours as needed for wheezing or shortness of breath.     [provider]  ALPRAZolam SHEFFIELD) 1 MG tablet Take 1 mg by mouth 3 (three) times daily.    [provider]  chlorhexidine  (PERIDEX ) 0.12 % solution Use as directed 15 mLs in the mouth or throat 2 (two) times daily. Patient not taking: Reported on 02/16/2023 10/30/21   Sheryle Hamilton, DMD  clindamycin  (CLEOCIN ) 300 MG capsule Take 300 mg by mouth 3 (three) times daily. Patient not taking: Reported on 07/16/2022    [provider]  cyclobenzaprine  (FLEXERIL ) 10 MG tablet Take 0.5-1 tablets (5-10 mg total) by mouth 3 (three) times daily as needed for muscle spasms 11/11/21     cyclobenzaprine  (FLEXERIL ) 10 MG tablet Take 1/2-1 tablet (5-10 mg total) by mouth 3 (three) times daily as needed for muscle spasms 12/10/21     cyclobenzaprine  (FLEXERIL ) 10 MG tablet Take 1/2-1 tablet (5-10 mg total) by mouth 3 (three) times daily as needed for muscle spasms. 04/05/22     cyclobenzaprine   (FLEXERIL ) 10 MG tablet Take 1/2-1 tablet (5-10 mg total) by mouth 3 (three) times daily  as needed for muscle spasms 06/04/22     cyclobenzaprine  (FLEXERIL ) 10 MG tablet Take 1/2-1 tablet (5-10 mg total) by mouth 3 (three) times daily as needed for muscle spasms. Space opioids and muscle relaxer 1-2 hours apart to avoid oversedation. 07/30/22     ergocalciferol  (VITAMIN D2) 1.25 MG (50000 UT) capsule Take 1 capsule (50,000 Units total) by mouth once a week. 05/06/22     Fluticasone-Salmeterol (ADVAIR) 250-50 MCG/DOSE AEPB Inhale 1 puff into the lungs daily. For shortness of breath    [provider]  furosemide  (LASIX ) 40 MG tablet Take 40 mg by mouth 2 (two) times daily.    [provider]  ibuprofen  (ADVIL ,MOTRIN ) 800 MG tablet Take 1 tablet (800 mg total) by mouth 3 (three) times daily. Patient taking differently: Take 800 mg by mouth 2 (two) times daily. 04/29/17   Rudy Carlin LABOR, MD  insulin  aspart protamine-insulin  aspart (NOVOLOG  70/30) (70-30) 100 UNIT/ML injection Inject 15 Units into the skin 2 (two) times daily with a meal. Take 15 units in the morning 10 units at lunch and bedtime  4 time daily If blood sugar is over 400    [provider]  insulin  glargine (LANTUS ) 100 UNIT/ML injection Inject 12 Units into the skin at bedtime.    [provider]  ipratropium-albuterol  (DUONEB) 0.5-2.5 (3) MG/3ML SOLN Take 3 mLs by nebulization every 4 (four) hours as needed (shortness of breath). 03/05/21   [provider]  LINZESS  145 MCG CAPS capsule TAKE 1 CAPSULE BY MOUTH DAILY BEFORE BREAKFAST. 11/15/22   Federico Rosario BROCKS, MD  losartan  (COZAAR ) 100 MG tablet Take 100 mg by mouth daily. 04/27/22   [provider]  metFORMIN  (GLUCOPHAGE ) 1000 MG tablet Take 1,000 mg by mouth daily with breakfast.    [provider]  naloxone  (NARCAN ) nasal spray 4 mg/0.1 mL SMARTSIG:Both Nares Patient not taking: Reported on 07/29/2022 05/14/21   [provider]  naloxone  (NARCAN ) nasal spray 4 mg/0.1 mL Place 1 spray in one nostril for 1 dose. Then if another dose needed, alternate nostrils with each dose until help arrives Patient not taking: Reported on 02/16/2023 08/30/22     oxyCODONE -acetaminophen  (PERCOCET) 10-325 MG tablet Take 1 tablet by mouth every 6 (six) hours as needed for pain. Patient not taking: Reported on 02/16/2023 10/30/21   Sheryle Hamilton, DMD  oxyCODONE -acetaminophen  (PERCOCET) 10-325 MG tablet Take 1 tablet by mouth 4 (four) times daily as needed for pain. Patient not taking: Reported on 02/16/2023 07/30/22     oxyCODONE -acetaminophen  (PERCOCET) 10-325 MG tablet Take 1 tablet by mouth 4 (four) times daily as needed for pain Patient not taking: Reported on 02/16/2023 08/30/22     polyethylene glycol (MIRALAX ) packet Take 17 g by mouth daily. Patient not taking: Reported on 02/16/2023 09/23/17   Sofia, Leslie K, PA-C    Allergies: Penicillins, Flovent hfa [fluticasone], Gabapentin, and Lisinopril    Review of Systems  Updated Vital Signs BP (!) 152/97 (BP Location: Left Arm)   Pulse 84   Temp 98.6 F (37 C)   Resp 17   SpO2 94%   Physical Exam Vitals and nursing note reviewed.  Constitutional:      General: She is not in acute distress.    Appearance: She is obese. She is not toxic-appearing.  HENT:     Head: Normocephalic.     Nose: Nose normal.     Mouth/Throat:     Mouth: Mucous membranes are moist.  Eyes:     Comments: Swelling to the right eyelid, with a vague eye can be opened full pain-free EOM.  Visual acuity at baseline.  No proptosis.  Fluorescein  with no uptake.  Pupils equal round reactive to light.  Seidel sign negative.  Grossly normal pressure  Cardiovascular:     Rate and Rhythm: Normal rate and regular rhythm.  Pulmonary:     Effort: Pulmonary effort is normal.  Abdominal:     General: Abdomen is flat. There is no distension.     Tenderness: There is no abdominal tenderness. There is no  guarding or rebound.  Musculoskeletal:     Comments: Tenderness to the right knee.  Chest wall stable nontender.  Pelvis stable nontender.  Neurovascularly intact in all extremities.  Skin:    Capillary Refill: Capillary refill takes less than 2 seconds.  Neurological:     Mental Status: She is alert and oriented to person, place, and time.  Psychiatric:        Mood and Affect: Mood normal.        Behavior: Behavior normal.     (all labs ordered are listed, but only abnormal results are displayed) Labs Reviewed - No data to display  EKG: None  Radiology: CT Maxillofacial Wo Contrast Result Date: 08/08/2023 CLINICAL DATA:  Blunt facial trauma.  EXAM: CT MAXILLOFACIAL WITHOUT CONTRAST TECHNIQUE: Multidetector CT imaging of the maxillofacial structures was performed. Multiplanar CT image reconstructions were also generated. RADIATION DOSE REDUCTION: This exam was performed according to the departmental dose-optimization program which includes automated exposure control, adjustment of the mA and/or kV according to patient size and/or use of iterative reconstruction technique. COMPARISON:  Head CT 04/20/2022 FINDINGS: Osseous: There is a depressed right medial orbital wall fracture. No entrapment of intraorbital contents. No additional facial bone fractures visualized. Orbits: Globes are normal and symmetric. Optic nerves and extraocular muscles are normal. Evidence of patient's right medial wall orbital fracture with small amount of air over the inferior and medial orbit. Moderate air over the anterior inferior orbital soft tissues. Sinuses: Mild opacification of the right ethmoid air cells likely hemorrhagic debris from patient's adjacent fracture. Remaining sinuses are clear. Soft tissues: Soft tissue swelling and air over the right inferior overall soft tissues. Limited intracranial: No significant or unexpected finding. IMPRESSION: Depressed right medial orbital wall fracture. No entrapment of  intraorbital contents. Associated soft tissue swelling and air over the right inferior orbital soft tissues. Electronically Signed   By: Toribio Agreste M.D.   On: 08/08/2023 14:21   DG Knee Complete 4 Views Right Result Date: 08/08/2023 CLINICAL DATA:  knee pain. EXAM: RIGHT KNEE - COMPLETE 4+ VIEW COMPARISON:  None Available. FINDINGS: No acute fracture or dislocation. No aggressive osseous lesion. The knee joint appears within normal limits. No significant arthritis. No knee effusion or focal soft tissue swelling. No radiopaque foreign bodies. IMPRESSION: No acute osseous abnormality of the right knee joint. Electronically Signed   By: Ree Molt M.D.   On: 08/08/2023 13:41     Procedures   Medications Ordered in the ED  fluorescein  ophthalmic strip 1 strip (1 strip Right Eye Given 08/08/23 1611)  tetracaine  (PONTOCAINE) 0.5 % ophthalmic solution 2 drop (2 drops Right Eye Given 08/08/23 1611)                                    Medical Decision Making 48 year old female to the emergency department for evaluation of right periorbital eye swelling after a ground-level fall today.  Vital signs reassuring.  She is not on a blood thinner.  No indication, C-spine based on [place] CT head rules.  However given significant swelling to right eye concern for orbital fracture.  CT scan obtained and did show old medial orbital fracture.  No sign of entrapment.  Eye exam reassuring.  Tender to the right knee, but x-ray is negative.  Offered pain medications, but declined states that she is on pain management.  Will give antibiotics and give ENT and ophthalmology follow-up.  Amount and/or Complexity of Data Reviewed Independent Historian:     Details: Family member notes mechanical fall, no LOC External Data Reviewed:     Details: Not on blood thinner Labs:     Details: Considered labs, however low suspicion for acute intrathoracic or intra-abdominal pathology, mechanical fall. Radiology: ordered and  independent interpretation performed.    Details: X-ray without obvious fracture  Risk Prescription drug management. Decision regarding hospitalization. Diagnosis or treatment significantly limited by social determinants of health. Risk Details: Poor health literacy      Final diagnoses:  Closed fracture of orbital wall, initial encounter [facility])    ED Discharge Orders          Ordered    Ambulatory referral to  ENT       Comments: Orbital wall fracture   08/08/23 1559    doxycycline  (VIBRAMYCIN ) 100 MG capsule  2 times daily        08/08/23 1603               Neysa Caron PARAS, [place] 08/08/23 2338

## 2023-08-08 NOTE — ED Triage Notes (Signed)
 Pt c/o pain to R knee and R face; ambulatory in triage with cane

## 2023-08-08 NOTE — Discharge Instructions (Addendum)
 Please follow-up with the ear nose throat doctor and the ophthalmologist.  Please call to schedule an appointment.  We are prescribing you antibiotics to take to help with that infection.  Furthermore you can: ? Apply intermittent cold therapy over the injury site for the first 48 hours to reduce swelling ? Sleep with the head of the bed elevated ? Avoid nose blowing and sniffing  Return if develop fevers, chills, severe headache, passout, chest pain, shortness of breath, severe abdominal pain, inability eat or drink due to nausea vomiting or he develop any new or worsening symptoms that are concerning to you.

## 2023-08-10 ENCOUNTER — Encounter (INDEPENDENT_AMBULATORY_CARE_PROVIDER_SITE_OTHER): Payer: Self-pay | Admitting: Physician Assistant

## 2023-08-10 ENCOUNTER — Ambulatory Visit (INDEPENDENT_AMBULATORY_CARE_PROVIDER_SITE_OTHER): Admitting: Physician Assistant

## 2023-08-10 ENCOUNTER — Ambulatory Visit: Admitting: Nurse Practitioner

## 2023-08-10 VITALS — BP 164/98

## 2023-08-10 DIAGNOSIS — S0285XA Fracture of orbit, unspecified, initial encounter for closed fracture: Secondary | ICD-10-CM

## 2023-08-10 DIAGNOSIS — S02831A Fracture of medial orbital wall, right side, initial encounter for closed fracture: Secondary | ICD-10-CM | POA: Diagnosis not present

## 2023-08-10 DIAGNOSIS — W19XXXA Unspecified fall, initial encounter: Secondary | ICD-10-CM

## 2023-08-10 NOTE — Progress Notes (Signed)
 Dear Dr. Hillman, Here is my assessment for our mutual patient, Andrea Montgomery. Thank you for allowing me the opportunity to care for your patient. Please do not hesitate to contact me should you have any other questions. Sincerely, Chyrl Cohen PA-C  Otolaryngology Clinic Note Referring provider: Dr. Hillman HPI:  Andrea Montgomery is a 48 y.o. female kindly referred by Dr. Hillman   The patient is a 48 year old female seen in our office today for follow-up evaluation of facial trauma.  Patient is on 08/08/2023 she had a mechanical fall striking her face.  She was seen in the ER where she had a CT maxillofacial that showed depressed right medial orbital wall fracture with no entrapment of the intraorbital contents.  She notes since that time she has had ongoing pain out of the right eye.  She feels the swelling has kept her from being able to open the eye.  She notes at baseline she wears glasses.  No other injuries to the face noted.  When asked about double vision she notes she is having double vision and decreased visual acuity but notes she has not been wearing her glasses.   Independent Review of Additional Tests or Records:  CT maxillofacial on 08/08/2023   PMH/Meds/All/SocHx/FamHx/ROS:   Past Medical History:  Diagnosis Date   Anxiety    Asthma    COPD (chronic obstructive pulmonary disease) (HCC)    Degenerative disc disease, lumbar    Depression    Diabetes mellitus    Emphysema of lung (HCC)    Hypertension    Iron deficiency anemia 03/12/2014   Leg weakness    Low back pain    Pneumonia    PONV (postoperative nausea and vomiting)    Sleep apnea      Past Surgical History:  Procedure Laterality Date   ALVEOLOPLASTY Bilateral 10/30/2021   Procedure: ALVEOLOPLASTY;  Surgeon: Sheryle Hamilton, DMD;  Location: [facility];  Service: Oral Surgery;  Laterality: Bilateral;   ceserean section times 2     DILITATION & CURRETTAGE/HYSTROSCOPY WITH HYDROTHERMAL ABLATION N/A 10/25/2014    Procedure: DILATATION & CURETTAGE/HYSTEROSCOPY WITH HYDROTHERMAL ABLATION;  Surgeon: Carlin DELENA Centers, MD;  Location: [facility];  Service: Gynecology;  Laterality: N/A;   TOOTH EXTRACTION N/A 10/30/2021   Procedure: DENTAL RESTORATION/EXTRACTIONS of 3,5,6,8,9,11,12,14,18,19,20,22,23,25,26,27,28,30,31;  Surgeon: Sheryle Hamilton, DMD;  Location: [facility];  Service: Oral Surgery;  Laterality: N/A;   TUBAL LIGATION     tumor removed from leg      Family History  Problem Relation Age of Onset   Hypertension Mother    Heart disease Mother    Diabetes Father    Colon polyps Neg Hx    Colon cancer Neg Hx    Esophageal cancer Neg Hx    Rectal cancer Neg Hx    Stomach cancer Neg Hx      Social Connections: Not on file      Current Outpatient Medications:    albuterol  (PROVENTIL  HFA;VENTOLIN  HFA) 108 (90 BASE) MCG/ACT inhaler, Inhale 2 puffs into the lungs every 6 (six) hours as needed for wheezing or shortness of breath. , Disp: , Rfl:    ALPRAZolam (XANAX) 1 MG tablet, Take 1 mg by mouth 3 (three) times daily., Disp: , Rfl:    chlorhexidine  (PERIDEX ) 0.12 % solution, Use as directed 15 mLs in the mouth or throat 2 (two) times daily. (Patient not taking: Reported on 02/16/2023), Disp: 120 mL, Rfl: 0   clindamycin  (CLEOCIN ) 300 MG capsule, Take 300 mg by  mouth 3 (three) times daily. (Patient not taking: Reported on 07/16/2022), Disp: , Rfl:    cyclobenzaprine  (FLEXERIL ) 10 MG tablet, Take 0.5-1 tablets (5-10 mg total) by mouth 3 (three) times daily as needed for muscle spasms, Disp: 90 tablet, Rfl: 0   cyclobenzaprine  (FLEXERIL ) 10 MG tablet, Take 1/2-1 tablet (5-10 mg total) by mouth 3 (three) times daily as needed for muscle spasms, Disp: 90 tablet, Rfl: 0   cyclobenzaprine  (FLEXERIL ) 10 MG tablet, Take 1/2-1 tablet (5-10 mg total) by mouth 3 (three) times daily as needed for muscle spasms., Disp: 90 tablet, Rfl: 0   cyclobenzaprine  (FLEXERIL ) 10 MG tablet, Take 1/2-1 tablet (5-10 mg total) by mouth 3  (three) times daily  as needed for muscle spasms, Disp: 90 tablet, Rfl: 0   cyclobenzaprine  (FLEXERIL ) 10 MG tablet, Take 1/2-1 tablet (5-10 mg total) by mouth 3 (three) times daily as needed for muscle spasms. Space opioids and muscle relaxer 1-2 hours apart to avoid oversedation., Disp: 90 tablet, Rfl: 0   doxycycline  (VIBRAMYCIN ) 100 MG capsule, Take 1 capsule (100 mg total) by mouth 2 (two) times daily., Disp: 20 capsule, Rfl: 0   ergocalciferol  (VITAMIN D2) 1.25 MG (50000 UT) capsule, Take 1 capsule (50,000 Units total) by mouth once a week., Disp: 12 capsule, Rfl: 1   Fluticasone-Salmeterol (ADVAIR) 250-50 MCG/DOSE AEPB, Inhale 1 puff into the lungs daily. For shortness of breath, Disp: , Rfl:    furosemide  (LASIX ) 40 MG tablet, Take 40 mg by mouth 2 (two) times daily., Disp: , Rfl:    ibuprofen  (ADVIL ,MOTRIN ) 800 MG tablet, Take 1 tablet (800 mg total) by mouth 3 (three) times daily. (Patient taking differently: Take 800 mg by mouth 2 (two) times daily.), Disp: 30 tablet, Rfl: 5   insulin  aspart protamine-insulin  aspart (NOVOLOG  70/30) (70-30) 100 UNIT/ML injection, Inject 15 Units into the skin 2 (two) times daily with a meal. Take 15 units in the morning 10 units at lunch and bedtime  4 time daily If blood sugar is over 400, Disp: , Rfl:    insulin  glargine (LANTUS ) 100 UNIT/ML injection, Inject 12 Units into the skin at bedtime., Disp: , Rfl:    ipratropium-albuterol  (DUONEB) 0.5-2.5 (3) MG/3ML SOLN, Take 3 mLs by nebulization every 4 (four) hours as needed (shortness of breath)., Disp: , Rfl:    LINZESS  145 MCG CAPS capsule, TAKE 1 CAPSULE BY MOUTH DAILY BEFORE BREAKFAST., Disp: 90 capsule, Rfl: 1   losartan  (COZAAR ) 100 MG tablet, Take 100 mg by mouth daily., Disp: , Rfl:    metFORMIN  (GLUCOPHAGE ) 1000 MG tablet, Take 1,000 mg by mouth daily with breakfast., Disp: , Rfl:    naloxone  (NARCAN ) nasal spray 4 mg/0.1 mL, SMARTSIG:Both Nares (Patient not taking: Reported on 07/29/2022), Disp: ,  Rfl:    naloxone  (NARCAN ) nasal spray 4 mg/0.1 mL, Place 1 spray in one nostril for 1 dose. Then if another dose needed, alternate nostrils with each dose until help arrives (Patient not taking: Reported on 02/16/2023), Disp: 2 each, Rfl: 3   oxyCODONE -acetaminophen  (PERCOCET) 10-325 MG tablet, Take 1 tablet by mouth every 6 (six) hours as needed for pain. (Patient not taking: Reported on 02/16/2023), Disp: 30 tablet, Rfl: 0   oxyCODONE -acetaminophen  (PERCOCET) 10-325 MG tablet, Take 1 tablet by mouth 4 (four) times daily as needed for pain. (Patient not taking: Reported on 02/16/2023), Disp: 120 tablet, Rfl: 0   oxyCODONE -acetaminophen  (PERCOCET) 10-325 MG tablet, Take 1 tablet by mouth 4 (four) times daily as needed for pain (  Patient not taking: Reported on 02/16/2023), Disp: 120 tablet, Rfl: 0   polyethylene glycol (MIRALAX ) packet, Take 17 g by mouth daily. (Patient not taking: Reported on 02/16/2023), Disp: 14 each, Rfl: 0   Physical Exam:   BP (!) 164/98   Pertinent Findings  Difficult exam due to patient compliance CN II-12 intact Facial sensation intact  Pupils equal round and reactive to light, no afferent pupillary defect; vision acuity normal for patient No chemosis or conjunctival hemorrhage  No widened intercanthal  distance  No enophthalmus, proptosis or dystopia  No periocular ecchymosis  Extraocular movement are intact  No orbital emphysema, eyelids are soft with no decreased retropulsion Palpation of face reveals tenderness to the right periocular soft tissue with minimal swelling, no crepitus   Seprately Identifiable Procedures:  None  Impression & Plans:  Renea Schoonmaker is a 48 y.o. female with the following   Orbital wall fracture-  48 year old female seen in our office for right medial orbital wall fracture.  CT reviewed no entrapment of intraorbital contents, no hematomas noted.  The patient was able to ambulate into the office without difficulty, I visualized her with  her left eye open.  When I tried to examine the right eye she was unable to open it and said she could not, with distraction she indeed was able to open the eye but would not keep it open for my exam.  She also would not look up with either eyes when I tried to do my visual field testing or ocular movement testing.  With the right eye closed with encouragement I was able to visualize the left eye, she reported double vision out of the left eye which was not the injured eye.  Overall the exam is very limited, I was able to eventually get ocular movements on the right, no signs of entrapment.  Given my exam I would recommend she follow-up in 1 to 2 weeks for repeat evaluation to ensure no significant issues are noted although I have very low suspicion for this.  She understands to follow-up sooner as needed.  The patient also was advised not to blow her nose for the next several weeks.  The patient and her partner who was there today verbalized understanding and agreement to today's plan had no further questions or concerns.   - f/u 1-2 weeks.    Thank you for allowing me the opportunity to care for your patient. Please do not hesitate to contact me should you have any other questions.  Sincerely, Chyrl Cohen PA-C [facility] Phone: [phone] Fax: [phone]  08/10/2023, 4:05 PM

## 2023-08-17 NOTE — Progress Notes (Signed)
 Office Visit Note  Patient: Andrea Montgomery             Date of Birth: October 20, 1975           MRN: [id number]             PCP: Hillman Bare, MD Referring: Leron Millman, NP Visit Date: 08/18/2023   Subjective:  New Patient (Initial Visit) (Abnormal labs, was told she had lupus but not sure. )   Discussed the use of AI scribe software for clinical note transcription with the patient, who gave verbal consent to proceed.  History of Present Illness   Andrea Montgomery is a 48 year old female who presents with joint inflammation and abnormal lab tests with highly positive RNP and elevated sedimentation rate.  She experiences joint inflammation, particularly in her knees and fingers. Her knees swell, and her left knee frequently gives out, leading to a recent fall and striking her right knee that remains somewhat swollen now. 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 She tends to notice her ankle and foot swelling most prominently when blood sugar is high.  She describes a skin condition on her side that has been present for about a year, characterized by a 'weird' appearance and a bruise-like discoloration. This condition has gradually appeared and remains consistent in appearance. She also reports small, painful nodules on her legs that occasionally become discolored. She experiences circulation issues, with her fingertips turning blue or white in cold conditions but these return to a normal appearance between episodes.  She has a history of scoliosis and a slipped disc in her neck. She experiences related symptoms.  She has previously seen Dr. Addie in orthopedics clinic for chronic knee pain but without any severe loss of joint space, including on most recent x-ray checked after recent fall of the right knee.  She does not notice frequent cervical or axillary lymph nodes.  Does get occasional mouth sores but infrequently.  No known history of blood clots.   Labs  reviewed RNP >8.0 dsDNA, SSA, SSB, Sm neg ESR 42  Activities of Daily Living:  Patient reports morning stiffness for 20 minutes.   Patient Reports nocturnal pain.  Difficulty dressing/grooming: Reports Difficulty climbing stairs: Denies Difficulty getting out of chair: Reports Difficulty using hands for taps, buttons, cutlery, and/or writing: Denies  Review of Systems  Constitutional:  Positive for fatigue.  HENT:  Positive for mouth sores and mouth dryness.   Eyes:  Positive for dryness.  Respiratory:  Positive for shortness of breath.   Cardiovascular:  Positive for chest pain and palpitations.  Gastrointestinal:  Positive for blood in stool and constipation. Negative for diarrhea.  Endocrine: Positive for increased urination.  Genitourinary:  Negative for involuntary urination.  Musculoskeletal:  Positive for joint pain, gait problem, joint pain, joint swelling, myalgias, muscle weakness, morning stiffness and myalgias. Negative for muscle tenderness.  Skin:  Positive for color change and hair loss. Negative for rash and sensitivity to sunlight.  Allergic/Immunologic: Positive for susceptible to infections.  Neurological:  Positive for headaches. Negative for dizziness.  Hematological:  Negative for swollen glands.  Psychiatric/Behavioral:  Positive for depressed mood and sleep disturbance. The patient is nervous/anxious.     PMFS History:  Patient Active Problem List   Diagnosis Date Noted   Positive ANA (antinuclear antibody) 08/18/2023   Rash and other nonspecific skin eruption 08/18/2023   Bacteremia    Constipation    Foot pain,  left    UTI (urinary tract infection) due to Enterococcus 03/06/2021   Chronic right-sided low back pain with bilateral sciatica 05/01/2020   Trichomoniasis 07/05/2017   Tobacco abuse 07/19/2014   Knee pain, chronic 03/13/2014   Abnormal MRI, knee 03/13/2014   Menorrhagia with irregular cycle 03/13/2014   Iron deficiency anemia 03/12/2014     Past Medical History:  Diagnosis Date   Anxiety    Asthma    COPD (chronic obstructive pulmonary disease) (HCC)    Degenerative disc disease, lumbar    Depression    Diabetes mellitus    Emphysema of lung (HCC)    Hypertension    Iron deficiency anemia 03/12/2014   Leg weakness    Low back pain    Pneumonia    PONV (postoperative nausea and vomiting)    Sleep apnea     Family History  Problem Relation Age of Onset   Hypertension Mother    Heart disease Mother    Dementia Mother    Clotting disorder Mother    Hypertension Father    Diabetes Father    Lung cancer Father    COPD Father    Emphysema Father    Hypertension Brother    Diabetes Brother    Hypertension Brother    Diabetes Brother    Autism spectrum disorder Son    Hypertension Daughter    Asthma Daughter    GER disease Daughter    Colon polyps Neg Hx    Colon cancer Neg Hx    Esophageal cancer Neg Hx    Rectal cancer Neg Hx    Stomach cancer Neg Hx    Past Surgical History:  Procedure Laterality Date   ALVEOLOPLASTY Bilateral 10/30/2021   Procedure: ALVEOLOPLASTY;  Surgeon: Sheryle Hamilton, DMD;  Location: [facility];  Service: Oral Surgery;  Laterality: Bilateral;   ceserean section times 2     DILITATION & CURRETTAGE/HYSTROSCOPY WITH HYDROTHERMAL ABLATION N/A 10/25/2014   Procedure: DILATATION & CURETTAGE/HYSTEROSCOPY WITH HYDROTHERMAL ABLATION;  Surgeon: Carlin DELENA Centers, MD;  Location: [facility];  Service: Gynecology;  Laterality: N/A;   TOOTH EXTRACTION N/A 10/30/2021   Procedure: DENTAL RESTORATION/EXTRACTIONS of 3,5,6,8,9,11,12,14,18,19,20,22,23,25,26,27,28,30,31;  Surgeon: Sheryle Hamilton, DMD;  Location: [facility] OR;  Service: Oral Surgery;  Laterality: N/A;   TUBAL LIGATION     tumor removed from leg     Social History   Social History Narrative   Right-handed.   Lives at home with her two children.    No daily caffeine use.    There is no immunization history on file for this patient.    Objective: Vital Signs: BP (!) 143/79 (BP Location: Left Arm, Patient Position: Sitting, Cuff Size: Normal)   Pulse 79   Resp 18   Ht 5' 4 (1.626 m)   Wt 237 lb 14.4 oz (107.9 kg)   LMP 07/19/2023 (Approximate)   BMI 40.84 kg/m    Physical Exam Constitutional:      Appearance: She is obese.  HENT:     Mouth/Throat:     Mouth: Mucous membranes are moist.     Pharynx: Oropharynx is clear.  Eyes:     Conjunctiva/sclera: Conjunctivae normal.  Cardiovascular:     Rate and Rhythm: Normal rate and regular rhythm.  Pulmonary:     Effort: Pulmonary effort is normal.     Breath sounds: Normal breath sounds.  Musculoskeletal:     Right lower leg: No edema.     Left lower leg: No edema.  Lymphadenopathy:  Cervical: No cervical adenopathy.  Skin:    General: Skin is warm and dry.     Findings: Rash (attached) present.     Comments: Scattered telangiectasias on fingers and palms, normal capillary refill, no pitting  Neurological:     Mental Status: She is alert.  Psychiatric:        Mood and Affect: Mood normal.     Musculoskeletal Exam:  Shoulders full ROM no tenderness or swelling Elbows full ROM no tenderness or swelling Wrists full ROM no tenderness or swelling Fingers full ROM no tenderness or swelling Middle and low back muscle tenderness to pressure without radiation Knees full ROM, very painful with full flexion and extension, right knee joint line tenderness to pressure, mild swelling present    Investigation: No additional findings.  Imaging: CT Maxillofacial Wo Contrast Result Date: 08/08/2023 CLINICAL DATA:  Blunt facial trauma. EXAM: CT MAXILLOFACIAL WITHOUT CONTRAST TECHNIQUE: Multidetector CT imaging of the maxillofacial structures was performed. Multiplanar CT image reconstructions were also generated. RADIATION DOSE REDUCTION: This exam was performed according to the departmental dose-optimization program which includes automated exposure control,  adjustment of the mA and/or kV according to patient size and/or use of iterative reconstruction technique. COMPARISON:  Head CT 04/20/2022 FINDINGS: Osseous: There is a depressed right medial orbital wall fracture. No entrapment of intraorbital contents. No additional facial bone fractures visualized. Orbits: Globes are normal and symmetric. Optic nerves and extraocular muscles are normal. Evidence of patient's right medial wall orbital fracture with small amount of air over the inferior and medial orbit. Moderate air over the anterior inferior orbital soft tissues. Sinuses: Mild opacification of the right ethmoid air cells likely hemorrhagic debris from patient's adjacent fracture. Remaining sinuses are clear. Soft tissues: Soft tissue swelling and air over the right inferior overall soft tissues. Limited intracranial: No significant or unexpected finding. IMPRESSION: Depressed right medial orbital wall fracture. No entrapment of intraorbital contents. Associated soft tissue swelling and air over the right inferior orbital soft tissues. Electronically Signed   By: Toribio Agreste M.D.   On: 08/08/2023 14:21   DG Knee Complete 4 Views Right Result Date: 08/08/2023 CLINICAL DATA:  knee pain. EXAM: RIGHT KNEE - COMPLETE 4+ VIEW COMPARISON:  None Available. FINDINGS: No acute fracture or dislocation. No aggressive osseous lesion. The knee joint appears within normal limits. No significant arthritis. No knee effusion or focal soft tissue swelling. No radiopaque foreign bodies. IMPRESSION: No acute osseous abnormality of the right knee joint. Electronically Signed   By: Ree Molt M.D.   On: 08/08/2023 13:41    Recent Labs: Lab Results  Component Value Date   WBC 9.4 04/15/2022   HGB 12.5 04/15/2022   PLT 263 04/15/2022   NA 139 04/15/2022   K 4.0 04/15/2022   CL 102 04/15/2022   CO2 28 04/15/2022   GLUCOSE 132 (H) 04/15/2022   BUN 9 04/15/2022   CREATININE 0.76 04/15/2022   BILITOT 0.5 04/15/2022    ALKPHOS 45 04/15/2022   AST 19 04/15/2022   ALT 18 04/15/2022   PROT 7.3 04/15/2022   ALBUMIN 3.8 04/15/2022   CALCIUM 9.2 04/15/2022   GFRAA >60 01/27/2018    Speciality Comments: No specialty comments available.  Procedures:  No procedures performed Allergies: Penicillins, Flovent hfa [fluticasone], Gabapentin, and Lisinopril   Assessment / Plan:     Visit Diagnoses: Positive ANA (antinuclear antibody) - Plan: Anti-DNA antibody, double-stranded, RNP Antibody, Beta-2  glycoprotein antibodies, Cardiolipin antibodies, IgG, IgM, IgA, C3 and C4, Sedimentation rate,  C-reactive protein, CBC with Differential/Platelet, Protein / creatinine ratio, urine Concern for autoimmune disorder causing joint inflammation, particularly in knees and fingers. Differential includes lupus due to presence of antibodies.  I do not see highly specific clinical criteria from a history and exam today and despite significant knee pain I am not sure there is a lot of peripheral joint synovitis.  Has nonspecific finger changes with some telangiectasias and describes discoloration not entirely typical for Raynaud's.  Would have a low threshold for consideration of trial hydroxychloroquine if findings are inconclusive. - Order blood tests for lupus and autoimmune markers as above. - Review lab results in 2-4 weeks for medication or further intervention.  Rash and other nonspecific skin eruption Livedoid/reticular skin changes Lacy, reticular hyperpigmented skin pattern due to small blood vessel changes.  Do not currently see any overlying lesions and does not have any history of arterial or venous thrombosis to my knowledge.  Does raise question of possible livedo racemosa.  She does not describe any consistent use of space heater or heating blanket or other product to account for erythema ab igne.  Also would introduce a possibility for cryoglobulinemia or polyarteritis nodosa as less likely underlying causes.  High risk  medication use - Plan: Hepatitis B core antibody, IgM, Hepatitis B surface antigen, Hepatitis C antibody - Checking baseline hepatitis screening in anticipation if we do need to start DMARD therapy such as methotrexate.   Degenerative joint disease of the knees Chronic knee pain and swelling with cartilage wear. Previous interventions ineffective. Under orthopedic care for management. - Continue follow-up with orthopedic surgeon Dr. Addie.  Levothyroscoliosis of the lumbar spine Leftward lumbar spine curvature with potential nerve impact causing limb weakness.  No red flags of myelopathy with limitation in movement or strength on exam today.  No gross sensory deficit.  She remains on a high dose of chronic pain medication primarily for her back pain managed through her [place] medical office.  I discussed this would not typically be a symptom explained by her current lab findings more likely degenerative and may need to follow-up in orthopedics if requiring additional intervention.   Orders: Orders Placed This Encounter  Procedures   Anti-DNA antibody, double-stranded   RNP Antibody   Beta-2  glycoprotein antibodies   Cardiolipin antibodies, IgG, IgM, IgA   C3 and C4   Sedimentation rate   C-reactive protein   Hepatitis B core antibody, IgM   Hepatitis B surface antigen   Hepatitis C antibody   CBC with Differential/Platelet   Protein / creatinine ratio, urine   No orders of the defined types were placed in this encounter.   Follow-Up Instructions: Return in about 4 weeks (around 09/15/2023) for New pt f/u 1 mo.   Lonni LELON Ester, MD  Note - This record has been created using [employer].  Chart creation errors have been sought, but may not always  have been located. Such creation errors do not reflect on  the standard of medical care.

## 2023-08-18 ENCOUNTER — Ambulatory Visit: Attending: Internal Medicine | Admitting: Internal Medicine

## 2023-08-18 ENCOUNTER — Encounter: Payer: Self-pay | Admitting: Internal Medicine

## 2023-08-18 VITALS — BP 143/79 | HR 79 | Resp 18 | Ht 64.0 in | Wt 237.9 lb

## 2023-08-18 DIAGNOSIS — M25569 Pain in unspecified knee: Secondary | ICD-10-CM | POA: Insufficient documentation

## 2023-08-18 DIAGNOSIS — G8929 Other chronic pain: Secondary | ICD-10-CM | POA: Insufficient documentation

## 2023-08-18 DIAGNOSIS — Z79899 Other long term (current) drug therapy: Secondary | ICD-10-CM | POA: Insufficient documentation

## 2023-08-18 DIAGNOSIS — R768 Other specified abnormal immunological findings in serum: Secondary | ICD-10-CM | POA: Diagnosis present

## 2023-08-18 DIAGNOSIS — R21 Rash and other nonspecific skin eruption: Secondary | ICD-10-CM | POA: Diagnosis present

## 2023-08-20 LAB — PROTEIN / CREATININE RATIO, URINE
Creatinine, Urine: 108 mg/dL (ref 20–275)
Protein/Creat Ratio: 102 mg/g{creat} (ref 24–184)
Protein/Creatinine Ratio: 0.102 mg/mg{creat} (ref 0.024–0.184)
Total Protein, Urine: 11 mg/dL (ref 5–24)

## 2023-08-22 LAB — C-REACTIVE PROTEIN: CRP: 6.6 mg/L (ref <8.0)

## 2023-08-22 LAB — BETA-2 GLYCOPROTEIN ANTIBODIES
Beta-2 Glyco 1 IgA: <2 U/mL (ref <20.0)
Beta-2 Glyco 1 IgM: <2 U/mL (ref <20.0)
Beta-2 Glyco I IgG: <2 U/mL (ref <20.0)

## 2023-08-22 LAB — CBC WITH DIFFERENTIAL/PLATELET
Absolute Lymphocytes: 2713 {cells}/uL (ref 850–3900)
Absolute Monocytes: 912 {cells}/uL (ref 200–950)
Basophils Absolute: 46 {cells}/uL (ref 0–200)
Basophils Relative: 0.4 %
Eosinophils Absolute: 148 {cells}/uL (ref 15–500)
Eosinophils Relative: 1.3 %
HCT: 41.9 % (ref 35.0–45.0)
Hemoglobin: 13 g/dL (ref 11.7–15.5)
MCH: 28 pg (ref 27.0–33.0)
MCHC: 31 g/dL — ABNORMAL LOW (ref 32.0–36.0)
MCV: 90.1 fL (ref 80.0–100.0)
MPV: 11.9 fL (ref 7.5–12.5)
Monocytes Relative: 8 %
Neutro Abs: 7581 {cells}/uL (ref 1500–7800)
Neutrophils Relative %: 66.5 %
Platelets: 297 Thousand/uL (ref 140–400)
RBC: 4.65 Million/uL (ref 3.80–5.10)
RDW: 11.9 % (ref 11.0–15.0)
Total Lymphocyte: 23.8 %
WBC: 11.4 Thousand/uL — ABNORMAL HIGH (ref 3.8–10.8)

## 2023-08-22 LAB — HEPATITIS C ANTIBODY: Hepatitis C Ab: NONREACTIVE

## 2023-08-22 LAB — ANTI-DNA ANTIBODY, DOUBLE-STRANDED: ds DNA Ab: <1 [IU]/mL

## 2023-08-22 LAB — SEDIMENTATION RATE: Sed Rate: 19 mm/h (ref 0–20)

## 2023-08-22 LAB — RNP ANTIBODY: Ribonucleic Protein(ENA) Antibody, IgG: >8 AI — AB

## 2023-08-22 LAB — CARDIOLIPIN ANTIBODIES, IGG, IGM, IGA
Anticardiolipin IgA: <2 [APL'U]/mL (ref <20.0)
Anticardiolipin IgG: <2 [GPL'U]/mL (ref <20.0)
Anticardiolipin IgM: <2 [MPL'U]/mL (ref <20.0)

## 2023-08-22 LAB — C3 AND C4
C3 Complement: 169 mg/dL (ref 83–193)
C4 Complement: 26 mg/dL (ref 15–57)

## 2023-08-22 LAB — HEPATITIS B SURFACE ANTIGEN: Hepatitis B Surface Ag: NONREACTIVE

## 2023-08-22 LAB — HEPATITIS B CORE ANTIBODY, IGM: Hep B C IgM: NONREACTIVE

## 2023-08-24 ENCOUNTER — Ambulatory Visit (INDEPENDENT_AMBULATORY_CARE_PROVIDER_SITE_OTHER): Admitting: Physician Assistant

## 2023-08-30 ENCOUNTER — Ambulatory Visit: Attending: Internal Medicine | Admitting: Internal Medicine

## 2023-08-30 ENCOUNTER — Ambulatory Visit: Admitting: Nurse Practitioner

## 2023-08-30 ENCOUNTER — Encounter: Payer: Self-pay | Admitting: Internal Medicine

## 2023-08-30 VITALS — BP 144/95 | HR 84 | Resp 16 | Ht 59.0 in | Wt 234.0 lb

## 2023-08-30 DIAGNOSIS — M25561 Pain in right knee: Secondary | ICD-10-CM | POA: Diagnosis present

## 2023-08-30 DIAGNOSIS — G8929 Other chronic pain: Secondary | ICD-10-CM | POA: Diagnosis not present

## 2023-08-30 DIAGNOSIS — R768 Other specified abnormal immunological findings in serum: Secondary | ICD-10-CM | POA: Insufficient documentation

## 2023-08-30 DIAGNOSIS — M25562 Pain in left knee: Secondary | ICD-10-CM | POA: Insufficient documentation

## 2023-08-30 NOTE — Patient Instructions (Addendum)
 You have a positive RNP antibody test that can indicate lupus but there is no evidence it is currently active. Your back pain would not me a typical symptom caused by this condition.  For osteoarthritis several treatments may be beneficial:  - Topical antiinflammatory medicine such as diclofenac or Voltaren can be applied to  affected area as needed. Topical analgesics containing CBD, menthol, or lidocaine  can be tried. - Oral nonsteroidal antiinflammatory drugs (NSAIDs) such as ibuprofen , aleve, celebrex , or mobic are usually helpful for osteoarthritis. - Turmeric has some antiinflammatory effect similar to NSAIDs and may help, if taken as a supplement should not be taken above recommended doses.  - Compressive gloves or sleeve can be helpful to support the joint especially if hurting or swelling with certain activities. - Physical therapy referral can discuss exercises or activity modification to improve symptoms or strength if needed. - Local steroid injection is an option if symptoms become worse and not controlled by the above options.

## 2023-08-30 NOTE — Progress Notes (Signed)
 Office Visit Note  Patient: Andrea Montgomery             Date of Birth: [birth year]           MRN: [id number]             PCP: Hillman Bare, MD Referring: Hillman Bare, MD Visit Date: 08/30/2023   Subjective:  Follow-up (Discuss labs )   Discussed the use of AI scribe software for clinical note transcription with the patient, who gave verbal consent to proceed.  History of Present Illness   Andrea Montgomery is a 48 year old female who presents for follow-up of positive RNP antibodies and joint pain and rashes.  She reports a history of pain attributed to rheumatoid arthritis, experiencing joint pain, particularly in her knees. Steroid injections have been tried in the past, which affected her blood sugar levels. Xray was without severe joint space narrowing or osteophytes. Gel-type injections have not been tried. Knee pain is present also instability with walking and uses a cane for both pain and balance support.  She has a history of low vitamin D  levels and takes over-the-counter vitamin D  supplements, which cause constipation. The supplement is not taken daily due to this side effect. Prenatal iron pills are also taken, contributing to constipation and black stools.  She experiences circulation issues in her hands and feet, noting that they 'fall asleep a lot.' No leg swelling is reported, but knee pain is present.  A rash on her left side on repeat questioning may be associated with prolonged use of a heating pad. The heating pad is used for back pain relief, which is described as severe. She does not recall any particular instance of burn of pain related to heat use. She has a history of back issues, including two discs affecting her nerves, and has received cortisone injections in the past without relief. She is not currently seeing a specialist for her back pain.  She has a history of dry skin on her face, which she associates with lupus. She is concerned about the  potential for lupus to cause rashes.       Previous HPI 08/18/23 Andrea Montgomery is a 48 year old female who presents with joint inflammation and abnormal lab tests with highly positive RNP and elevated sedimentation rate.   She experiences joint inflammation, particularly in her knees and fingers. Her knees swell, and her left knee frequently gives out, leading to a recent fall and striking her right knee that remains somewhat swollen now. 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 She tends to notice her ankle and foot swelling most prominently when blood sugar is high.   She describes a skin condition on her side that has been present for about a year, characterized by a 'weird' appearance and a bruise-like discoloration. This condition has gradually appeared and remains consistent in appearance. She also reports small, painful nodules on her legs that occasionally become discolored. She experiences circulation issues, with her fingertips turning blue or white in cold conditions but these return to a normal appearance between episodes.   She has a history of scoliosis and a slipped disc in her neck. She experiences related symptoms.  She has previously seen Dr. Addie in orthopedics clinic for chronic knee pain but without any severe loss of joint space, including on most recent x-ray checked after recent fall of the right knee.   She does not notice frequent cervical  or axillary lymph nodes.  Does get occasional mouth sores but infrequently.  No known history of blood clots. Labs reviewed RNP >8.0 dsDNA, SSA, SSB, Sm neg ESR 42   Review of Systems  Constitutional:  Positive for fatigue.  HENT:  Negative for mouth sores and mouth dryness.   Eyes:  Negative for dryness.  Respiratory:  Positive for shortness of breath.   Cardiovascular:  Positive for chest pain and palpitations.  Gastrointestinal:  Positive for blood in stool and constipation. Negative for diarrhea.   Endocrine: Negative for increased urination.  Genitourinary:  Negative for involuntary urination.  Musculoskeletal:  Positive for joint pain, gait problem, joint pain, joint swelling, myalgias, muscle weakness, morning stiffness, muscle tenderness and myalgias.  Skin:  Positive for color change and hair loss. Negative for rash and sensitivity to sunlight.  Allergic/Immunologic: Positive for susceptible to infections.  Neurological:  Positive for headaches. Negative for dizziness.  Hematological:  Negative for swollen glands.  Psychiatric/Behavioral:  Positive for depressed mood and sleep disturbance. The patient is nervous/anxious.     PMFS History:  Patient Active Problem List   Diagnosis Date Noted   Positive ANA (antinuclear antibody) 08/18/2023   Rash and other nonspecific skin eruption 08/18/2023   Bacteremia    Constipation    Foot pain, left    UTI (urinary tract infection) due to Enterococcus 03/06/2021   Chronic right-sided low back pain with bilateral sciatica 05/01/2020   Trichomoniasis 07/05/2017   Tobacco abuse 07/19/2014   Knee pain, chronic 03/13/2014   Abnormal MRI, knee 03/13/2014   Menorrhagia with irregular cycle 03/13/2014   Iron deficiency anemia 03/12/2014    Past Medical History:  Diagnosis Date   Anxiety    Asthma    COPD (chronic obstructive pulmonary disease) (HCC)    Degenerative disc disease, lumbar    Depression    Diabetes mellitus    Emphysema of lung (HCC)    Hypertension    Iron deficiency anemia 03/12/2014   Leg weakness    Low back pain    Pneumonia    PONV (postoperative nausea and vomiting)    Sleep apnea     Family History  Problem Relation Age of Onset   Hypertension Mother    Heart disease Mother    Dementia Mother    Clotting disorder Mother    Hypertension Father    Diabetes Father    Lung cancer Father    COPD Father    Emphysema Father    Hypertension Brother    Diabetes Brother    Hypertension Brother    Diabetes  Brother    Autism spectrum disorder Son    Hypertension Daughter    Asthma Daughter    GER disease Daughter    Colon polyps Neg Hx    Colon cancer Neg Hx    Esophageal cancer Neg Hx    Rectal cancer Neg Hx    Stomach cancer Neg Hx    Past Surgical History:  Procedure Laterality Date   ALVEOLOPLASTY Bilateral 10/30/2021   Procedure: ALVEOLOPLASTY;  Surgeon: Sheryle Hamilton, DMD;  Location: [facility] OR;  Service: Oral Surgery;  Laterality: Bilateral;   ceserean section times 2     DILITATION & CURRETTAGE/HYSTROSCOPY WITH HYDROTHERMAL ABLATION N/A 10/25/2014   Procedure: DILATATION & CURETTAGE/HYSTEROSCOPY WITH HYDROTHERMAL ABLATION;  Surgeon: Carlin DELENA Centers, MD;  Location: [facility];  Service: Gynecology;  Laterality: N/A;   TOOTH EXTRACTION N/A 10/30/2021   Procedure: DENTAL RESTORATION/EXTRACTIONS of 3,5,6,8,9,11,12,14,18,19,20,22,23,25,26,27,28,30,31;  Surgeon: Sheryle Hamilton, DMD;  Location: [facility] OR;  Service: Oral Surgery;  Laterality: N/A;   TUBAL LIGATION     tumor removed from leg     Social History   Social History Narrative   Right-handed.   Lives at home with her two children.    No daily caffeine use.    There is no immunization history on file for this patient.   Objective: Vital Signs: BP (!) 144/95 (BP Location: Left Arm, Patient Position: Sitting)   Pulse 84   Resp 16   Ht 4' 11 (1.499 m)   Wt 234 lb (106.1 kg)   LMP 08/30/2023   BMI 47.26 kg/m    Physical Exam Eyes:     Conjunctiva/sclera: Conjunctivae normal.  Cardiovascular:     Rate and Rhythm: Normal rate and regular rhythm.  Pulmonary:     Effort: Pulmonary effort is normal.     Breath sounds: Normal breath sounds.  Lymphadenopathy:     Cervical: No cervical adenopathy.  Skin:    General: Skin is warm and dry.     Findings: Rash (Left chest and flank same as before (media)) present.     Comments: Scattered telangiectasias on fingers and palms, normal capillary refill, no pitting  Normal appearing  nailfold capillaroscopy  Neurological:     Mental Status: She is alert.  Psychiatric:        Mood and Affect: Mood normal.      Musculoskeletal Exam:  Shoulders full ROM no tenderness or swelling Elbows full ROM no tenderness or swelling Wrists full ROM no tenderness or swelling Fingers full ROM no tenderness or swelling Middle and low back muscle tenderness to pressure without radiation Knees full ROM, very painful with full flexion and extension, right knee joint line tenderness to pressure, mild swelling present  Investigation: No additional findings.  Imaging: CT Maxillofacial Wo Contrast Result Date: 08/08/2023 CLINICAL DATA:  Blunt facial trauma. EXAM: CT MAXILLOFACIAL WITHOUT CONTRAST TECHNIQUE: Multidetector CT imaging of the maxillofacial structures was performed. Multiplanar CT image reconstructions were also generated. RADIATION DOSE REDUCTION: This exam was performed according to the departmental dose-optimization program which includes automated exposure control, adjustment of the mA and/or kV according to patient size and/or use of iterative reconstruction technique. COMPARISON:  Head CT 04/20/2022 FINDINGS: Osseous: There is a depressed right medial orbital wall fracture. No entrapment of intraorbital contents. No additional facial bone fractures visualized. Orbits: Globes are normal and symmetric. Optic nerves and extraocular muscles are normal. Evidence of patient's right medial wall orbital fracture with small amount of air over the inferior and medial orbit. Moderate air over the anterior inferior orbital soft tissues. Sinuses: Mild opacification of the right ethmoid air cells likely hemorrhagic debris from patient's adjacent fracture. Remaining sinuses are clear. Soft tissues: Soft tissue swelling and air over the right inferior overall soft tissues. Limited intracranial: No significant or unexpected finding. IMPRESSION: Depressed right medial orbital wall fracture. No  entrapment of intraorbital contents. Associated soft tissue swelling and air over the right inferior orbital soft tissues. Electronically Signed   By: Toribio Agreste M.D.   On: 08/08/2023 14:21   DG Knee Complete 4 Views Right Result Date: 08/08/2023 CLINICAL DATA:  knee pain. EXAM: RIGHT KNEE - COMPLETE 4+ VIEW COMPARISON:  None Available. FINDINGS: No acute fracture or dislocation. No aggressive osseous lesion. The knee joint appears within normal limits. No significant arthritis. No knee effusion or focal soft tissue swelling. No radiopaque foreign bodies. IMPRESSION: No acute osseous abnormality of the right knee  joint. Electronically Signed   By: Ree Molt M.D.   On: 08/08/2023 13:41    Recent Labs: Lab Results  Component Value Date   WBC 11.4 (H) 08/18/2023   HGB 13.0 08/18/2023   PLT 297 08/18/2023   NA 139 04/15/2022   K 4.0 04/15/2022   CL 102 04/15/2022   CO2 28 04/15/2022   GLUCOSE 132 (H) 04/15/2022   BUN 9 04/15/2022   CREATININE 0.76 04/15/2022   BILITOT 0.5 04/15/2022   ALKPHOS 45 04/15/2022   AST 19 04/15/2022   ALT 18 04/15/2022   PROT 7.3 04/15/2022   ALBUMIN 3.8 04/15/2022   CALCIUM 9.2 04/15/2022   GFRAA >60 01/27/2018    Speciality Comments: No specialty comments available.  Procedures:  No procedures performed Allergies: Penicillins, Flovent hfa [fluticasone ], Gabapentin, and Lisinopril   Assessment / Plan:     Visit Diagnoses: Positive ANA (antinuclear antibody) Positive RNP antibody without evidence of active autoimmune disease Positive RNP antibody detected, associated with autoimmune diseases like lupus or rheumatoid arthritis. No clinical or laboratory evidence of active autoimmune disease. Symptoms attributed to osteoarthritis and lumbar radiculopathy. - Monitor for any changes in symptoms or new signs of autoimmune activity.  Chronic pain of both knees Osteoarthritis of knees Severe osteoarthritis with significant cartilage loss causing pain  and joint instability. Previous steroid injections were ineffective and caused side effects related to hyperglycemia. Knee space pretty well preserved on imaging, prior MRI from 2016 without severe chondromalacia though. - Provided information about viscosupplementation injections for knee osteoarthritis.  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 Previous corticosteroid injections were ineffective. No evidence of active autoimmune disease contributing to back pain. - Follow up with back or orthopedic specialist for back pain management.  Vitamin D  deficiency Extremely low vitamin D  levels. Current supplementation with over-the-counter vitamin D  causes constipation, possibly due to concurrent iron supplementation. I recommended she needed to try and maintain on D3 supplementation, possibly without iron and getting that via infusion if needed.  Rash due to chronic heat exposure Left sided rash with negative lab testing now strongly believe this is due to erythema ab igne and no additional workup ordered today. Discussed if change over time, pain, or itching develops we can get dermatology input. Also discussed avoiding very prolonged intense heat application.  Orders: No orders of the defined types were placed in this encounter.  No orders of the defined types were placed in this encounter.    Follow-Up Instructions: Return in about 6 months (around 03/01/2024) for ?RA/?SLE f/u 6mos.   Lonni LELON Ester, MD  Note - This record has been created using [employer].  Chart creation errors have been sought, but may not always  have been located. Such creation errors do not reflect on  the standard of medical care.

## 2023-08-31 ENCOUNTER — Emergency Department (HOSPITAL_COMMUNITY)

## 2023-08-31 ENCOUNTER — Inpatient Hospital Stay (HOSPITAL_COMMUNITY)
Admission: EM | Admit: 2023-08-31 | Discharge: 2023-09-04 | DRG: 641 | Disposition: A | Discharge status: Home / Self Care | Attending: Internal Medicine | Admitting: Internal Medicine

## 2023-08-31 ENCOUNTER — Other Ambulatory Visit: Payer: Self-pay

## 2023-08-31 ENCOUNTER — Encounter: Payer: Self-pay | Admitting: Nurse Practitioner

## 2023-08-31 ENCOUNTER — Ambulatory Visit (INDEPENDENT_AMBULATORY_CARE_PROVIDER_SITE_OTHER): Admitting: Nurse Practitioner

## 2023-08-31 VITALS — BP 130/90 | Ht 63.39 in | Wt 233.0 lb

## 2023-08-31 DIAGNOSIS — Z801 Family history of malignant neoplasm of trachea, bronchus and lung: Secondary | ICD-10-CM

## 2023-08-31 DIAGNOSIS — R112 Nausea with vomiting, unspecified: Principal | ICD-10-CM

## 2023-08-31 DIAGNOSIS — E119 Type 2 diabetes mellitus without complications: Secondary | ICD-10-CM | POA: Diagnosis present

## 2023-08-31 DIAGNOSIS — Z79899 Other long term (current) drug therapy: Secondary | ICD-10-CM

## 2023-08-31 DIAGNOSIS — J4489 Other specified chronic obstructive pulmonary disease: Secondary | ICD-10-CM | POA: Diagnosis present

## 2023-08-31 DIAGNOSIS — N920 Excessive and frequent menstruation with regular cycle: Secondary | ICD-10-CM | POA: Diagnosis not present

## 2023-08-31 DIAGNOSIS — Z832 Family history of diseases of the blood and blood-forming organs and certain disorders involving the immune mechanism: Secondary | ICD-10-CM

## 2023-08-31 DIAGNOSIS — Z7951 Long term (current) use of inhaled steroids: Secondary | ICD-10-CM

## 2023-08-31 DIAGNOSIS — N946 Dysmenorrhea, unspecified: Secondary | ICD-10-CM | POA: Diagnosis not present

## 2023-08-31 DIAGNOSIS — I10 Essential (primary) hypertension: Secondary | ICD-10-CM | POA: Diagnosis present

## 2023-08-31 DIAGNOSIS — E860 Dehydration: Principal | ICD-10-CM | POA: Diagnosis present

## 2023-08-31 DIAGNOSIS — C7951 Secondary malignant neoplasm of bone: Secondary | ICD-10-CM | POA: Diagnosis present

## 2023-08-31 DIAGNOSIS — R1110 Vomiting, unspecified: Secondary | ICD-10-CM | POA: Diagnosis present

## 2023-08-31 DIAGNOSIS — G4730 Sleep apnea, unspecified: Secondary | ICD-10-CM | POA: Diagnosis present

## 2023-08-31 DIAGNOSIS — Z880 Allergy status to penicillin: Secondary | ICD-10-CM

## 2023-08-31 DIAGNOSIS — Z8249 Family history of ischemic heart disease and other diseases of the circulatory system: Secondary | ICD-10-CM

## 2023-08-31 DIAGNOSIS — Z820 Family history of epilepsy and other diseases of the nervous system: Secondary | ICD-10-CM

## 2023-08-31 DIAGNOSIS — Z833 Family history of diabetes mellitus: Secondary | ICD-10-CM

## 2023-08-31 DIAGNOSIS — Z794 Long term (current) use of insulin: Secondary | ICD-10-CM

## 2023-08-31 DIAGNOSIS — F419 Anxiety disorder, unspecified: Secondary | ICD-10-CM | POA: Diagnosis present

## 2023-08-31 DIAGNOSIS — Z888 Allergy status to other drugs, medicaments and biological substances status: Secondary | ICD-10-CM

## 2023-08-31 DIAGNOSIS — D72829 Elevated white blood cell count, unspecified: Secondary | ICD-10-CM | POA: Diagnosis present

## 2023-08-31 DIAGNOSIS — D500 Iron deficiency anemia secondary to blood loss (chronic): Secondary | ICD-10-CM | POA: Diagnosis not present

## 2023-08-31 DIAGNOSIS — F17210 Nicotine dependence, cigarettes, uncomplicated: Secondary | ICD-10-CM | POA: Diagnosis present

## 2023-08-31 DIAGNOSIS — Z9221 Personal history of antineoplastic chemotherapy: Secondary | ICD-10-CM

## 2023-08-31 DIAGNOSIS — Z7984 Long term (current) use of oral hypoglycemic drugs: Secondary | ICD-10-CM

## 2023-08-31 DIAGNOSIS — R252 Cramp and spasm: Secondary | ICD-10-CM | POA: Diagnosis present

## 2023-08-31 DIAGNOSIS — Z825 Family history of asthma and other chronic lower respiratory diseases: Secondary | ICD-10-CM

## 2023-08-31 DIAGNOSIS — M51369 Other intervertebral disc degeneration, lumbar region without mention of lumbar back pain or lower extremity pain: Secondary | ICD-10-CM | POA: Diagnosis present

## 2023-08-31 DIAGNOSIS — J439 Emphysema, unspecified: Secondary | ICD-10-CM | POA: Diagnosis present

## 2023-08-31 LAB — CBC WITH DIFFERENTIAL/PLATELET
Abs Immature Granulocytes: 0.1 K/uL — ABNORMAL HIGH (ref 0.00–0.07)
Basophils Absolute: 0 K/uL (ref 0.0–0.1)
Basophils Relative: 0 %
Eosinophils Absolute: 0 K/uL (ref 0.0–0.5)
Eosinophils Relative: 0 %
HCT: 48.3 % — ABNORMAL HIGH (ref 36.0–46.0)
Hemoglobin: 14.7 g/dL (ref 12.0–15.0)
Immature Granulocytes: 1 %
Lymphocytes Relative: 7 %
Lymphs Abs: 1.3 K/uL (ref 0.7–4.0)
MCH: 27.2 pg (ref 26.0–34.0)
MCHC: 30.4 g/dL (ref 30.0–36.0)
MCV: 89.4 fL (ref 80.0–100.0)
Monocytes Absolute: 0.6 K/uL (ref 0.1–1.0)
Monocytes Relative: 3 %
Neutro Abs: 17.1 K/uL — ABNORMAL HIGH (ref 1.7–7.7)
Neutrophils Relative %: 89 %
Platelets: 382 K/uL (ref 150–400)
RBC: 5.4 MIL/uL — ABNORMAL HIGH (ref 3.87–5.11)
RDW: 13.2 % (ref 11.5–15.5)
WBC: 19.1 K/uL — ABNORMAL HIGH (ref 4.0–10.5)
nRBC: 0.1 % (ref 0.0–0.2)

## 2023-08-31 MED ORDER — HYDROMORPHONE HCL 1 MG/ML IJ SOLN
1.0000 mg | Freq: Once | INTRAMUSCULAR | Status: AC
  Administered 2023-08-31: 1 mg via INTRAVENOUS
  Filled 2023-08-31: qty 1

## 2023-08-31 MED ORDER — ONDANSETRON HCL 4 MG/2ML IJ SOLN
4.0000 mg | Freq: Once | INTRAMUSCULAR | Status: AC
  Administered 2023-08-31: 4 mg via INTRAVENOUS
  Filled 2023-08-31: qty 2

## 2023-08-31 MED ORDER — LACTATED RINGERS IV BOLUS
1000.0000 mL | Freq: Once | INTRAVENOUS | Status: AC
  Administered 2023-08-31: 1000 mL via INTRAVENOUS

## 2023-08-31 NOTE — ED Provider Notes (Signed)
 [place] EMERGENCY DEPARTMENT AT [facility] Provider Note   CSN: [id number] Arrival date & time: 08/31/23  2246     Patient presents with: Dehydration   Andrea Montgomery is a 48 y.o. female.  {Add pertinent medical, surgical, social history, OB history to YEP:67052} Patient with a history of asthma, diabetes, hypertension, bone cancer presents with abdominal pain, nausea and vomiting as well as extremity pain and aching.  States she had chemotherapy 1 week ago.  States she has metastatic bone cancer.  States she has abdominal pain with nausea and vomiting cramping all over.  Vomiting x 4 today that is nonbilious nonbloody.  No fever.  No diarrhea or blood in the stool.  Pain is all over including her chest, abdomen, back.  No pain with urination or blood in the urine.  She has pain cramping all over.  For EMS she was hypertensive and tachycardic.  The history is provided by the patient.       Prior to Admission medications   Medication Sig Start Date End Date Taking? Authorizing Provider  albuterol  (PROVENTIL  HFA;VENTOLIN  HFA) 108 (90 BASE) MCG/ACT inhaler Inhale 2 puffs into the lungs every 6 (six) hours as needed for wheezing or shortness of breath.     [provider]  ALPRAZolam  (XANAX ) 1 MG tablet Take 1 mg by mouth 3 (three) times daily.    [provider]  chlorhexidine  (PERIDEX ) 0.12 % solution Use as directed 15 mLs in the mouth or throat 2 (two) times daily. Patient not taking: Reported on 08/31/2023 10/30/21   Sheryle Hamilton, DMD  clindamycin  (CLEOCIN ) 300 MG capsule Take 300 mg by mouth 3 (three) times daily. Patient not taking: Reported on 08/31/2023    [provider]  Continuous Glucose Sensor (DEXCOM G7 SENSOR) MISC as directed. 08/09/23   [provider]  cyclobenzaprine  (FLEXERIL ) 10 MG tablet Take 0.5-1 tablets (5-10 mg total) by mouth 3 (three) times daily as needed for muscle spasms Patient not taking: Reported on  08/30/2023 11/11/21     cyclobenzaprine  (FLEXERIL ) 10 MG tablet Take 1/2-1 tablet (5-10 mg total) by mouth 3 (three) times daily as needed for muscle spasms Patient not taking: Reported on 08/30/2023 12/10/21     cyclobenzaprine  (FLEXERIL ) 10 MG tablet Take 1/2-1 tablet (5-10 mg total) by mouth 3 (three) times daily as needed for muscle spasms. Patient not taking: Reported on 08/30/2023 04/05/22     cyclobenzaprine  (FLEXERIL ) 10 MG tablet Take 1/2-1 tablet (5-10 mg total) by mouth 3 (three) times daily  as needed for muscle spasms Patient not taking: Reported on 08/30/2023 06/04/22     cyclobenzaprine  (FLEXERIL ) 10 MG tablet Take 1/2-1 tablet (5-10 mg total) by mouth 3 (three) times daily as needed for muscle spasms. Space opioids and muscle relaxer 1-2 hours apart to avoid oversedation. 07/30/22     doxycycline  (VIBRAMYCIN ) 100 MG capsule Take 1 capsule (100 mg total) by mouth 2 (two) times daily. Patient not taking: Reported on 08/31/2023 08/08/23   Neysa Caron PARAS, DO  ergocalciferol  (VITAMIN D2) 1.25 MG (50000 UT) capsule Take 1 capsule (50,000 Units total) by mouth once a week. 05/06/22     Fluticasone -Salmeterol (ADVAIR) 250-50 MCG/DOSE AEPB Inhale 1 puff into the lungs daily. For shortness of breath    [provider]  furosemide  (LASIX ) 40 MG tablet Take 40 mg by mouth 2 (two) times daily.    [provider]  ibuprofen  (ADVIL ,MOTRIN ) 800 MG tablet Take 1 tablet (800 mg total)  by mouth 3 (three) times daily. 04/29/17   Rudy Carlin LABOR, MD  insulin  aspart protamine-insulin  aspart (NOVOLOG  70/30) (70-30) 100 UNIT/ML injection Inject 15 Units into the skin 2 (two) times daily with a meal. Take 15 units in the morning 10 units at lunch and bedtime  4 time daily If blood sugar is over 400    [provider]  insulin  glargine (LANTUS ) 100 UNIT/ML injection Inject 12 Units into the skin at bedtime.    [provider]  ipratropium-albuterol  (DUONEB) 0.5-2.5 (3) MG/3ML SOLN Take  3 mLs by nebulization every 4 (four) hours as needed (shortness of breath). 03/05/21   [provider]  LINZESS  145 MCG CAPS capsule TAKE 1 CAPSULE BY MOUTH DAILY BEFORE BREAKFAST. 11/15/22   Federico Rosario BROCKS, MD  losartan  (COZAAR ) 100 MG tablet Take 100 mg by mouth daily. 04/27/22   [provider]  metFORMIN  (GLUCOPHAGE ) 1000 MG tablet Take 1,000 mg by mouth daily with breakfast.    [provider]  naloxone  (NARCAN ) nasal spray 4 mg/0.1 mL SMARTSIG:Both Nares 05/14/21   [provider]  naloxone  (NARCAN ) nasal spray 4 mg/0.1 mL Place 1 spray in one nostril for 1 dose. Then if another dose needed, alternate nostrils with each dose until help arrives Patient not taking: Reported on 08/30/2023 08/30/22     oxyCODONE -acetaminophen  (PERCOCET) 10-325 MG tablet Take 1 tablet by mouth every 6 (six) hours as needed for pain. Patient not taking: Reported on 08/30/2023 10/30/21   Sheryle Hamilton, DMD  oxyCODONE -acetaminophen  (PERCOCET) 10-325 MG tablet Take 1 tablet by mouth 4 (four) times daily as needed for pain. Patient not taking: Reported on 08/30/2023 07/30/22     oxyCODONE -acetaminophen  (PERCOCET) 10-325 MG tablet Take 1 tablet by mouth 4 (four) times daily as needed for pain 08/30/22     polyethylene glycol (MIRALAX ) packet Take 17 g by mouth daily. 09/23/17   Sofia, Leslie K, PA-C    Allergies: Penicillins, Flovent hfa [fluticasone ], Gabapentin, and Lisinopril    Review of Systems  Constitutional:  Positive for activity change and appetite change. Negative for fever.  HENT:  Negative for congestion and rhinorrhea.   Respiratory:  Negative for chest tightness and shortness of breath.   Cardiovascular:  Positive for chest pain.  Gastrointestinal:  Positive for abdominal pain, nausea and vomiting.  Genitourinary:  Negative for dysuria.  Musculoskeletal:  Positive for arthralgias and myalgias.  Skin:  Negative for rash.  Neurological:  Negative for dizziness, weakness and  headaches.    all other systems are negative except as noted in the HPI and PMH.   Updated Vital Signs BP (!) 183/105 (BP Location: Left Arm)   Pulse (!) 102   Temp 97.7 F (36.5 C) (Axillary)   Resp 18   LMP 08/24/2023 (Exact Date)   SpO2 97%   Physical Exam Vitals and nursing note reviewed.  Constitutional:      General: She is not in acute distress.    Appearance: She is well-developed.     Comments: Uncomfortable  HENT:     Head: Normocephalic and atraumatic.     Mouth/Throat:     Pharynx: No oropharyngeal exudate.  Eyes:     Conjunctiva/sclera: Conjunctivae normal.     Pupils: Pupils are equal, round, and reactive to light.  Neck:     Comments: No meningismus. Cardiovascular:     Rate and Rhythm: Regular rhythm. Tachycardia present.     Heart sounds: Normal heart sounds. No murmur heard. Pulmonary:  Effort: Pulmonary effort is normal. No respiratory distress.     Breath sounds: Normal breath sounds.  Abdominal:     Palpations: Abdomen is soft.     Tenderness: There is abdominal tenderness. There is no guarding or rebound.  Musculoskeletal:        General: No tenderness. Normal range of motion.     Cervical back: Normal range of motion and neck supple.  Skin:    General: Skin is warm.  Neurological:     Mental Status: She is alert and oriented to person, place, and time.     Cranial Nerves: No cranial nerve deficit.     Motor: No abnormal muscle tone.     Coordination: Coordination normal.     Comments:  5/5 strength throughout. CN 2-12 intact.Equal grip strength.   Psychiatric:        Behavior: Behavior normal.     (all labs ordered are listed, but only abnormal results are displayed) Labs Reviewed  CBC WITH DIFFERENTIAL/PLATELET  COMPREHENSIVE METABOLIC PANEL WITH GFR  URINALYSIS, ROUTINE W REFLEX MICROSCOPIC  CK  PREGNANCY, URINE    EKG: None  Radiology: No results found.  {Document cardiac monitor, telemetry assessment procedure when  appropriate:32947} Procedures   Medications Ordered in the ED  HYDROmorphone  (DILAUDID ) injection 1 mg (1 mg Intravenous Given 08/31/23 2330)  ondansetron  (ZOFRAN ) injection 4 mg (4 mg Intravenous Given 08/31/23 2329)  lactated ringers  bolus 1,000 mL (1,000 mLs Intravenous New Bag/Given 08/31/23 2330)      {Click here for ABCD2, HEART and other calculators REFRESH Note before signing:1}                              Medical Decision Making Amount and/or Complexity of Data Reviewed Labs: ordered. Decision-making details documented in ED Course. Radiology: ordered and independent interpretation performed. Decision-making details documented in ED Course. ECG/medicine tests: ordered and independent interpretation performed. Decision-making details documented in ED Course.  Risk Prescription drug management.   Abdominal pain, nausea, vomiting, cramping rollover.  Vital stable, hypertensive, no distress  {Document critical care time when appropriate  Document review of labs and clinical decision tools ie CHADS2VASC2, etc  Document your independent review of radiology images and any outside records  Document your discussion with family members, caretakers and with consultants  Document social determinants of health affecting pt's care  Document your decision making why or why not admission, treatments were needed:32947:::1}   Final diagnoses:  None    ED Discharge Orders     None

## 2023-08-31 NOTE — Progress Notes (Signed)
   Acute Office Visit  Subjective:    Patient ID: Andrea Montgomery, female    DOB: [birth year], 48 y.o.   MRN: [id number]   HPI 48 y.o. [id number] presents as new patient for heavy, painful periods for many years. Menses are regular, heavy bleeding 6-7 day with large clots, using largest depends and changing every 1-2 hours, severe cramping, totally bleeding time 8-9 days. H/O ablation, helped for a while. Interested in definitive surgery. 02/16/23 ASCUS neg HPV. H/O COPD, DM, HTN, anemia (doing oral supplement now, has done infusions in the past). Smoker (down to 2 per day). 06/13/23 Hgb 12.9, A1c 8.1. Reports not having an ultrasound pregnancy 19 years ago.   Patient's last menstrual period was 08/24/2023 (exact date). Period Duration (Days): 8-9 Period Pattern: (!) Irregular Menstrual Flow: Heavy (extra heavy) Menstrual Control: Other (Comment) (period diapers) Dysmenorrhea: (!) Severe Dysmenorrhea Symptoms: Cramping, Nausea, Other (Comment) (hot flashes,vomiting)  Review of Systems  Constitutional: Negative.   Genitourinary:  Positive for menstrual problem.       Objective:    Physical Exam Constitutional:      Appearance: Normal appearance.   GU: Declines  BP (!) 130/90   Ht 5' 3.39 (1.61 m)   Wt 233 lb (105.7 kg)   LMP 08/24/2023 (Exact Date)   BMI 40.77 kg/m  Wt Readings from Last 3 Encounters:  08/31/23 233 lb (105.7 kg)  08/30/23 234 lb (106.1 kg)  08/18/23 237 lb 14.4 oz (107.9 kg)        Assessment & Plan:   Problem List Items Addressed This Visit       Other   Iron deficiency anemia   Other Visit Diagnoses       Menorrhagia with regular cycle    -  Primary   Relevant Orders   US  PELVIS TRANSVAGINAL NON-OB (TV ONLY)     Dysmenorrhea       Relevant Orders   US  PELVIS TRANSVAGINAL NON-OB (TV ONLY)      Plan: Schedule ultrasound and surgical consult.       Andrea DELENA Shutter DNP, 10:59 AM 08/31/2023

## 2023-08-31 NOTE — ED Triage Notes (Signed)
/  Patient brought in by [facility]. Per [facility] patient stated this morning she had intermittent pain to lower extremities and ever camping to both lower legs. PO intake throughout today has been poor started feeling bad yesterday. Last chemo last tuesday. Stage 4 metastic bone cancer. Vitals with medic Hypertensive 192/100 HR 94 BS 133

## 2023-09-01 ENCOUNTER — Emergency Department (HOSPITAL_COMMUNITY)

## 2023-09-01 DIAGNOSIS — R1110 Vomiting, unspecified: Secondary | ICD-10-CM

## 2023-09-01 LAB — URINALYSIS, ROUTINE W REFLEX MICROSCOPIC
Bacteria, UA: NONE SEEN
Bilirubin Urine: NEGATIVE
Glucose, UA: >=500 mg/dL — AB
Ketones, ur: 20 mg/dL — AB
Leukocytes,Ua: NEGATIVE
Nitrite: NEGATIVE
Protein, ur: >=300 mg/dL — AB
Specific Gravity, Urine: 1.01 (ref 1.005–1.030)
pH: 6 (ref 5.0–8.0)

## 2023-09-01 LAB — BLOOD GAS, VENOUS
Acid-Base Excess: 5.7 mmol/L — ABNORMAL HIGH (ref 0.0–2.0)
Bicarbonate: 32.1 mmol/L — ABNORMAL HIGH (ref 20.0–28.0)
O2 Saturation: 97.5 %
Patient temperature: 37
pCO2, Ven: 53 mmHg (ref 44–60)
pH, Ven: 7.39 (ref 7.25–7.43)
pO2, Ven: 87 mmHg — ABNORMAL HIGH (ref 32–45)

## 2023-09-01 LAB — BASIC METABOLIC PANEL WITH GFR
Anion gap: 16 — ABNORMAL HIGH (ref 5–15)
BUN: 10 mg/dL (ref 6–20)
CO2: 24 mmol/L (ref 22–32)
Calcium: 9 mg/dL (ref 8.9–10.3)
Chloride: 97 mmol/L — ABNORMAL LOW (ref 98–111)
Creatinine, Ser: 0.79 mg/dL (ref 0.44–1.00)
GFR, Estimated: >60 mL/min (ref >60)
Glucose, Bld: 246 mg/dL — ABNORMAL HIGH (ref 70–99)
Potassium: 3.9 mmol/L (ref 3.5–5.1)
Sodium: 137 mmol/L (ref 135–145)

## 2023-09-01 LAB — COMPREHENSIVE METABOLIC PANEL WITH GFR
ALT: 22 U/L (ref 0–44)
AST: 32 U/L (ref 15–41)
Albumin: 4.5 g/dL (ref 3.5–5.0)
Alkaline Phosphatase: 74 U/L (ref 38–126)
Anion gap: 19 — ABNORMAL HIGH (ref 5–15)
BUN: 7 mg/dL (ref 6–20)
CO2: 23 mmol/L (ref 22–32)
Calcium: 10.2 mg/dL (ref 8.9–10.3)
Chloride: 94 mmol/L — ABNORMAL LOW (ref 98–111)
Creatinine, Ser: 0.7 mg/dL (ref 0.44–1.00)
GFR, Estimated: >60 mL/min (ref >60)
Glucose, Bld: 293 mg/dL — ABNORMAL HIGH (ref 70–99)
Potassium: 4.5 mmol/L (ref 3.5–5.1)
Sodium: 136 mmol/L (ref 135–145)
Total Bilirubin: 0.6 mg/dL (ref 0.0–1.2)
Total Protein: 8.6 g/dL — ABNORMAL HIGH (ref 6.5–8.1)

## 2023-09-01 LAB — GLUCOSE, CAPILLARY
Glucose-Capillary: 205 mg/dL — ABNORMAL HIGH (ref 70–99)
Glucose-Capillary: 184 mg/dL — ABNORMAL HIGH (ref 70–99)
Glucose-Capillary: 204 mg/dL — ABNORMAL HIGH (ref 70–99)

## 2023-09-01 LAB — CK: Total CK: 318 U/L — ABNORMAL HIGH (ref 38–234)

## 2023-09-01 LAB — PREGNANCY, URINE: Preg Test, Ur: NEGATIVE

## 2023-09-01 LAB — TROPONIN T, HIGH SENSITIVITY
Troponin T High Sensitivity: <15 ng/L (ref 0–19)
Troponin T High Sensitivity: <15 ng/L (ref 0–19)

## 2023-09-01 LAB — LIPASE, BLOOD: Lipase: 11 U/L (ref 11–51)

## 2023-09-01 MED ORDER — OXYCODONE-ACETAMINOPHEN 10-325 MG PO TABS: 1.0000 | ORAL_TABLET | Freq: Four times a day (QID) | ORAL | Status: DC | PRN

## 2023-09-01 MED ORDER — LINACLOTIDE 145 MCG PO CAPS
145.0000 ug | ORAL_CAPSULE | Freq: Every day | ORAL | Status: DC
  Administered 2023-09-02 – 2023-09-03 (×2): 145 ug via ORAL
  Filled 2023-09-01 (×5): qty 1

## 2023-09-01 MED ORDER — ALBUTEROL SULFATE (2.5 MG/3ML) 0.083% IN NEBU: 2.5000 mg | INHALATION_SOLUTION | RESPIRATORY_TRACT | Status: DC | PRN

## 2023-09-01 MED ORDER — FUROSEMIDE 40 MG PO TABS
40.0000 mg | ORAL_TABLET | Freq: Two times a day (BID) | ORAL | Status: DC
  Administered 2023-09-01 – 2023-09-03 (×5): 40 mg via ORAL
  Filled 2023-09-01 (×5): qty 1

## 2023-09-01 MED ORDER — OXYCODONE-ACETAMINOPHEN 5-325 MG PO TABS
1.0000 | ORAL_TABLET | Freq: Four times a day (QID) | ORAL | Status: DC | PRN
  Administered 2023-09-01 – 2023-09-02 (×2): 1 via ORAL
  Filled 2023-09-01 (×2): qty 1

## 2023-09-01 MED ORDER — HYDROMORPHONE HCL 1 MG/ML IJ SOLN
0.5000 mg | INTRAMUSCULAR | Status: DC | PRN
  Administered 2023-09-01 – 2023-09-02 (×8): 1 mg via INTRAVENOUS
  Administered 2023-09-03: 0.5 mg via INTRAVENOUS
  Administered 2023-09-03 (×3): 1 mg via INTRAVENOUS
  Filled 2023-09-01 (×11): qty 1

## 2023-09-01 MED ORDER — ONDANSETRON HCL 4 MG PO TABS
4.0000 mg | ORAL_TABLET | Freq: Four times a day (QID) | ORAL | Status: DC | PRN
  Administered 2023-09-02: 4 mg via ORAL
  Filled 2023-09-01: qty 1

## 2023-09-01 MED ORDER — SODIUM CHLORIDE 0.9 % IV BOLUS
1000.0000 mL | Freq: Once | INTRAVENOUS | Status: AC
  Administered 2023-09-01: 1000 mL via INTRAVENOUS

## 2023-09-01 MED ORDER — ENOXAPARIN SODIUM 60 MG/0.6ML IJ SOSY
55.0000 mg | PREFILLED_SYRINGE | Freq: Every day | INTRAMUSCULAR | Status: DC
  Administered 2023-09-01 – 2023-09-02 (×2): 55 mg via SUBCUTANEOUS
  Filled 2023-09-01 (×2): qty 0.6

## 2023-09-01 MED ORDER — NICOTINE 14 MG/24HR TD PT24
14.0000 mg | MEDICATED_PATCH | Freq: Every day | TRANSDERMAL | Status: DC
  Administered 2023-09-01 – 2023-09-04 (×4): 14 mg via TRANSDERMAL
  Filled 2023-09-01 (×4): qty 1

## 2023-09-01 MED ORDER — IOHEXOL 350 MG/ML SOLN
100.0000 mL | Freq: Once | INTRAVENOUS | Status: AC | PRN
  Administered 2023-09-01: 100 mL via INTRAVENOUS

## 2023-09-01 MED ORDER — INSULIN GLARGINE 100 UNIT/ML ~~LOC~~ SOLN
8.0000 [IU] | Freq: Every day | SUBCUTANEOUS | Status: DC
  Administered 2023-09-01: 8 [IU] via SUBCUTANEOUS
  Filled 2023-09-01: qty 0.08

## 2023-09-01 MED ORDER — TRAZODONE HCL 50 MG PO TABS
25.0000 mg | ORAL_TABLET | Freq: Every evening | ORAL | Status: DC | PRN
  Administered 2023-09-02: 25 mg via ORAL
  Filled 2023-09-01: qty 1

## 2023-09-01 MED ORDER — LACTATED RINGERS IV BOLUS: 1000.0000 mL | Freq: Once | INTRAVENOUS | Status: DC

## 2023-09-01 MED ORDER — HYDROMORPHONE HCL 1 MG/ML IJ SOLN
1.0000 mg | Freq: Once | INTRAMUSCULAR | Status: AC
  Administered 2023-09-01: 1 mg via INTRAVENOUS
  Filled 2023-09-01: qty 1

## 2023-09-01 MED ORDER — ALPRAZOLAM 0.5 MG PO TABS
1.0000 mg | ORAL_TABLET | Freq: Three times a day (TID) | ORAL | Status: DC
  Administered 2023-09-01 – 2023-09-04 (×8): 1 mg via ORAL
  Filled 2023-09-01 (×9): qty 2

## 2023-09-01 MED ORDER — ACETAMINOPHEN 650 MG RE SUPP: 650.0000 mg | Freq: Four times a day (QID) | RECTAL | Status: DC | PRN

## 2023-09-01 MED ORDER — INSULIN ASPART 100 UNIT/ML IJ SOLN
0.0000 [IU] | Freq: Every day | INTRAMUSCULAR | Status: DC
  Administered 2023-09-01 – 2023-09-03 (×2): 2 [IU] via SUBCUTANEOUS
  Filled 2023-09-01: qty 0.05

## 2023-09-01 MED ORDER — CYCLOBENZAPRINE HCL 10 MG PO TABS
5.0000 mg | ORAL_TABLET | Freq: Three times a day (TID) | ORAL | Status: DC | PRN
  Administered 2023-09-01: 5 mg via ORAL
  Administered 2023-09-02 – 2023-09-03 (×2): 10 mg via ORAL
  Filled 2023-09-01 (×3): qty 1

## 2023-09-01 MED ORDER — ONDANSETRON HCL 4 MG/2ML IJ SOLN
4.0000 mg | Freq: Four times a day (QID) | INTRAMUSCULAR | Status: DC | PRN
  Administered 2023-09-01 – 2023-09-03 (×4): 4 mg via INTRAVENOUS
  Filled 2023-09-01 (×4): qty 2

## 2023-09-01 MED ORDER — OXYCODONE HCL 5 MG PO TABS
5.0000 mg | ORAL_TABLET | Freq: Four times a day (QID) | ORAL | Status: DC | PRN
  Administered 2023-09-01: 5 mg via ORAL
  Filled 2023-09-01: qty 1

## 2023-09-01 MED ORDER — ACETAMINOPHEN 325 MG PO TABS: 650.0000 mg | ORAL_TABLET | Freq: Four times a day (QID) | ORAL | Status: DC | PRN

## 2023-09-01 MED ORDER — INSULIN ASPART 100 UNIT/ML IJ SOLN
0.0000 [IU] | Freq: Three times a day (TID) | INTRAMUSCULAR | Status: DC
  Administered 2023-09-01: 3 [IU] via SUBCUTANEOUS
  Administered 2023-09-01 – 2023-09-02 (×3): 5 [IU] via SUBCUTANEOUS
  Administered 2023-09-02: 3 [IU] via SUBCUTANEOUS
  Administered 2023-09-03: 5 [IU] via SUBCUTANEOUS
  Administered 2023-09-03 – 2023-09-04 (×2): 3 [IU] via SUBCUTANEOUS
  Administered 2023-09-04: 5 [IU] via SUBCUTANEOUS
  Filled 2023-09-01: qty 0.15

## 2023-09-01 NOTE — Plan of Care (Signed)
  Problem: Metabolic: Goal: Ability to maintain appropriate glucose levels will improve Outcome: Progressing   Problem: Nutritional: Goal: Maintenance of adequate nutrition will improve Outcome: Progressing   Problem: Clinical Measurements: Goal: Will remain free from infection Outcome: Progressing   Problem: Clinical Measurements: Goal: Cardiovascular complication will be avoided Outcome: Progressing   Problem: Pain Managment: Goal: General experience of comfort will improve and/or be controlled Outcome: Progressing

## 2023-09-01 NOTE — ED Notes (Signed)
 Pt was being disconnected to go upstairs and reported chest pain. EKG obtained and hospitalist notified.

## 2023-09-01 NOTE — H&P (Signed)
 History and Physical  Andrea Montgomery DOB: 27-Sep-1975 DOA: 08/31/2023  PCP: Hillman Bare, MD   Chief Complaint: Body cramps, vomiting  HPI: Andrea Montgomery is a 48 y.o. female with medical history significant for COPD on room air, insulin -dependent type 2 diabetes, hypertension being admitted to the hospital with intractable nausea and vomiting.  She states that she does get intermittent spasms all over her body, she takes Flexeril  for this as needed.  She is also followed by rheumatology due to joint pain, rashes and RNP positive antibodies.  She states that yesterday she started having pretty severe cramps all over her body, this was causing her nausea and vomiting which was intractable.  She does feel a little better this morning.  Extensive workup in the ER including lab work and imaging does not show anything acute.  She has also mention to ER staff that she has stage IV metastatic bone cancer, and she got recent chemotherapy.  She tells me that she sees a Dr. Vicci on [address], but that he is not part of the [facility] health system.  Review of Systems: Please see HPI for pertinent positives and negatives. A complete 10 system review of systems are otherwise negative.  Past Medical History:  Diagnosis Date   Anxiety    Asthma    COPD (chronic obstructive pulmonary disease) (HCC)    Degenerative disc disease, lumbar    Depression    Diabetes mellitus    Emphysema of lung (HCC)    Hypertension    Iron deficiency anemia 03/12/2014   Leg weakness    Low back pain    Pneumonia    PONV (postoperative nausea and vomiting)    Sleep apnea    Past Surgical History:  Procedure Laterality Date   ALVEOLOPLASTY Bilateral 10/30/2021   Procedure: ALVEOLOPLASTY;  Surgeon: Sheryle Hamilton, DMD;  Location: [facility] OR;  Service: Oral Surgery;  Laterality: Bilateral;   ceserean section times 2     DILITATION & CURRETTAGE/HYSTROSCOPY WITH HYDROTHERMAL ABLATION N/A 10/25/2014    Procedure: DILATATION & CURETTAGE/HYSTEROSCOPY WITH HYDROTHERMAL ABLATION;  Surgeon: Carlin DELENA Centers, MD;  Location: [facility] ORS;  Service: Gynecology;  Laterality: N/A;   TOOTH EXTRACTION N/A 10/30/2021   Procedure: DENTAL RESTORATION/EXTRACTIONS of 3,5,6,8,9,11,12,14,18,19,20,22,23,25,26,27,28,30,31;  Surgeon: Sheryle Hamilton, DMD;  Location: [facility] OR;  Service: Oral Surgery;  Laterality: N/A;   TUBAL LIGATION     tumor removed from leg     Social History:  reports that she has been smoking cigarettes. She has a 5.4 pack-year smoking history. She has been exposed to tobacco smoke. She has never used smokeless tobacco. She reports that she does not drink alcohol and does not use drugs.  Allergies  Allergen Reactions   Penicillins Anaphylaxis   Flovent Hfa [Fluticasone ]     migraine   Gabapentin Swelling   Lisinopril     Facial swelling    Family History  Problem Relation Age of Onset   Hypertension Mother    Heart disease Mother    Dementia Mother    Clotting disorder Mother    Hypertension Father    Diabetes Father    Lung cancer Father    COPD Father    Emphysema Father    Hypertension Brother    Diabetes Brother    Hypertension Brother    Diabetes Brother    Autism spectrum disorder Son    Hypertension Daughter    Asthma Daughter    GER disease Daughter    Colon polyps  Neg Hx    Colon cancer Neg Hx    Esophageal cancer Neg Hx    Rectal cancer Neg Hx    Stomach cancer Neg Hx      Prior to Admission medications   Medication Sig Start Date End Date Taking? Authorizing Provider  ALPRAZolam  (XANAX ) 1 MG tablet Take 1 mg by mouth 3 (three) times daily.   Yes [provider]  cyclobenzaprine  (FLEXERIL ) 10 MG tablet Take 0.5-1 tablets (5-10 mg total) by mouth 3 (three) times daily as needed for muscle spasms 11/11/21  Yes   ergocalciferol  (VITAMIN D2) 1.25 MG (50000 UT) capsule Take 1 capsule (50,000 Units total) by mouth once a week. 05/06/22  Yes   Fluticasone -Salmeterol  (ADVAIR) 250-50 MCG/DOSE AEPB Inhale 1 puff into the lungs daily as needed. For shortness of breath   Yes [provider]  furosemide  (LASIX ) 40 MG tablet Take 40 mg by mouth 2 (two) times daily.   Yes [provider]  insulin  aspart protamine-insulin  aspart (NOVOLOG  70/30) (70-30) 100 UNIT/ML injection Inject 15 Units into the skin 2 (two) times daily with a meal. Take 15 units in the morning 10 units at lunch and bedtime  4 time daily If blood sugar is over 400   Yes [provider]  insulin  glargine (LANTUS ) 100 UNIT/ML injection Inject 12 Units into the skin at bedtime.   Yes [provider]  ipratropium-albuterol  (DUONEB) 0.5-2.5 (3) MG/3ML SOLN Take 3 mLs by nebulization every 4 (four) hours as needed (shortness of breath). 03/05/21  Yes [provider]  LINZESS  145 MCG CAPS capsule TAKE 1 CAPSULE BY MOUTH DAILY BEFORE BREAKFAST. 11/15/22  Yes Federico Rosario BROCKS, MD  losartan  (COZAAR ) 100 MG tablet Take 100 mg by mouth daily. 04/27/22  Yes [provider]  metFORMIN  (GLUCOPHAGE ) 1000 MG tablet Take 1,000 mg by mouth daily with breakfast.   Yes [provider]  naloxone  (NARCAN ) nasal spray 4 mg/0.1 mL Place 1 spray into the nose once. 05/14/21  Yes [provider]  oxyCODONE -acetaminophen  (PERCOCET) 10-325 MG tablet Take 1 tablet by mouth 4 (four) times daily as needed for pain 08/30/22  Yes   albuterol  (PROVENTIL  HFA;VENTOLIN  HFA) 108 (90 BASE) MCG/ACT inhaler Inhale 2 puffs into the lungs every 6 (six) hours as needed for wheezing or shortness of breath.     [provider]  Continuous Glucose Sensor (DEXCOM G7 SENSOR) MISC as directed. 08/09/23   [provider]    Physical Exam: BP 131/60 (BP Location: Left Arm)   Pulse 86   Temp (!) 97.4 F (36.3 C) (Oral)   Resp 18   LMP 08/24/2023 (Exact Date)   SpO2 95%  General:  Alert, oriented, calm, in no acute distress, her significant other is at the bedside.   She is resting comfortably Cardiovascular: RRR, no murmurs or rubs, no peripheral edema  Respiratory: clear to auscultation bilaterally, no wheezes, no crackles  Abdomen: soft, nontender, nondistended, normal bowel tones heard  Skin: dry, no rashes  Musculoskeletal: no joint effusions, normal range of motion  Psychiatric: appropriate affect, normal speech  Neurologic: extraocular muscles intact, clear speech, moving all extremities with intact sensorium         Labs on Admission:  Basic Metabolic Panel: Recent Labs  Lab 08/31/23 2310 09/01/23 0227  NA 136 137  K 4.5 3.9  CL 94* 97*  CO2 23 24  GLUCOSE 293* 246*  BUN 7 10  CREATININE 0.70 0.79  CALCIUM 10.2 9.0  Liver Function Tests: Recent Labs  Lab 08/31/23 2310  AST 32  ALT 22  ALKPHOS 74  BILITOT 0.6  PROT 8.6*  ALBUMIN 4.5   No results for input(s): LIPASE, AMYLASE in the last 168 hours. No results for input(s): AMMONIA  in the last 168 hours. CBC: Recent Labs  Lab 08/31/23 2310  WBC 19.1*  NEUTROABS 17.1*  HGB 14.7  HCT 48.3*  MCV 89.4  PLT 382   Cardiac Enzymes: Recent Labs  Lab 08/31/23 2310  CKTOTAL 318*   BNP (last 3 results) No results for input(s): BNP in the last 8760 hours.  ProBNP (last 3 results) No results for input(s): PROBNP in the last 8760 hours.  CBG: No results for input(s): GLUCAP in the last 168 hours.  Radiological Exams on Admission: CT Angio Chest/Abd/Pel for Dissection W and/or Wo Contrast Result Date: 09/01/2023 CLINICAL DATA:  Sudden onset chest and abdominal and leg pain, initial encounter EXAM: CT ANGIOGRAPHY CHEST, ABDOMEN AND PELVIS TECHNIQUE: Non-contrast CT of the chest was initially obtained. Multidetector CT imaging through the chest, abdomen and pelvis was performed using the standard protocol during bolus administration of intravenous contrast. Multiplanar reconstructed images and MIPs were obtained and reviewed to evaluate the vascular anatomy.  RADIATION DOSE REDUCTION: This exam was performed according to the departmental dose-optimization program which includes automated exposure control, adjustment of the mA and/or kV according to patient size and/or use of iterative reconstruction technique. CONTRAST:  OMNIPAQUE  IOHEXOL  350 MG/ML SOLN COMPARISON:  Chest x-ray from earlier in the same day. CT from 03/04/2021 FINDINGS: CTA CHEST FINDINGS Cardiovascular: Initial precontrast images show no aneurysmal dilatation of the aorta. Postcontrast images show no evidence of dissection. No cardiac enlargement is seen. The pulmonary artery as visualized is within normal limits without filling defect to suggest pulmonary embolism. Mediastinum/Nodes: Thoracic inlet is within normal limits. No hilar or mediastinal adenopathy is noted. The esophagus as visualized is within normal limits. Lungs/Pleura: Lungs are well aerated bilaterally. No focal infiltrate or sizable effusion is seen. No parenchymal nodules are noted. Mild mosaic attenuation is noted consistent with air trapping. Musculoskeletal: Degenerative change of the thoracic spine is noted. No acute rib abnormality is seen. Review of the MIP images confirms the above findings. CTA ABDOMEN AND PELVIS FINDINGS VASCULAR Aorta: Normal caliber aorta without aneurysm, dissection, vasculitis or significant stenosis. Celiac: Patent without evidence of aneurysm, dissection, vasculitis or significant stenosis. SMA: Patent without evidence of aneurysm, dissection, vasculitis or significant stenosis. Renals: Both renal arteries are patent without evidence of aneurysm, dissection, vasculitis, fibromuscular dysplasia or significant stenosis. IMA: Patent without evidence of aneurysm, dissection, vasculitis or significant stenosis. Inflow: Iliacs show atherosclerotic calcification without aneurysmal dilatation or focal stenosis. Veins: No specific venous abnormality is noted. Normal variant left retroaortic renal vein is  seen. Review of the MIP images confirms the above findings. NON-VASCULAR Hepatobiliary: Fatty infiltration of the liver is noted. The gallbladder is within normal limits. Pancreas: Unremarkable. No pancreatic ductal dilatation or surrounding inflammatory changes. Spleen: Normal in size without focal abnormality. Adrenals/Urinary Tract: Adrenal glands are mildly thickened although no focal mass lesion is seen. The kidneys demonstrate a normal enhancement pattern bilaterally. No calculi or obstructive changes are noted. The bladder is partially distended. Stomach/Bowel: No obstructive or inflammatory changes of the colon are noted. The appendix is unremarkable. Small bowel and stomach are within normal limits. Lymphatic: No significant lymphadenopathy is noted. Reproductive: Uterus and bilateral adnexa are unremarkable. Other: Small fat containing umbilical hernia is noted. No free  fluid is seen. Musculoskeletal: Degenerative changes are noted at L4-5 with endplate sclerosis. No acute abnormality noted. Review of the MIP images confirms the above findings. IMPRESSION: CTA of the chest: No evidence of aortic abnormality or pulmonary emboli. No acute abnormality seen. CTA of the abdomen and pelvis: Atherosclerotic calcifications are seen although no acute arterial abnormality is noted. Fatty liver. No acute abnormality is noted to correspond with the given clinical history. Electronically Signed   By: Oneil Devonshire M.D.   On: 09/01/2023 01:00   DG Chest Portable 1 View Result Date: 08/31/2023 CLINICAL DATA:  History of stage IV metastatic bone cancer presenting with intermittent lower extremity pain. EXAM: PORTABLE CHEST 1 VIEW COMPARISON:  April 15, 2022 FINDINGS: The heart size and mediastinal contours are within normal limits. Both lungs are clear. The visualized skeletal structures are unremarkable. IMPRESSION: No active disease. Electronically Signed   By: Suzen Dials M.D.   On: 08/31/2023 23:45    Assessment/Plan Andrea Montgomery is a 48 y.o. female with medical history significant for COPD on room air, insulin -dependent type 2 diabetes, hypertension being admitted to the hospital with intractable nausea and vomiting.  Intractable nausea and vomiting-unclear etiology, lab work and imaging without significant abnormality.  Patient is feeling little bit better, but does not feel quite ready to go home. -Observation admission -Clear liquid diet, will advance as tolerated -Pain and nausea medication as needed  COPD-stable on room air, without evidence of acute exacerbation  Type 2 diabetes-continue basal bolus insulin  dosing, carb modified diet once advanced  Anxiety-Xanax  as needed  Cramps-patient states these are chronic, and etiology is unclear. -Flexeril  5 to 10 mg p.o. 3 times daily as needed  DVT prophylaxis: Lovenox      Code Status: Full Code  Consults called: None  Admission status: Observation  Time spent: 56 minutes  Kyreese Chio CHRISTELLA Gail MD [facility] Pager [phone]  If 7PM-7AM, please contact night-coverage [email or web address] Password [email or web address]  09/01/2023, 8:30 AM

## 2023-09-01 NOTE — Hospital Course (Signed)
  48 year old female history of menorrhagia/dysmenorrhea, anxiety, depression,  COPD, insulin -dependent DM type II, sleep apnea, chronic lower back pain opioid dependence, and history of left fifth toe osteomyelitis presented to emergency department with complaining of nausea vomiting started after chemotherapy for stage IV metastatic bone cancer??? Reviewed patient's charts very thoroughly unable to find out any records of cancer/radiation on the chart review.  Dr. Carita also reviewed the chart unable to find any records.  Not sure from however it is coming from or the patient is claiming/concern for malingering.  Patient reported she is unable to keep down anything due to intractable nausea vomiting. At Edition to ED patient found hypertensive and tachycardic.  Blood pressure has been improved. CMP showing elevated blood glucose 293 normal bicarb and a slightly vitamin gap 19.  Elevated CK3 319.  CBC showing leukocytosis 19.1, stable H&H and normal platelet count.  Pending pregnancy test.  Pending lipase, VBG.  Not sure why troponin has been checked in the ED.  Patient denies any chest pain. EKG showing normal sinus rhythm heart rate 97, abnormal R progression and borderline prolonged QT interval.  Given patient is stating that she has bone cancer in the ED CT angio chest abdomen pelvis have been obtained which did not show any evidence of PE, aortic abnormality.  CT abdomen pelvis no abnormality.  Fatty liver.  In the ED patient has been treated with Dilaudid , 2 L of LR bolus, Zofran .  Hospitalist has been consulted for further evaluation management of intractable nausea vomiting.

## 2023-09-02 ENCOUNTER — Encounter (HOSPITAL_COMMUNITY): Payer: Self-pay | Admitting: Internal Medicine

## 2023-09-02 DIAGNOSIS — J4489 Other specified chronic obstructive pulmonary disease: Secondary | ICD-10-CM | POA: Diagnosis present

## 2023-09-02 DIAGNOSIS — I10 Essential (primary) hypertension: Secondary | ICD-10-CM | POA: Diagnosis present

## 2023-09-02 DIAGNOSIS — Z833 Family history of diabetes mellitus: Secondary | ICD-10-CM | POA: Diagnosis not present

## 2023-09-02 DIAGNOSIS — D72829 Elevated white blood cell count, unspecified: Secondary | ICD-10-CM

## 2023-09-02 DIAGNOSIS — C7951 Secondary malignant neoplasm of bone: Secondary | ICD-10-CM | POA: Diagnosis present

## 2023-09-02 DIAGNOSIS — M51369 Other intervertebral disc degeneration, lumbar region without mention of lumbar back pain or lower extremity pain: Secondary | ICD-10-CM | POA: Diagnosis present

## 2023-09-02 DIAGNOSIS — F17210 Nicotine dependence, cigarettes, uncomplicated: Secondary | ICD-10-CM | POA: Diagnosis present

## 2023-09-02 DIAGNOSIS — E119 Type 2 diabetes mellitus without complications: Secondary | ICD-10-CM

## 2023-09-02 DIAGNOSIS — Z820 Family history of epilepsy and other diseases of the nervous system: Secondary | ICD-10-CM | POA: Diagnosis not present

## 2023-09-02 DIAGNOSIS — Z79899 Other long term (current) drug therapy: Secondary | ICD-10-CM | POA: Diagnosis not present

## 2023-09-02 DIAGNOSIS — Z832 Family history of diseases of the blood and blood-forming organs and certain disorders involving the immune mechanism: Secondary | ICD-10-CM | POA: Diagnosis not present

## 2023-09-02 DIAGNOSIS — F419 Anxiety disorder, unspecified: Secondary | ICD-10-CM

## 2023-09-02 DIAGNOSIS — Z8249 Family history of ischemic heart disease and other diseases of the circulatory system: Secondary | ICD-10-CM | POA: Diagnosis not present

## 2023-09-02 DIAGNOSIS — Z794 Long term (current) use of insulin: Secondary | ICD-10-CM

## 2023-09-02 DIAGNOSIS — Z7984 Long term (current) use of oral hypoglycemic drugs: Secondary | ICD-10-CM | POA: Diagnosis not present

## 2023-09-02 DIAGNOSIS — Z9221 Personal history of antineoplastic chemotherapy: Secondary | ICD-10-CM | POA: Diagnosis not present

## 2023-09-02 DIAGNOSIS — Z801 Family history of malignant neoplasm of trachea, bronchus and lung: Secondary | ICD-10-CM | POA: Diagnosis not present

## 2023-09-02 DIAGNOSIS — Z7951 Long term (current) use of inhaled steroids: Secondary | ICD-10-CM | POA: Diagnosis not present

## 2023-09-02 DIAGNOSIS — G4730 Sleep apnea, unspecified: Secondary | ICD-10-CM | POA: Diagnosis present

## 2023-09-02 DIAGNOSIS — Z825 Family history of asthma and other chronic lower respiratory diseases: Secondary | ICD-10-CM | POA: Diagnosis not present

## 2023-09-02 DIAGNOSIS — R1110 Vomiting, unspecified: Secondary | ICD-10-CM | POA: Diagnosis not present

## 2023-09-02 DIAGNOSIS — E860 Dehydration: Secondary | ICD-10-CM | POA: Diagnosis present

## 2023-09-02 DIAGNOSIS — Z880 Allergy status to penicillin: Secondary | ICD-10-CM | POA: Diagnosis not present

## 2023-09-02 DIAGNOSIS — Z888 Allergy status to other drugs, medicaments and biological substances status: Secondary | ICD-10-CM | POA: Diagnosis not present

## 2023-09-02 DIAGNOSIS — J439 Emphysema, unspecified: Secondary | ICD-10-CM | POA: Diagnosis present

## 2023-09-02 DIAGNOSIS — R252 Cramp and spasm: Secondary | ICD-10-CM

## 2023-09-02 LAB — CBC
HCT: 42.8 % (ref 36.0–46.0)
Hemoglobin: 12.7 g/dL (ref 12.0–15.0)
MCH: 27.7 pg (ref 26.0–34.0)
MCHC: 29.7 g/dL — ABNORMAL LOW (ref 30.0–36.0)
MCV: 93.2 fL (ref 80.0–100.0)
Platelets: 305 K/uL (ref 150–400)
RBC: 4.59 MIL/uL (ref 3.87–5.11)
RDW: 13.6 % (ref 11.5–15.5)
WBC: 11.1 K/uL — ABNORMAL HIGH (ref 4.0–10.5)
nRBC: 0 % (ref 0.0–0.2)

## 2023-09-02 LAB — GLUCOSE, CAPILLARY
Glucose-Capillary: 202 mg/dL — ABNORMAL HIGH (ref 70–99)
Glucose-Capillary: 183 mg/dL — ABNORMAL HIGH (ref 70–99)
Glucose-Capillary: 207 mg/dL — ABNORMAL HIGH (ref 70–99)
Glucose-Capillary: 128 mg/dL — ABNORMAL HIGH (ref 70–99)

## 2023-09-02 LAB — BASIC METABOLIC PANEL WITH GFR
Anion gap: 12 (ref 5–15)
BUN: 12 mg/dL (ref 6–20)
CO2: 24 mmol/L (ref 22–32)
Calcium: 8.8 mg/dL — ABNORMAL LOW (ref 8.9–10.3)
Chloride: 100 mmol/L (ref 98–111)
Creatinine, Ser: 0.83 mg/dL (ref 0.44–1.00)
GFR, Estimated: >60 mL/min (ref >60)
Glucose, Bld: 209 mg/dL — ABNORMAL HIGH (ref 70–99)
Potassium: 3.7 mmol/L (ref 3.5–5.1)
Sodium: 137 mmol/L (ref 135–145)

## 2023-09-02 LAB — HEMOGLOBIN A1C
Hgb A1c MFr Bld: 9 % — ABNORMAL HIGH (ref 4.8–5.6)
Mean Plasma Glucose: 212 mg/dL

## 2023-09-02 LAB — MAGNESIUM: Magnesium: 1.8 mg/dL (ref 1.7–2.4)

## 2023-09-02 MED ORDER — SODIUM CHLORIDE 0.9 % IV SOLN: INTRAVENOUS | Status: DC

## 2023-09-02 MED ORDER — INSULIN GLARGINE 100 UNIT/ML ~~LOC~~ SOLN
12.0000 [IU] | Freq: Every day | SUBCUTANEOUS | Status: DC
  Administered 2023-09-02 – 2023-09-03 (×2): 12 [IU] via SUBCUTANEOUS
  Filled 2023-09-02 (×3): qty 0.12

## 2023-09-02 MED ORDER — IPRATROPIUM-ALBUTEROL 0.5-2.5 (3) MG/3ML IN SOLN: 3.0000 mL | RESPIRATORY_TRACT | Status: DC | PRN

## 2023-09-02 MED ORDER — FLUTICASONE FUROATE-VILANTEROL 200-25 MCG/ACT IN AEPB
1.0000 | INHALATION_SPRAY | Freq: Every day | RESPIRATORY_TRACT | Status: DC
  Administered 2023-09-03 – 2023-09-04 (×2): 1 via RESPIRATORY_TRACT
  Filled 2023-09-02: qty 28

## 2023-09-02 NOTE — Progress Notes (Signed)
 Pt is high fall risk, pt has nonskid socks and bed alarm on however the fiance is at bedside and states he helps her up to the bathroom. This nurse witnessed pt walk steadily with fiance and they prefer for him to help her. Pt and fiance educated on fall risk protocols.

## 2023-09-02 NOTE — Plan of Care (Signed)

## 2023-09-02 NOTE — Progress Notes (Signed)
 PROGRESS NOTE    Andrea Montgomery  FMW:[id number] DOB: 12-May-1975 DOA: 08/31/2023 PCP: Hillman Bare, MD    Chief Complaint  Patient presents with   Dehydration    Brief Narrative:  Patient is a 48 year old female history of COPD on room air, insulin -dependent diabetes mellitus type 2, hypertension admitted to the hospital intractable nausea and vomiting.   Assessment & Plan:   Principal Problem:   Intractable vomiting Active Problems:   Leukocytosis   Diabetes mellitus type 2, insulin  dependent (HCC)   Cramps, muscle, general   Anxiety  #1 intractable nausea and vomiting -??  Etiology. -Patient states that with nausea and emesis on clear liquids. - CT angiogram chest abdomen and pelvis with no acute abnormalities noted. - Lab work with no acute abnormalities noted. - Continue clear liquid diet, IV fluids, IV antiemetics, pain management, supportive care.  2.  COPD -Stable.  3.  Diabetes mellitus type 2 -Hemoglobin A1c noted at 9.0. - CBG 202 this morning. - Increase Lantus  to 12 units daily. - SSI.  4.  Anxiety -Continue home regimen Xanax .  5.  Cramps -Patient noted cramps to be chronic without clear etiology. - Continue home regimen of Flexeril  as needed.  6.  Leukocytosis -Likely reactive leukocytosis. - Patient with no signs of infection. - Leukocytosis trending down.    DVT prophylaxis: Lovenox  Code Status: Full Family Communication: Updated patient and fianc at bedside. Disposition: Home when clinically improved with no further nausea and emesis and tolerating oral intake hopefully in the next 24 to 48 hours.  Status is: Inpatient The patient will require care spanning > 2 midnights and should be moved to inpatient because: Severity of illness   Consultants:  None  Procedures:  CT angiogram chest abdomen and pelvis 09/01/2023 Chest x-ray 08/31/2022  Antimicrobials:  Anti-infectives (From admission, onward)    None          Subjective: Patient still with some nausea and emesis this morning.  Stated unable to keep any of her clear liquids now.  Denies any chest pain or shortness of breath.  Fianc at bedside.  Objective: Vitals:   09/01/23 1729 09/01/23 2055 09/02/23 0509 09/02/23 1732  BP: (!) 157/91 (!) 113/52 120/78 139/74  Pulse: 98 (!) 48 74 71  Resp: 18 18 17 17   Temp: 98.5 F (36.9 C) 98.6 F (37 C) 98.2 F (36.8 C) 98.1 F (36.7 C)  TempSrc: Oral  Oral Oral  SpO2: 100% 92% 100%   Weight:      Height:        Intake/Output Summary (Last 24 hours) at 09/02/2023 1735 Last data filed at 09/02/2023 1320 Gross per 24 hour  Intake --  Output 1 ml  Net -1 ml   Filed Weights   09/01/23 1208  Weight: 105.7 kg    Examination:  General exam: Appears calm and comfortable  Respiratory system: Clear to auscultation.  No wheezes, no crackles, no rhonchi.  Fair air movement.  Speaking in full sentences.  Respiratory effort normal. Cardiovascular system: S1 & S2 heard, RRR. No JVD, murmurs, rubs, gallops or clicks. No pedal edema. Gastrointestinal system: Abdomen is nondistended, soft and nontender. No organomegaly or masses felt. Normal bowel sounds heard. Central nervous system: Alert and oriented. No focal neurological deficits. Extremities: Symmetric 5 x 5 power. Skin: No rashes, lesions or ulcers Psychiatry: Judgement and insight appear normal. Mood & affect appropriate.     Data Reviewed: I have personally reviewed following labs and imaging studies  CBC: Recent Labs  Lab 08/31/23 2310 09/02/23 0452  WBC 19.1* 11.1*  NEUTROABS 17.1*  --   HGB 14.7 12.7  HCT 48.3* 42.8  MCV 89.4 93.2  PLT 382 305    Basic Metabolic Panel: Recent Labs  Lab 08/31/23 2310 09/01/23 0227 09/02/23 0447 09/02/23 0452  NA 136 137  --  137  K 4.5 3.9  --  3.7  CL 94* 97*  --  100  CO2 23 24  --  24  GLUCOSE 293* 246*  --  209*  BUN 7 10  --  12  CREATININE 0.70 0.79  --  0.83  CALCIUM 10.2  9.0  --  8.8*  MG  --   --  1.8  --     GFR: Estimated Creatinine Clearance: 97.5 mL/min (by C-G formula based on SCr of 0.83 mg/dL).  Liver Function Tests: Recent Labs  Lab 08/31/23 2310  AST 32  ALT 22  ALKPHOS 74  BILITOT 0.6  PROT 8.6*  ALBUMIN 4.5    CBG: Recent Labs  Lab 09/01/23 1648 09/01/23 2056 09/02/23 0758 09/02/23 1155 09/02/23 1619  GLUCAP 184* 204* 202* 183* 207*     No results found for this or any previous visit (from the past 240 hours).       Radiology Studies: CT Angio Chest/Abd/Pel for Dissection W and/or Wo Contrast Result Date: 09/01/2023 CLINICAL DATA:  Sudden onset chest and abdominal and leg pain, initial encounter EXAM: CT ANGIOGRAPHY CHEST, ABDOMEN AND PELVIS TECHNIQUE: Non-contrast CT of the chest was initially obtained. Multidetector CT imaging through the chest, abdomen and pelvis was performed using the standard protocol during bolus administration of intravenous contrast. Multiplanar reconstructed images and MIPs were obtained and reviewed to evaluate the vascular anatomy. RADIATION DOSE REDUCTION: This exam was performed according to the departmental dose-optimization program which includes automated exposure control, adjustment of the mA and/or kV according to patient size and/or use of iterative reconstruction technique. CONTRAST:  OMNIPAQUE  IOHEXOL  350 MG/ML SOLN COMPARISON:  Chest x-ray from earlier in the same day. CT from 03/04/2021 FINDINGS: CTA CHEST FINDINGS Cardiovascular: Initial precontrast images show no aneurysmal dilatation of the aorta. Postcontrast images show no evidence of dissection. No cardiac enlargement is seen. The pulmonary artery as visualized is within normal limits without filling defect to suggest pulmonary embolism. Mediastinum/Nodes: Thoracic inlet is within normal limits. No hilar or mediastinal adenopathy is noted. The esophagus as visualized is within normal limits. Lungs/Pleura: Lungs are well aerated  bilaterally. No focal infiltrate or sizable effusion is seen. No parenchymal nodules are noted. Mild mosaic attenuation is noted consistent with air trapping. Musculoskeletal: Degenerative change of the thoracic spine is noted. No acute rib abnormality is seen. Review of the MIP images confirms the above findings. CTA ABDOMEN AND PELVIS FINDINGS VASCULAR Aorta: Normal caliber aorta without aneurysm, dissection, vasculitis or significant stenosis. Celiac: Patent without evidence of aneurysm, dissection, vasculitis or significant stenosis. SMA: Patent without evidence of aneurysm, dissection, vasculitis or significant stenosis. Renals: Both renal arteries are patent without evidence of aneurysm, dissection, vasculitis, fibromuscular dysplasia or significant stenosis. IMA: Patent without evidence of aneurysm, dissection, vasculitis or significant stenosis. Inflow: Iliacs show atherosclerotic calcification without aneurysmal dilatation or focal stenosis. Veins: No specific venous abnormality is noted. Normal variant left retroaortic renal vein is seen. Review of the MIP images confirms the above findings. NON-VASCULAR Hepatobiliary: Fatty infiltration of the liver is noted. The gallbladder is within normal limits. Pancreas: Unremarkable. No pancreatic ductal dilatation or  surrounding inflammatory changes. Spleen: Normal in size without focal abnormality. Adrenals/Urinary Tract: Adrenal glands are mildly thickened although no focal mass lesion is seen. The kidneys demonstrate a normal enhancement pattern bilaterally. No calculi or obstructive changes are noted. The bladder is partially distended. Stomach/Bowel: No obstructive or inflammatory changes of the colon are noted. The appendix is unremarkable. Small bowel and stomach are within normal limits. Lymphatic: No significant lymphadenopathy is noted. Reproductive: Uterus and bilateral adnexa are unremarkable. Other: Small fat containing umbilical hernia is noted. No  free fluid is seen. Musculoskeletal: Degenerative changes are noted at L4-5 with endplate sclerosis. No acute abnormality noted. Review of the MIP images confirms the above findings. IMPRESSION: CTA of the chest: No evidence of aortic abnormality or pulmonary emboli. No acute abnormality seen. CTA of the abdomen and pelvis: Atherosclerotic calcifications are seen although no acute arterial abnormality is noted. Fatty liver. No acute abnormality is noted to correspond with the given clinical history. Electronically Signed   By: Oneil Devonshire M.D.   On: 09/01/2023 01:00   DG Chest Portable 1 View Result Date: 08/31/2023 CLINICAL DATA:  History of stage IV metastatic bone cancer presenting with intermittent lower extremity pain. EXAM: PORTABLE CHEST 1 VIEW COMPARISON:  April 15, 2022 FINDINGS: The heart size and mediastinal contours are within normal limits. Both lungs are clear. The visualized skeletal structures are unremarkable. IMPRESSION: No active disease. Electronically Signed   By: Suzen Dials M.D.   On: 08/31/2023 23:45        Scheduled Meds:  ALPRAZolam   1 mg Oral TID   enoxaparin  (LOVENOX ) injection  55 mg Subcutaneous QHS   fluticasone  furoate-vilanterol  1 puff Inhalation Daily   furosemide   40 mg Oral BID   insulin  aspart  0-15 Units Subcutaneous TID WC   insulin  aspart  0-5 Units Subcutaneous QHS   insulin  glargine  12 Units Subcutaneous QHS   linaclotide   145 mcg Oral QAC breakfast   nicotine   14 mg Transdermal Daily   Continuous Infusions:  sodium chloride  125 mL/hr at 09/02/23 1320     LOS: 0 days    Time spent: 35 minutes    Toribio Hummer, MD [facility]   To contact the attending provider between 7A-7P or the covering provider during after hours 7P-7A, please log into the web site [email or web address] and access using universal [place] password for that web site. If you do not have the password, please call the hospital operator.  09/02/2023, 5:35 PM

## 2023-09-03 ENCOUNTER — Inpatient Hospital Stay (HOSPITAL_COMMUNITY)

## 2023-09-03 ENCOUNTER — Encounter (HOSPITAL_COMMUNITY): Payer: Self-pay | Admitting: Internal Medicine

## 2023-09-03 DIAGNOSIS — D72829 Elevated white blood cell count, unspecified: Secondary | ICD-10-CM | POA: Diagnosis not present

## 2023-09-03 DIAGNOSIS — R1110 Vomiting, unspecified: Secondary | ICD-10-CM | POA: Diagnosis not present

## 2023-09-03 DIAGNOSIS — F419 Anxiety disorder, unspecified: Secondary | ICD-10-CM | POA: Diagnosis not present

## 2023-09-03 DIAGNOSIS — E119 Type 2 diabetes mellitus without complications: Secondary | ICD-10-CM | POA: Diagnosis not present

## 2023-09-03 LAB — CBC WITH DIFFERENTIAL/PLATELET
Abs Immature Granulocytes: 0.02 K/uL (ref 0.00–0.07)
Basophils Absolute: 0 K/uL (ref 0.0–0.1)
Basophils Relative: 0 %
Eosinophils Absolute: 0.1 K/uL (ref 0.0–0.5)
Eosinophils Relative: 2 %
HCT: 41.4 % (ref 36.0–46.0)
Hemoglobin: 12.5 g/dL (ref 12.0–15.0)
Immature Granulocytes: 0 %
Lymphocytes Relative: 26 %
Lymphs Abs: 2.3 K/uL (ref 0.7–4.0)
MCH: 27.9 pg (ref 26.0–34.0)
MCHC: 30.2 g/dL (ref 30.0–36.0)
MCV: 92.4 fL (ref 80.0–100.0)
Monocytes Absolute: 0.7 K/uL (ref 0.1–1.0)
Monocytes Relative: 8 %
Neutro Abs: 5.7 K/uL (ref 1.7–7.7)
Neutrophils Relative %: 64 %
Platelets: 282 K/uL (ref 150–400)
RBC: 4.48 MIL/uL (ref 3.87–5.11)
RDW: 13.2 % (ref 11.5–15.5)
WBC: 8.9 K/uL (ref 4.0–10.5)
nRBC: 0 % (ref 0.0–0.2)

## 2023-09-03 LAB — BASIC METABOLIC PANEL WITH GFR
Anion gap: 12 (ref 5–15)
BUN: 7 mg/dL (ref 6–20)
CO2: 26 mmol/L (ref 22–32)
Calcium: 8.5 mg/dL — ABNORMAL LOW (ref 8.9–10.3)
Chloride: 102 mmol/L (ref 98–111)
Creatinine, Ser: 0.7 mg/dL (ref 0.44–1.00)
GFR, Estimated: >60 mL/min (ref >60)
Glucose, Bld: 149 mg/dL — ABNORMAL HIGH (ref 70–99)
Potassium: 3.6 mmol/L (ref 3.5–5.1)
Sodium: 140 mmol/L (ref 135–145)

## 2023-09-03 LAB — GLUCOSE, CAPILLARY
Glucose-Capillary: 181 mg/dL — ABNORMAL HIGH (ref 70–99)
Glucose-Capillary: 211 mg/dL — ABNORMAL HIGH (ref 70–99)
Glucose-Capillary: 105 mg/dL — ABNORMAL HIGH (ref 70–99)
Glucose-Capillary: 240 mg/dL — ABNORMAL HIGH (ref 70–99)

## 2023-09-03 LAB — MAGNESIUM: Magnesium: 1.7 mg/dL (ref 1.7–2.4)

## 2023-09-03 MED ORDER — HYDROMORPHONE HCL 1 MG/ML IJ SOLN
0.5000 mg | Freq: Once | INTRAMUSCULAR | Status: DC
  Filled 2023-09-03: qty 0.5

## 2023-09-03 MED ORDER — SENNOSIDES-DOCUSATE SODIUM 8.6-50 MG PO TABS
1.0000 | ORAL_TABLET | Freq: Two times a day (BID) | ORAL | Status: DC
  Administered 2023-09-03 – 2023-09-04 (×2): 1 via ORAL
  Filled 2023-09-03 (×2): qty 1

## 2023-09-03 MED ORDER — FUROSEMIDE 40 MG PO TABS
40.0000 mg | ORAL_TABLET | Freq: Two times a day (BID) | ORAL | Status: DC
  Administered 2023-09-04: 40 mg via ORAL
  Filled 2023-09-03: qty 1

## 2023-09-03 MED ORDER — ENOXAPARIN SODIUM 60 MG/0.6ML IJ SOSY
50.0000 mg | PREFILLED_SYRINGE | Freq: Every day | INTRAMUSCULAR | Status: DC
  Administered 2023-09-03: 50 mg via SUBCUTANEOUS
  Filled 2023-09-03: qty 0.6

## 2023-09-03 MED ORDER — BISACODYL 10 MG RE SUPP
10.0000 mg | Freq: Once | RECTAL | Status: DC
  Filled 2023-09-03: qty 1

## 2023-09-03 MED ORDER — PROCHLORPERAZINE EDISYLATE 10 MG/2ML IJ SOLN
10.0000 mg | Freq: Four times a day (QID) | INTRAMUSCULAR | Status: DC | PRN
  Administered 2023-09-03 – 2023-09-04 (×2): 10 mg via INTRAVENOUS
  Filled 2023-09-03 (×2): qty 2

## 2023-09-03 MED ORDER — BISACODYL 10 MG RE SUPP: 10.0000 mg | Freq: Every day | RECTAL | Status: DC | PRN

## 2023-09-03 MED ORDER — HYDROMORPHONE HCL 1 MG/ML IJ SOLN
0.5000 mg | INTRAMUSCULAR | Status: DC | PRN
  Administered 2023-09-03 – 2023-09-04 (×4): 1 mg via INTRAVENOUS
  Filled 2023-09-03 (×4): qty 1

## 2023-09-03 MED ORDER — PANTOPRAZOLE SODIUM 40 MG IV SOLR
40.0000 mg | Freq: Two times a day (BID) | INTRAVENOUS | Status: DC
  Administered 2023-09-03 – 2023-09-04 (×3): 40 mg via INTRAVENOUS
  Filled 2023-09-03 (×3): qty 10

## 2023-09-03 MED ORDER — SORBITOL 70 % SOLN
30.0000 mL | Status: AC
  Administered 2023-09-03 (×2): 30 mL via ORAL
  Filled 2023-09-03 (×2): qty 30

## 2023-09-03 MED ORDER — MAGNESIUM SULFATE 2 GM/50ML IV SOLN
2.0000 g | Freq: Once | INTRAVENOUS | Status: AC
  Administered 2023-09-03: 2 g via INTRAVENOUS
  Filled 2023-09-03: qty 50

## 2023-09-03 NOTE — Progress Notes (Signed)
 Rapid Response Event Note   Reason for Call : pt having severe abd pain   Initial Focused Assessment: Arrived to find pt lying bed crying and holding her abd.  Pt is A/O and able to f/c.  Rates her pain a 6/10 in her abd.  Pt last bm was Wednesday, denies flatulence.  Obese abd, bowel sounds in all quads are active.  VS in flowsheet.  Pulses intact. Lung sounds decreased.   Interventions: VS obtained, pt given pain medicine. [facility], NP made aware new orders received and initiated.    Plan of Care: Pt will remain in current location at this time.  Primary,RN will continue to monitor and report.     Event Summary:   MD Notified: yes Call Time: 0623 Arrival Time: 9372 End Time: 0700  Silena Wyss Lavern, RN

## 2023-09-03 NOTE — Progress Notes (Signed)
 PROGRESS NOTE    Andrea Montgomery  FMW:[id number] DOB: 1975-04-23 DOA: 08/31/2023 PCP: Hillman Bare, MD    Chief Complaint  Patient presents with   Dehydration    Brief Narrative:  Patient is a 48 year old female history of COPD on room air, insulin -dependent diabetes mellitus type 2, hypertension admitted to the hospital intractable nausea and vomiting.   Assessment & Plan:   Principal Problem:   Intractable vomiting Active Problems:   Leukocytosis   Diabetes mellitus type 2, insulin  dependent (HCC)   Cramps, muscle, general   Anxiety  #1 intractable nausea and vomiting -??  Etiology. -Patient states that with nausea and emesis early on this morning. - CT angiogram chest abdomen and pelvis with no acute abnormalities noted. - Lab work with no acute abnormalities noted. -Trial of full liquid diet today. -IV fluids, IV antiemetics. -Change IV Dilaudid  to every 4 hours as needed. -Supportive care.  2.  COPD -Stable.  3.  Diabetes mellitus type 2 -Hemoglobin A1c noted at 9.0. - CBG 181 this morning. - Continue Lantus  12 units daily.  - SSI.  4.  Anxiety - Continue home regimen Xanax .    5.  Cramps -Patient noted cramps to be chronic without clear etiology. - Continue home regimen of Flexeril  as needed.  6.  Leukocytosis -Likely reactive leukocytosis. - Patient with no signs of infection. -Resolved.    DVT prophylaxis: Lovenox  Code Status: Full Family Communication: Updated patient and fianc at bedside. Disposition: Home when clinically improved with no further nausea and emesis and tolerating oral intake hopefully in the next 24 to 48 hours.  Status is: Inpatient The patient will require care spanning > 2 midnights and should be moved to inpatient because: Severity of illness   Consultants:  None  Procedures:  CT angiogram chest abdomen and pelvis 09/01/2023 Chest x-ray 08/31/2022  Antimicrobials:  Anti-infectives (From admission, onward)     None         Subjective: Patient with complaints of nausea.  Stated had a bout of emesis early on.  Some complaints of upper abdominal discomfort/pain.  Tolerating full liquids.  Fianc at bedside.   Objective: Vitals:   09/02/23 1955 09/03/23 0624 09/03/23 0650 09/03/23 0855  BP: (!) 160/86 (!) 152/95 (!) 166/93 (!) 174/96  Pulse: 89 66 95 84  Resp: (P) 18  16   Temp: (!) (P) 97.5 F (36.4 C)  98.3 F (36.8 C)   TempSrc: (P) Oral  Oral   SpO2: 99%     Weight:      Height:        Intake/Output Summary (Last 24 hours) at 09/03/2023 1208 Last data filed at 09/03/2023 0942 Gross per 24 hour  Intake 2174.32 ml  Output 1 ml  Net 2173.32 ml   Filed Weights   09/01/23 1208  Weight: 105.7 kg    Examination:  General exam: NAD. Respiratory system: Lungs clear to auscultation bilaterally.  No wheezes, no crackles, no rhonchi.  Fair air movement.  Speaking in full sentences.   Cardiovascular system: Regular rate and rhythm no murmurs rubs or gallops.  No JVD.  No lower extremity edema.  Gastrointestinal system: Abdomen is nondistended, soft and some tenderness to palpation upper abdominal region.  Positive bowel sounds.  No rebound.  No guarding.  Central nervous system: Alert and oriented. No focal neurological deficits. Extremities: Symmetric 5 x 5 power. Skin: No rashes, lesions or ulcers Psychiatry: Judgement and insight appear normal. Mood & affect appropriate.  Data Reviewed: I have personally reviewed following labs and imaging studies  CBC: Recent Labs  Lab 08/31/23 2310 09/02/23 0452 09/03/23 0515  WBC 19.1* 11.1* 8.9  NEUTROABS 17.1*  --  5.7  HGB 14.7 12.7 12.5  HCT 48.3* 42.8 41.4  MCV 89.4 93.2 92.4  PLT 382 305 282    Basic Metabolic Panel: Recent Labs  Lab 08/31/23 2310 09/01/23 0227 09/02/23 0447 09/02/23 0452 09/03/23 0515  NA 136 137  --  137 140  K 4.5 3.9  --  3.7 3.6  CL 94* 97*  --  100 102  CO2 23 24  --  24 26  GLUCOSE  293* 246*  --  209* 149*  BUN 7 10  --  12 7  CREATININE 0.70 0.79  --  0.83 0.70  CALCIUM 10.2 9.0  --  8.8* 8.5*  MG  --   --  1.8  --  1.7    GFR: Estimated Creatinine Clearance: 101.1 mL/min (by C-G formula based on SCr of 0.7 mg/dL).  Liver Function Tests: Recent Labs  Lab 08/31/23 2310  AST 32  ALT 22  ALKPHOS 74  BILITOT 0.6  PROT 8.6*  ALBUMIN 4.5    CBG: Recent Labs  Lab 09/02/23 0758 09/02/23 1155 09/02/23 1619 09/02/23 2026 09/03/23 0806  GLUCAP 202* 183* 207* 128* 181*     No results found for this or any previous visit (from the past 240 hours).       Radiology Studies: DG Abd 1 View Result Date: 09/03/2023 EXAM: 1 VIEW XRAY OF THE ABDOMEN 09/03/2023 07:28:00 AM COMPARISON: CT 09/01/2023 CLINICAL HISTORY: Constipated; abdominal pain. Pt reported nausea and vomiting over the past 3-4 days. Last bowel movement was 3 days ago. FINDINGS: BOWEL: Nonobstructive bowel gas pattern. SOFT TISSUES: No opaque urinary calculi. BONES: Spondylotic changes in the lower lumbar spine. No acute osseous abnormality. IMPRESSION: 1. No acute findings. Electronically signed by: Dayne Hassell MD 09/03/2023 10:08 AM EDT RP Workstation: [id number]        Scheduled Meds:  ALPRAZolam   1 mg Oral TID   bisacodyl   10 mg Rectal Once   enoxaparin  (LOVENOX ) injection  50 mg Subcutaneous QHS   fluticasone  furoate-vilanterol  1 puff Inhalation Daily   [START ON 09/04/2023] furosemide   40 mg Oral BID    HYDROmorphone  (DILAUDID ) injection  0.5 mg Intravenous Once   insulin  aspart  0-15 Units Subcutaneous TID WC   insulin  aspart  0-5 Units Subcutaneous QHS   insulin  glargine  12 Units Subcutaneous QHS   linaclotide   145 mcg Oral QAC breakfast   nicotine   14 mg Transdermal Daily   Continuous Infusions:  sodium chloride  100 mL/hr at 09/03/23 0826     LOS: 1 day    Time spent: 35 minutes    Toribio Hummer, MD [facility]   To contact the attending provider  between 7A-7P or the covering provider during after hours 7P-7A, please log into the web site [email or web address] and access using universal [place] password for that web site. If you do not have the password, please call the hospital operator.  09/03/2023, 12:08 PM

## 2023-09-03 NOTE — Plan of Care (Signed)

## 2023-09-03 NOTE — Plan of Care (Signed)
   Problem: Education: Goal: Ability to describe self-care measures that may prevent or decrease complications (Diabetes Survival Skills Education) will improve Outcome: Progressing   Problem: Coping: Goal: Ability to adjust to condition or change in health will improve Outcome: Progressing   Problem: Fluid Volume: Goal: Ability to maintain a balanced intake and output will improve Outcome: Progressing

## 2023-09-03 NOTE — Progress Notes (Signed)
   09/03/23 1253  TOC Brief Assessment  Insurance and Status Reviewed  Patient has primary care physician Yes  Home environment has been reviewed Home w/ spouse  Prior level of function: independent  Prior/Current Home Services No current home services  Social Drivers of Health Review SDOH reviewed no interventions necessary  Readmission risk has been reviewed Yes  Transition of care needs no transition of care needs at this time

## 2023-09-04 DIAGNOSIS — R1110 Vomiting, unspecified: Secondary | ICD-10-CM | POA: Diagnosis not present

## 2023-09-04 DIAGNOSIS — D72829 Elevated white blood cell count, unspecified: Secondary | ICD-10-CM | POA: Diagnosis not present

## 2023-09-04 DIAGNOSIS — F419 Anxiety disorder, unspecified: Secondary | ICD-10-CM | POA: Diagnosis not present

## 2023-09-04 DIAGNOSIS — E119 Type 2 diabetes mellitus without complications: Secondary | ICD-10-CM | POA: Diagnosis not present

## 2023-09-04 LAB — BASIC METABOLIC PANEL WITH GFR
Anion gap: 12 (ref 5–15)
BUN: 7 mg/dL (ref 6–20)
CO2: 27 mmol/L (ref 22–32)
Calcium: 8.9 mg/dL (ref 8.9–10.3)
Chloride: 100 mmol/L (ref 98–111)
Creatinine, Ser: 0.66 mg/dL (ref 0.44–1.00)
GFR, Estimated: >60 mL/min (ref >60)
Glucose, Bld: 211 mg/dL — ABNORMAL HIGH (ref 70–99)
Potassium: 3.6 mmol/L (ref 3.5–5.1)
Sodium: 140 mmol/L (ref 135–145)

## 2023-09-04 LAB — CBC WITH DIFFERENTIAL/PLATELET
Abs Immature Granulocytes: 0.02 K/uL (ref 0.00–0.07)
Basophils Absolute: 0 K/uL (ref 0.0–0.1)
Basophils Relative: 0 %
Eosinophils Absolute: 0.1 K/uL (ref 0.0–0.5)
Eosinophils Relative: 1 %
HCT: 41 % (ref 36.0–46.0)
Hemoglobin: 12.1 g/dL (ref 12.0–15.0)
Immature Granulocytes: 0 %
Lymphocytes Relative: 19 %
Lymphs Abs: 1.5 K/uL (ref 0.7–4.0)
MCH: 27 pg (ref 26.0–34.0)
MCHC: 29.5 g/dL — ABNORMAL LOW (ref 30.0–36.0)
MCV: 91.5 fL (ref 80.0–100.0)
Monocytes Absolute: 0.6 K/uL (ref 0.1–1.0)
Monocytes Relative: 8 %
Neutro Abs: 5.5 K/uL (ref 1.7–7.7)
Neutrophils Relative %: 72 %
Platelets: 268 K/uL (ref 150–400)
RBC: 4.48 MIL/uL (ref 3.87–5.11)
RDW: 13 % (ref 11.5–15.5)
WBC: 7.8 K/uL (ref 4.0–10.5)
nRBC: 0 % (ref 0.0–0.2)

## 2023-09-04 LAB — GLUCOSE, CAPILLARY
Glucose-Capillary: 198 mg/dL — ABNORMAL HIGH (ref 70–99)
Glucose-Capillary: 204 mg/dL — ABNORMAL HIGH (ref 70–99)

## 2023-09-04 LAB — MAGNESIUM: Magnesium: 1.9 mg/dL (ref 1.7–2.4)

## 2023-09-04 MED ORDER — PANTOPRAZOLE SODIUM 40 MG PO TBEC: 40.0000 mg | DELAYED_RELEASE_TABLET | Freq: Every day | ORAL | 1 refills | Status: AC

## 2023-09-04 MED ORDER — ACETAMINOPHEN 325 MG PO TABS
650.0000 mg | ORAL_TABLET | Freq: Four times a day (QID) | ORAL | Status: AC | PRN
Start: 2023-09-04 — End: ?

## 2023-09-04 MED ORDER — PROCHLORPERAZINE MALEATE 10 MG PO TABS: 10.0000 mg | ORAL_TABLET | Freq: Four times a day (QID) | ORAL | 0 refills | Status: AC | PRN

## 2023-09-04 MED ORDER — SENNOSIDES-DOCUSATE SODIUM 8.6-50 MG PO TABS: 1.0000 | ORAL_TABLET | Freq: Two times a day (BID) | ORAL | Status: DC

## 2023-09-04 MED ORDER — NICOTINE 14 MG/24HR TD PT24: 14.0000 mg | MEDICATED_PATCH | Freq: Every day | TRANSDERMAL | 0 refills | Status: AC

## 2023-09-04 NOTE — Discharge Summary (Signed)
 Physician Discharge Summary  ROSELIE CIRIGLIANO FMW:[id number] DOB: [birth year] DOA: 08/31/2023  PCP: Hillman Bare, MD  Admit date: 08/31/2023 Discharge date: 09/04/2023  Time spent: 60 minutes  Recommendations for Outpatient Follow-up:  Follow-up. Hillman Bare, MD in 1 to 2 weeks.  On follow-up patient will need a basic metabolic profile, magnesium  level done to follow-up on electrolytes and renal function.   Discharge Diagnoses:  Principal Problem:   Intractable vomiting Active Problems:   Leukocytosis   Diabetes mellitus type 2, insulin  dependent (HCC)   Cramps, muscle, general   Anxiety   Discharge Condition: Stable and improved.  Diet recommendation: Carb modified diet  Filed Weights   09/01/23 1208  Weight: 105.7 kg    History of present illness:  HPI per Dr. Zella Junella JIM PHILEMON is a 48 y.o. female with medical history significant for COPD on room air, insulin -dependent type 2 diabetes, hypertension being admitted to the hospital with intractable nausea and vomiting.  She states that she does get intermittent spasms all over her body, she takes Flexeril  for this as needed.  She is also followed by rheumatology due to joint pain, rashes and RNP positive antibodies.  She states that yesterday she started having pretty severe cramps all over her body, this was causing her nausea and vomiting which was intractable.  She does feel a little better this morning.  Extensive workup in the ER including lab work and imaging does not show anything acute.  She has also mention to ER staff that she has stage IV metastatic bone cancer, and she got recent chemotherapy.  She tells me that she sees a Dr. Vicci on [address] but that he is not part of the [facility] health system.   Hospital Course:  #1 intractable nausea and vomiting -??  Etiology. -Patient admitted underwent intractable nausea and vomiting as noted above in HPI.   - CT angiogram chest abdomen and pelvis with  no acute abnormalities noted. - Lab work with no acute abnormalities noted. - Patient initially placed on bowel rest, placed on IV fluids, IV antiemetics, IV pain medication, IV PPI.   - Patient subsequently started on clear liquids and diet advanced to a soft diet which she tolerated.   - Patient had no further nausea or vomiting on day of discharge.   - Patient will be discharged home in stable and improved condition with outpatient follow-up with PCP.     2.  COPD -Stable.   3.  Diabetes mellitus type 2 -Hemoglobin A1c noted at 9.0. - Patient maintained on home regimen Lantus  12 units daily as well as sliding scale insulin .   - outpatient follow-up with PCP.   4.  Anxiety - Patient maintained on home regimen Xanax .     5.  Chronic cramps -Patient noted cramps to be chronic without clear etiology. - Patient maintained on home regimen of Flexeril  as needed.   6.  Leukocytosis -Likely reactive leukocytosis. - Patient with no signs of infection. -Resolved during the hospitalization.    Procedures: CT angiogram chest abdomen and pelvis 09/01/2023 Chest x-ray 08/31/2022  Consultations: None  Discharge Exam: Vitals:   09/04/23 0907 09/04/23 1200  BP: (!) 180/90 (!) 153/62  Pulse:  69  Resp:  20  Temp:  98 F (36.7 C)  SpO2:  100%    General: NAD Cardiovascular: RRR no murmurs rubs or gallops.  No JVD.  No pitting lower extremity edema. Respiratory: Clear to auscultation bilaterally.  No wheezes, no crackles, no rhonchi.  Fair air movement.  Speaking in full sentences.  Discharge Instructions   Discharge Instructions     Diet Carb Modified   Complete by: As directed    Increase activity slowly   Complete by: As directed       Allergies as of 09/04/2023       Reactions   Penicillins Anaphylaxis   Flovent Hfa [fluticasone ]    migraine   Gabapentin Swelling   Lisinopril    Facial swelling        Medication List     TAKE these medications     acetaminophen  325 MG tablet Commonly known as: TYLENOL  Take 2 tablets (650 mg total) by mouth every 6 (six) hours as needed for mild pain (pain score 1-3) or fever (or Fever >/= 101).   albuterol  108 (90 Base) MCG/ACT inhaler Commonly known as: VENTOLIN  HFA Inhale 2 puffs into the lungs every 6 (six) hours as needed for wheezing or shortness of breath.   ALPRAZolam  1 MG tablet Commonly known as: XANAX  Take 1 mg by mouth 3 (three) times daily.   cyclobenzaprine  10 MG tablet Commonly known as: FLEXERIL  Take 0.5-1 tablets (5-10 mg total) by mouth 3 (three) times daily as needed for muscle spasms   Dexcom G7 Sensor Misc as directed.   ergocalciferol  1.25 MG (50000 UT) capsule Commonly known as: VITAMIN D2 Take 1 capsule (50,000 Units total) by mouth once a week.   Fluticasone -Salmeterol 250-50 MCG/DOSE Aepb Commonly known as: ADVAIR Inhale 1 puff into the lungs daily as needed. For shortness of breath   furosemide  40 MG tablet Commonly known as: LASIX  Take 40 mg by mouth 2 (two) times daily.   insulin  aspart protamine- aspart (70-30) 100 UNIT/ML injection Commonly known as: NOVOLOG  MIX 70/30 Inject 15 Units into the skin 2 (two) times daily with a meal. Take 15 units in the morning 10 units at lunch and bedtime  4 time daily If blood sugar is over 400   insulin  glargine 100 UNIT/ML injection Commonly known as: LANTUS  Inject 12 Units into the skin at bedtime.   ipratropium-albuterol  0.5-2.5 (3) MG/3ML Soln Commonly known as: DUONEB Take 3 mLs by nebulization every 4 (four) hours as needed (shortness of breath).   Linzess  145 MCG Caps capsule Generic drug: linaclotide  TAKE 1 CAPSULE BY MOUTH DAILY BEFORE BREAKFAST.   losartan  100 MG tablet Commonly known as: COZAAR  Take 100 mg by mouth daily.   metFORMIN  1000 MG tablet Commonly known as: GLUCOPHAGE  Take 1,000 mg by mouth daily with breakfast.   naloxone  4 MG/0.1ML Liqd nasal spray kit Commonly known as:  NARCAN  Place 1 spray into the nose once.   nicotine  14 mg/24hr patch Commonly known as: NICODERM CQ  - dosed in mg/24 hours Place 1 patch (14 mg total) onto the skin daily. Start taking on: September 05, 2023   oxyCODONE -acetaminophen  10-325 MG tablet Commonly known as: PERCOCET Take 1 tablet by mouth 4 (four) times daily as needed for pain   pantoprazole  40 MG tablet Commonly known as: Protonix  Take 1 tablet (40 mg total) by mouth daily.   prochlorperazine  10 MG tablet Commonly known as: COMPAZINE  Take 1 tablet (10 mg total) by mouth every 6 (six) hours as needed for nausea or vomiting.   senna-docusate 8.6-50 MG tablet Commonly known as: Senokot-S Take 1 tablet by mouth 2 (two) times daily.       Allergies  Allergen Reactions   Penicillins Anaphylaxis   Flovent Hfa [Fluticasone ]     migraine  Gabapentin Swelling   Lisinopril     Facial swelling    Follow-up Information     Hillman Bare, MD. Schedule an appointment as soon as possible for a visit in 1 week(s).   Specialty: Pulmonary Disease Why: Follow-up in 1 to 2 weeks Contact information: [address] [place] [address] [phone]                  The results of significant diagnostics from this hospitalization (including imaging, microbiology, ancillary and laboratory) are listed below for reference.    Significant Diagnostic Studies: DG Abd 1 View Result Date: 09/03/2023 EXAM: 1 VIEW XRAY OF THE ABDOMEN 09/03/2023 07:28:00 AM COMPARISON: CT 09/01/2023 CLINICAL HISTORY: Constipated; abdominal pain. Pt reported nausea and vomiting over the past 3-4 days. Last bowel movement was 3 days ago. FINDINGS: BOWEL: Nonobstructive bowel gas pattern. SOFT TISSUES: No opaque urinary calculi. BONES: Spondylotic changes in the lower lumbar spine. No acute osseous abnormality. IMPRESSION: 1. No acute findings. Electronically signed by: Dayne Hassell MD 09/03/2023 10:08 AM EDT RP Workstation:  [id number]   CT Angio Chest/Abd/Pel for Dissection W and/or Wo Contrast Result Date: 09/01/2023 CLINICAL DATA:  Sudden onset chest and abdominal and leg pain, initial encounter EXAM: CT ANGIOGRAPHY CHEST, ABDOMEN AND PELVIS TECHNIQUE: Non-contrast CT of the chest was initially obtained. Multidetector CT imaging through the chest, abdomen and pelvis was performed using the standard protocol during bolus administration of intravenous contrast. Multiplanar reconstructed images and MIPs were obtained and reviewed to evaluate the vascular anatomy. RADIATION DOSE REDUCTION: This exam was performed according to the departmental dose-optimization program which includes automated exposure control, adjustment of the mA and/or kV according to patient size and/or use of iterative reconstruction technique. CONTRAST:  OMNIPAQUE  IOHEXOL  350 MG/ML SOLN COMPARISON:  Chest x-ray from earlier in the same day. CT from 03/04/2021 FINDINGS: CTA CHEST FINDINGS Cardiovascular: Initial precontrast images show no aneurysmal dilatation of the aorta. Postcontrast images show no evidence of dissection. No cardiac enlargement is seen. The pulmonary artery as visualized is within normal limits without filling defect to suggest pulmonary embolism. Mediastinum/Nodes: Thoracic inlet is within normal limits. No hilar or mediastinal adenopathy is noted. The esophagus as visualized is within normal limits. Lungs/Pleura: Lungs are well aerated bilaterally. No focal infiltrate or sizable effusion is seen. No parenchymal nodules are noted. Mild mosaic attenuation is noted consistent with air trapping. Musculoskeletal: Degenerative change of the thoracic spine is noted. No acute rib abnormality is seen. Review of the MIP images confirms the above findings. CTA ABDOMEN AND PELVIS FINDINGS VASCULAR Aorta: Normal caliber aorta without aneurysm, dissection, vasculitis or significant stenosis. Celiac: Patent without evidence of aneurysm, dissection,  vasculitis or significant stenosis. SMA: Patent without evidence of aneurysm, dissection, vasculitis or significant stenosis. Renals: Both renal arteries are patent without evidence of aneurysm, dissection, vasculitis, fibromuscular dysplasia or significant stenosis. IMA: Patent without evidence of aneurysm, dissection, vasculitis or significant stenosis. Inflow: Iliacs show atherosclerotic calcification without aneurysmal dilatation or focal stenosis. Veins: No specific venous abnormality is noted. Normal variant left retroaortic renal vein is seen. Review of the MIP images confirms the above findings. NON-VASCULAR Hepatobiliary: Fatty infiltration of the liver is noted. The gallbladder is within normal limits. Pancreas: Unremarkable. No pancreatic ductal dilatation or surrounding inflammatory changes. Spleen: Normal in size without focal abnormality. Adrenals/Urinary Tract: Adrenal glands are mildly thickened although no focal mass lesion is seen. The kidneys demonstrate a normal enhancement pattern bilaterally. No calculi or obstructive changes are noted. The  bladder is partially distended. Stomach/Bowel: No obstructive or inflammatory changes of the colon are noted. The appendix is unremarkable. Small bowel and stomach are within normal limits. Lymphatic: No significant lymphadenopathy is noted. Reproductive: Uterus and bilateral adnexa are unremarkable. Other: Small fat containing umbilical hernia is noted. No free fluid is seen. Musculoskeletal: Degenerative changes are noted at L4-5 with endplate sclerosis. No acute abnormality noted. Review of the MIP images confirms the above findings. IMPRESSION: CTA of the chest: No evidence of aortic abnormality or pulmonary emboli. No acute abnormality seen. CTA of the abdomen and pelvis: Atherosclerotic calcifications are seen although no acute arterial abnormality is noted. Fatty liver. No acute abnormality is noted to correspond with the given clinical history.  Electronically Signed   By: Oneil Devonshire M.D.   On: 09/01/2023 01:00   DG Chest Portable 1 View Result Date: 08/31/2023 CLINICAL DATA:  History of stage IV metastatic bone cancer presenting with intermittent lower extremity pain. EXAM: PORTABLE CHEST 1 VIEW COMPARISON:  April 15, 2022 FINDINGS: The heart size and mediastinal contours are within normal limits. Both lungs are clear. The visualized skeletal structures are unremarkable. IMPRESSION: No active disease. Electronically Signed   By: Suzen Dials M.D.   On: 08/31/2023 23:45   CT Maxillofacial Wo Contrast Result Date: 08/08/2023 CLINICAL DATA:  Blunt facial trauma. EXAM: CT MAXILLOFACIAL WITHOUT CONTRAST TECHNIQUE: Multidetector CT imaging of the maxillofacial structures was performed. Multiplanar CT image reconstructions were also generated. RADIATION DOSE REDUCTION: This exam was performed according to the departmental dose-optimization program which includes automated exposure control, adjustment of the mA and/or kV according to patient size and/or use of iterative reconstruction technique. COMPARISON:  Head CT 04/20/2022 FINDINGS: Osseous: There is a depressed right medial orbital wall fracture. No entrapment of intraorbital contents. No additional facial bone fractures visualized. Orbits: Globes are normal and symmetric. Optic nerves and extraocular muscles are normal. Evidence of patient's right medial wall orbital fracture with small amount of air over the inferior and medial orbit. Moderate air over the anterior inferior orbital soft tissues. Sinuses: Mild opacification of the right ethmoid air cells likely hemorrhagic debris from patient's adjacent fracture. Remaining sinuses are clear. Soft tissues: Soft tissue swelling and air over the right inferior overall soft tissues. Limited intracranial: No significant or unexpected finding. IMPRESSION: Depressed right medial orbital wall fracture. No entrapment of intraorbital contents. Associated  soft tissue swelling and air over the right inferior orbital soft tissues. Electronically Signed   By: Toribio Agreste M.D.   On: 08/08/2023 14:21   DG Knee Complete 4 Views Right Result Date: 08/08/2023 CLINICAL DATA:  knee pain. EXAM: RIGHT KNEE - COMPLETE 4+ VIEW COMPARISON:  None Available. FINDINGS: No acute fracture or dislocation. No aggressive osseous lesion. The knee joint appears within normal limits. No significant arthritis. No knee effusion or focal soft tissue swelling. No radiopaque foreign bodies. IMPRESSION: No acute osseous abnormality of the right knee joint. Electronically Signed   By: Ree Molt M.D.   On: 08/08/2023 13:41    Microbiology: No results found for this or any previous visit (from the past 240 hours).   Labs: Basic Metabolic Panel: Recent Labs  Lab 08/31/23 2310 09/01/23 0227 09/02/23 0447 09/02/23 0452 09/03/23 0515 09/04/23 0726  NA 136 137  --  137 140 140  K 4.5 3.9  --  3.7 3.6 3.6  CL 94* 97*  --  100 102 100  CO2 23 24  --  24 26 27   GLUCOSE 293*  246*  --  209* 149* 211*  BUN 7 10  --  12 7 7   CREATININE 0.70 0.79  --  0.83 0.70 0.66  CALCIUM 10.2 9.0  --  8.8* 8.5* 8.9  MG  --   --  1.8  --  1.7 1.9   Liver Function Tests: Recent Labs  Lab 08/31/23 2310  AST 32  ALT 22  ALKPHOS 74  BILITOT 0.6  PROT 8.6*  ALBUMIN 4.5   Recent Labs  Lab 09/01/23 0958  LIPASE 11   No results for input(s): AMMONIA  in the last 168 hours. CBC: Recent Labs  Lab 08/31/23 2310 09/02/23 0452 09/03/23 0515 09/04/23 0726  WBC 19.1* 11.1* 8.9 7.8  NEUTROABS 17.1*  --  5.7 5.5  HGB 14.7 12.7 12.5 12.1  HCT 48.3* 42.8 41.4 41.0  MCV 89.4 93.2 92.4 91.5  PLT 382 305 282 268   Cardiac Enzymes: Recent Labs  Lab 08/31/23 2310  CKTOTAL 318*   BNP: BNP (last 3 results) No results for input(s): BNP in the last 8760 hours.  ProBNP (last 3 results) No results for input(s): PROBNP in the last 8760 hours.  CBG: Recent Labs  Lab  09/03/23 1218 09/03/23 1655 09/03/23 2103 09/04/23 0739 09/04/23 1124  GLUCAP 211* 105* 240* 198* 204*       Signed:  Toribio Hummer MD.  [facility] 09/04/2023, 2:32 PM

## 2023-09-06 ENCOUNTER — Other Ambulatory Visit

## 2023-09-13 ENCOUNTER — Ambulatory Visit: Payer: Self-pay | Admitting: Nurse Practitioner

## 2023-09-13 ENCOUNTER — Ambulatory Visit (INDEPENDENT_AMBULATORY_CARE_PROVIDER_SITE_OTHER)

## 2023-09-13 DIAGNOSIS — N946 Dysmenorrhea, unspecified: Secondary | ICD-10-CM | POA: Diagnosis not present

## 2023-09-13 DIAGNOSIS — N920 Excessive and frequent menstruation with regular cycle: Secondary | ICD-10-CM | POA: Diagnosis not present

## 2023-09-19 ENCOUNTER — Ambulatory Visit: Admitting: Obstetrics and Gynecology

## 2023-09-21 NOTE — Progress Notes (Signed)
 48 y.o. G2P0202 female with AUB, hx of endometrial ablation (2016), CD x2 BTL, significant COPD, HTN, T2DM, tobacco use here for surgical consultation for hysterectomy. Single, long term partner. Disabled.  Patient's last menstrual period was 08/24/2023 (exact date).   Per 08/31/23 T. Prentiss, NP note: Menses are regular, heavy bleeding 6-7 day with large clots, using largest depends and changing every 1-2 hours, severe cramping, totally bleeding time 8-9 days. H/O ablation, helped for a while. Interested in definitive surgery.   Ablation failed in 2019. She reports severe pain with menses. Using ibuprofen  with minimal relief. Went to ED on  08/31/23 2/2 pain. Discussed hysterectomy with prior OBGYN but her referred to [facility], however she has transportation limits. Not using nicotine  patches, working on decreasing smoking. Smoking 2 cigs/day. Very anxious, took Xanaz this morning. +trich 02/16/2023. Her and husband were treated. Appt 10/19/23 with endocrine for DM management.  09/04/23 Hgb 12.1 09/01/23 A1c 9.0   09/13/23 TVUS: 10x6x4cm Anteverted uterus, normal size and shape, noted definitive myometrial masses.  Endometrium 11.3 mm, avascular.  No masses seen.     Left ovary not seen.  Right ovary shows simple follicle.     No adnexal masses, no free fluid.  Last PAP:    Component Value Date/Time   DIAGPAP (A) 02/16/2023 1621    - Atypical squamous cells of undetermined significance (ASC-US )   DIAGPAP  01/27/2021 1354    - Negative for intraepithelial lesion or malignancy (NILM)   DIAGPAP  04/29/2017 0000    NEGATIVE FOR INTRAEPITHELIAL LESIONS OR MALIGNANCY.   DIAGPAP TRICHOMONAS VAGINALIS PRESENT. 04/29/2017 0000   HPVHIGH Negative 02/16/2023 1621   HPVHIGH Negative 01/27/2021 1354   ADEQPAP  02/16/2023 1621    Satisfactory for evaluation; transformation zone component PRESENT.   ADEQPAP  01/27/2021 1354    Satisfactory for evaluation; transformation zone component PRESENT.    ADEQPAP  04/29/2017 0000    Satisfactory for evaluation  endocervical/transformation zone component PRESENT.   Birth control: BTL Sexually active: yes    GYN HISTORY: CD x2+BTL Endometrial ablation  OB History  Gravida Para Term Preterm AB Living  2 2 0 2  2  SAB IAB Ectopic Multiple Live Births      2    # Outcome Date GA Lbr Len/2nd Weight Sex Type Anes PTL Lv  2 Preterm      CS-Unspec   LIV  1 Preterm      CS-Unspec   LIV    Past Medical History:  Diagnosis Date   Anxiety    Asthma    COPD (chronic obstructive pulmonary disease) (HCC)    Degenerative disc disease, lumbar    Depression    Diabetes mellitus    Emphysema of lung (HCC)    Hypertension    Iron deficiency anemia 03/12/2014   Leg weakness    Low back pain    Pneumonia    PONV (postoperative nausea and vomiting)    Sleep apnea     Past Surgical History:  Procedure Laterality Date   ALVEOLOPLASTY Bilateral 10/30/2021   Procedure: ALVEOLOPLASTY;  Surgeon: Sheryle Hamilton, DMD;  Location: [facility] OR;  Service: Oral Surgery;  Laterality: Bilateral;   ceserean section times 2     DILITATION & CURRETTAGE/HYSTROSCOPY WITH HYDROTHERMAL ABLATION N/A 10/25/2014   Procedure: DILATATION & CURETTAGE/HYSTEROSCOPY WITH HYDROTHERMAL ABLATION;  Surgeon: Carlin DELENA Centers, MD;  Location: [facility] ORS;  Service: Gynecology;  Laterality: N/A;   TOOTH EXTRACTION N/A 10/30/2021   Procedure: DENTAL RESTORATION/EXTRACTIONS  of 3,5,6,8,9,11,12,14,18,19,20,22,23,25,26,27,28,30,31;  Surgeon: Sheryle Hamilton, DMD;  Location: [facility];  Service: Oral Surgery;  Laterality: N/A;   TUBAL LIGATION     tumor removed from leg      Current Outpatient Medications on File Prior to Visit  Medication Sig Dispense Refill   acetaminophen  (TYLENOL ) 325 MG tablet Take 2 tablets (650 mg total) by mouth every 6 (six) hours as needed for mild pain (pain score 1-3) or fever (or Fever >/= 101).     albuterol  (PROVENTIL  HFA;VENTOLIN  HFA) 108 (90 BASE) MCG/ACT inhaler  Inhale 2 puffs into the lungs every 6 (six) hours as needed for wheezing or shortness of breath.      ALPRAZolam  (XANAX ) 1 MG tablet Take 1 mg by mouth 3 (three) times daily.     Continuous Glucose Sensor (DEXCOM G7 SENSOR) MISC as directed.     cyclobenzaprine  (FLEXERIL ) 10 MG tablet Take 0.5-1 tablets (5-10 mg total) by mouth 3 (three) times daily as needed for muscle spasms 90 tablet 0   ergocalciferol  (VITAMIN D2) 1.25 MG (50000 UT) capsule Take 1 capsule (50,000 Units total) by mouth once a week. 12 capsule 1   Fluticasone -Salmeterol (ADVAIR) 250-50 MCG/DOSE AEPB Inhale 1 puff into the lungs daily as needed. For shortness of breath     furosemide  (LASIX ) 40 MG tablet Take 40 mg by mouth 2 (two) times daily.     insulin  aspart protamine-insulin  aspart (NOVOLOG  70/30) (70-30) 100 UNIT/ML injection Inject 15 Units into the skin 2 (two) times daily with a meal. Take 15 units in the morning 10 units at lunch and bedtime  4 time daily If blood sugar is over 400     ipratropium-albuterol  (DUONEB) 0.5-2.5 (3) MG/3ML SOLN Take 3 mLs by nebulization every 4 (four) hours as needed (shortness of breath).     LANTUS  SOLOSTAR 100 UNIT/ML Solostar Pen Inject into the skin.     LINZESS  145 MCG CAPS capsule TAKE 1 CAPSULE BY MOUTH DAILY BEFORE BREAKFAST. 90 capsule 1   losartan  (COZAAR ) 100 MG tablet Take 100 mg by mouth daily.     metFORMIN  (GLUCOPHAGE ) 1000 MG tablet Take 1,000 mg by mouth daily with breakfast.     naloxone  (NARCAN ) nasal spray 4 mg/0.1 mL Place 1 spray into the nose once.     nicotine  (NICODERM CQ  - DOSED IN MG/24 HOURS) 14 mg/24hr patch Place 1 patch (14 mg total) onto the skin daily. 28 patch 0   oxyCODONE -acetaminophen  (PERCOCET) 10-325 MG tablet Take 1 tablet by mouth 4 (four) times daily as needed for pain 120 tablet 0   pantoprazole  (PROTONIX ) 40 MG tablet Take 1 tablet (40 mg total) by mouth daily. 30 tablet 1   prochlorperazine  (COMPAZINE ) 10 MG tablet Take 1 tablet (10 mg total)  by mouth every 6 (six) hours as needed for nausea or vomiting. 20 tablet 0   senna-docusate (SENOKOT-S) 8.6-50 MG tablet Take 1 tablet by mouth 2 (two) times daily.     No current facility-administered medications on file prior to visit.    Allergies  Allergen Reactions   Penicillins Anaphylaxis   Flovent Hfa [Fluticasone ]     migraine   Gabapentin Swelling   Lisinopril     Facial swelling      PE Today's Vitals   09/22/23 0858  BP: 118/80  Pulse: 64  Temp: 98.3 F (36.8 C)  TempSrc: Oral  Weight: 232 lb (105.2 kg)  Height: 5' 3.5 (1.613 m)   Body mass index is 40.45 kg/m.  Physical Exam Vitals reviewed.  Constitutional:      General: She is not in acute distress.    Appearance: Normal appearance.  HENT:     Head: Normocephalic and atraumatic.     Nose: Nose normal.  Eyes:     Extraocular Movements: Extraocular movements intact.     Conjunctiva/sclera: Conjunctivae normal.  Pulmonary:     Effort: Pulmonary effort is normal.  Genitourinary:    Comments: Declined pelvic exam today Musculoskeletal:        General: Normal range of motion.     Cervical back: Normal range of motion.  Neurological:     General: No focal deficit present.     Mental Status: She is alert.  Psychiatric:        Mood and Affect: Mood normal.        Behavior: Behavior normal.      Assessment and Plan:        Menorrhagia with regular cycle Severe dysmenorrhea Failed endometrial ablation Hx of 2 prior CD +BTL Reviewed TVUS, Hgb Recommend trich TOC followed by EMB Start progestin therapy while awaiting work-up. NSAIDS for pain control Discussed interventional procedures like COLOMBIA and hysterectomy Will also need surgical clearance addressing multiple comorbidities -     Norethindrone  Acetate; Take 2 tablets (10 mg total) by mouth daily.  Dispense: 180 tablet; Refill: 0       -     EMB  Tobacco abuse Recommend cessation  Trichomoniasis Need TOC prior to EMB, RTO for  TOC  T2DM Preop goal <8.5%   Vera LULLA Pa, MD

## 2023-09-22 ENCOUNTER — Encounter: Payer: Self-pay | Admitting: Obstetrics and Gynecology

## 2023-09-22 ENCOUNTER — Ambulatory Visit (INDEPENDENT_AMBULATORY_CARE_PROVIDER_SITE_OTHER): Admitting: Obstetrics and Gynecology

## 2023-09-22 VITALS — BP 118/80 | HR 64 | Temp 98.3°F | Ht 63.5 in | Wt 232.0 lb

## 2023-09-22 DIAGNOSIS — A599 Trichomoniasis, unspecified: Secondary | ICD-10-CM | POA: Diagnosis not present

## 2023-09-22 DIAGNOSIS — N946 Dysmenorrhea, unspecified: Secondary | ICD-10-CM | POA: Diagnosis not present

## 2023-09-22 DIAGNOSIS — Z720 Tobacco use: Secondary | ICD-10-CM | POA: Diagnosis not present

## 2023-09-22 DIAGNOSIS — N920 Excessive and frequent menstruation with regular cycle: Secondary | ICD-10-CM | POA: Diagnosis not present

## 2023-09-22 MED ORDER — NORETHINDRONE ACETATE 5 MG PO TABS: 10.0000 mg | ORAL_TABLET | Freq: Every day | ORAL | 0 refills | Status: DC

## 2023-09-23 ENCOUNTER — Telehealth: Payer: Self-pay

## 2023-09-23 NOTE — Telephone Encounter (Signed)
 Patient called & left message on triage voicemail stating that Dr Dallie wrote her a rx at her appointment yesterday & the pharmacy is not filling it. I called the pharmacy & they stated that the prescription is in process & will be ready today. She stated they were running a little behind today. I called [facility] & left her a voicemail letting her know. DPR was signed.

## 2023-09-26 NOTE — Telephone Encounter (Signed)
 Patient called requesting status on Aygestin . Patient states Rx was not available when she went to [facility] this morning.   Call placed to [facility] with patient on line. Spoke with [place]. Was advised RX was filled as previously advised, not readily available, had to be shipped directly to [facility] location for patient to pick up. [place] advised that patient was notified that RX would be in store this morning at 1030 to be picked up. Patient states for future she wants RX [facility] on [facility]. [facility]  asked if patient wanted current RX to be transferred to [facility], patient declined. Patient ended call.   Routing FYI.   Encounter closed.

## 2023-09-27 ENCOUNTER — Telehealth: Payer: Self-pay

## 2023-09-27 MED ORDER — MEGESTROL ACETATE 40 MG PO TABS: 40.0000 mg | ORAL_TABLET | Freq: Two times a day (BID) | ORAL | 1 refills | Status: DC

## 2023-09-27 MED ORDER — ONDANSETRON 4 MG PO TBDP: 4.0000 mg | ORAL_TABLET | Freq: Three times a day (TID) | ORAL | 0 refills | Status: AC | PRN

## 2023-09-27 NOTE — Telephone Encounter (Signed)
 Patient called back and stated that the pharmacist & pcp told her that the megace  would affect her diabetes. Please advise.

## 2023-09-27 NOTE — Telephone Encounter (Signed)
 Let's switch her to megace , and I will send in a rx for zofran  for N/V. She can take benadryl for the itchy rash. However, if she is unable to keep down fluids, has shortness of breath or difficulty breathing, or develops hives with her rash she should go the emergency department for evaluation. Stop aygestin .

## 2023-09-27 NOTE — Telephone Encounter (Signed)
 Patient notified & aygestin  was added to her allergy list.

## 2023-09-27 NOTE — Telephone Encounter (Signed)
 Patient states she thinks she is having an allergic reaction to medication. She started aygestin  yesterday & since she took it, she has been having nausea, vomiting, stomach pain & itching on her back. She states she has not used any new detergents, soaps or lotions. She says the aygestin  is the only thing new that she has taken. Patient took a pill at 4:30 pm yesterday & has not taken one today yet. Patient is asking what else you can send in for her to take instead of the aygestin . Patient is aware to not take another pill until I talk to Dr Dallie. Patient is aware Dr Dallie is out of the office today. I will route this to her. If no response then I will send to a covering provider.

## 2023-09-28 NOTE — Telephone Encounter (Signed)
 Spoke with patient, advised per Dr. Dallie. Patient reports menses started today. Will start Megace . Patient verbalizes understanding and is agreeable. Patient aware to call if any questions/concerns. OV scheduled for 10/06/23.   Routing to provider for final review. Patient is agreeable to disposition. Will close encounter.

## 2023-09-30 ENCOUNTER — Telehealth: Payer: Self-pay | Admitting: *Deleted

## 2023-09-30 NOTE — Telephone Encounter (Signed)
 Patient left message on triage line at 1550. States she has started megace , bleeding has stopped. Blood sugars are consistently above 250, states PCP is closed today.   Spoke with patient. Started Megace  3 days ago. Bleeding has stopped. Tried to reach PCP today, they are closed.   I advised per Dr. Dallie previous recommendations,   she can see increased sugars while taking this medication. However, it is still safe to take. She should inform her PCP if her sugars are consistently above 200.   Patient is asking how her insulin  needs to be adjusted?  Patient states Dr. Dallie advised her to take megace , PCP said not to. Advised patient she will need to contact her PCP for further instruction regarding insulin .  Advised patient Dr. Dallie is out of the office, I have sent Dr. Dallie a message, our office will f/u if any additional recommendations. Patient states she will contact PCP.   Provider notified via text.  Routing to Dr. Dallie.

## 2023-09-30 NOTE — Telephone Encounter (Signed)
 Call placed to Dr. Dallie, left message advising message has been routed in [facility] to review.

## 2023-10-03 NOTE — Telephone Encounter (Signed)
 Returned call to patient Baseline glucose: 170-200s. Takes novolog  with meals: 15u/12u/20u 12u lantus  qHS  Glucose has been 200-250s over the week Recommend increase lantus  to16 unit at night while using megace  Return call to office if glucose remains >250  Nervous about EMB, discussed hysteroscopy as an alternative Maintain appt 10/2 for TOC

## 2023-10-06 ENCOUNTER — Encounter: Payer: Self-pay | Admitting: Obstetrics and Gynecology

## 2023-10-06 ENCOUNTER — Ambulatory Visit (INDEPENDENT_AMBULATORY_CARE_PROVIDER_SITE_OTHER): Admitting: Obstetrics and Gynecology

## 2023-10-06 VITALS — BP 156/84 | HR 88 | Temp 98.1°F | Wt 232.0 lb

## 2023-10-06 DIAGNOSIS — N920 Excessive and frequent menstruation with regular cycle: Secondary | ICD-10-CM

## 2023-10-06 DIAGNOSIS — A599 Trichomoniasis, unspecified: Secondary | ICD-10-CM | POA: Diagnosis not present

## 2023-10-06 LAB — WET PREP FOR TRICH, YEAST, CLUE

## 2023-10-06 MED ORDER — MEGESTROL ACETATE 40 MG PO TABS: 40.0000 mg | ORAL_TABLET | Freq: Every day | ORAL | 3 refills | Status: AC

## 2023-10-06 NOTE — Progress Notes (Signed)
 48 y.o. G2P0202 female with AUB, hx of endometrial ablation (2016), CD x2 BTL, significant COPD, HTN, T2DM, tobacco use here for TOC- trich. Single, long term partner. Disabled.  Patient's last menstrual period was 09/29/2023 (exact date).   Per 08/31/23 T. Prentiss, NP note: Menses are regular, heavy bleeding 6-7 day with large clots, using largest depends and changing every 1-2 hours, severe cramping, totally bleeding time 8-9 days. H/O ablation, helped for a while. Interested in definitive surgery.   Ablation failed in 2019. She reports severe pain with menses. Using ibuprofen  with minimal relief. Went to ED on  08/31/23 2/2 pain. Discussed hysterectomy with prior OBGYN but her referred to [facility], however she has transportation limits. Not using nicotine  patches, working on decreasing smoking. Smoking 2 cigs/day. Very anxious, took Xanax  this morning. +trich 02/16/2023. Her and husband were treated. Appt 10/19/23 with endocrine for DM management. I've increased insulin  since stating patient in megace  with poorly controlled DM: Novolog  with meals: 15u/12u/20u 12u lantus  at bedtime >16u lantus  at bedtime. However sugars are 356 on CGM in office today. Patient asymptomatic.  Notes vaginal itching today. Bleeding stopped with megace , mild cramping. Happy with these results. Would like to continue medications instead of surgery at this time.  09/04/23 Hgb 12.1 09/01/23 A1c 9.0   09/13/23 TVUS: 10x6x4cm Anteverted uterus, normal size and shape, noted definitive myometrial masses.  Endometrium 11.3 mm, avascular.  No masses seen.     Left ovary not seen.  Right ovary shows simple follicle.     No adnexal masses, no free fluid.  Last PAP:    Component Value Date/Time   DIAGPAP (A) 02/16/2023 1621    - Atypical squamous cells of undetermined significance (ASC-US )   DIAGPAP  01/27/2021 1354    - Negative for intraepithelial lesion or malignancy (NILM)   DIAGPAP  04/29/2017 0000    NEGATIVE  FOR INTRAEPITHELIAL LESIONS OR MALIGNANCY.   DIAGPAP TRICHOMONAS VAGINALIS PRESENT. 04/29/2017 0000   HPVHIGH Negative 02/16/2023 1621   HPVHIGH Negative 01/27/2021 1354   ADEQPAP  02/16/2023 1621    Satisfactory for evaluation; transformation zone component PRESENT.   ADEQPAP  01/27/2021 1354    Satisfactory for evaluation; transformation zone component PRESENT.   ADEQPAP  04/29/2017 0000    Satisfactory for evaluation  endocervical/transformation zone component PRESENT.   Birth control: BTL Sexually active: yes    GYN HISTORY: CD x2+BTL Endometrial ablation  OB History  Gravida Para Term Preterm AB Living  2 2 0 2  2  SAB IAB Ectopic Multiple Live Births      2    # Outcome Date GA Lbr Len/2nd Weight Sex Type Anes PTL Lv  2 Preterm      CS-Unspec   LIV  1 Preterm      CS-Unspec   LIV    Past Medical History:  Diagnosis Date   Anxiety    Asthma    COPD (chronic obstructive pulmonary disease) (HCC)    Degenerative disc disease, lumbar    Depression    Diabetes mellitus    Emphysema of lung (HCC)    Hypertension    Iron deficiency anemia 03/12/2014   Leg weakness    Low back pain    Pneumonia    PONV (postoperative nausea and vomiting)    Sleep apnea    STD (sexually transmitted disease)     Past Surgical History:  Procedure Laterality Date   ALVEOLOPLASTY Bilateral 10/30/2021   Procedure: ALVEOLOPLASTY;  Surgeon: Sheryle Hamilton,  DMD;  Location: [facility] OR;  Service: Oral Surgery;  Laterality: Bilateral;   ceserean section times 2     DILITATION & CURRETTAGE/HYSTROSCOPY WITH HYDROTHERMAL ABLATION N/A 10/25/2014   Procedure: DILATATION & CURETTAGE/HYSTEROSCOPY WITH HYDROTHERMAL ABLATION;  Surgeon: Carlin DELENA Centers, MD;  Location: [facility] ORS;  Service: Gynecology;  Laterality: N/A;   TOOTH EXTRACTION N/A 10/30/2021   Procedure: DENTAL RESTORATION/EXTRACTIONS of 3,5,6,8,9,11,12,14,18,19,20,22,23,25,26,27,28,30,31;  Surgeon: Sheryle Hamilton, DMD;  Location: [facility] OR;  Service:  Oral Surgery;  Laterality: N/A;   TUBAL LIGATION     tumor removed from leg      Current Outpatient Medications on File Prior to Visit  Medication Sig Dispense Refill   acetaminophen  (TYLENOL ) 325 MG tablet Take 2 tablets (650 mg total) by mouth every 6 (six) hours as needed for mild pain (pain score 1-3) or fever (or Fever >/= 101).     albuterol  (PROVENTIL  HFA;VENTOLIN  HFA) 108 (90 BASE) MCG/ACT inhaler Inhale 2 puffs into the lungs every 6 (six) hours as needed for wheezing or shortness of breath.      ALPRAZolam  (XANAX ) 1 MG tablet Take 1 mg by mouth 3 (three) times daily.     Continuous Glucose Sensor (DEXCOM G7 SENSOR) MISC as directed.     cyclobenzaprine  (FLEXERIL ) 10 MG tablet Take 0.5-1 tablets (5-10 mg total) by mouth 3 (three) times daily as needed for muscle spasms 90 tablet 0   ergocalciferol  (VITAMIN D2) 1.25 MG (50000 UT) capsule Take 1 capsule (50,000 Units total) by mouth once a week. 12 capsule 1   Fluticasone -Salmeterol (ADVAIR) 250-50 MCG/DOSE AEPB Inhale 1 puff into the lungs daily as needed. For shortness of breath     furosemide  (LASIX ) 40 MG tablet Take 40 mg by mouth 2 (two) times daily.     insulin  aspart protamine-insulin  aspart (NOVOLOG  70/30) (70-30) 100 UNIT/ML injection Inject 15 Units into the skin 2 (two) times daily with a meal. Take 15 units in the morning 10 units at lunch and bedtime  4 time daily If blood sugar is over 400     ipratropium-albuterol  (DUONEB) 0.5-2.5 (3) MG/3ML SOLN Take 3 mLs by nebulization every 4 (four) hours as needed (shortness of breath).     LANTUS  SOLOSTAR 100 UNIT/ML Solostar Pen Inject into the skin.     LINZESS  145 MCG CAPS capsule TAKE 1 CAPSULE BY MOUTH DAILY BEFORE BREAKFAST. 90 capsule 1   losartan  (COZAAR ) 100 MG tablet Take 100 mg by mouth daily.     megestrol  (MEGACE ) 40 MG tablet Take 1 tablet (40 mg total) by mouth 2 (two) times daily. 60 tablet 1   metFORMIN  (GLUCOPHAGE ) 1000 MG tablet Take 1,000 mg by mouth daily with  breakfast.     naloxone  (NARCAN ) nasal spray 4 mg/0.1 mL Place 1 spray into the nose once.     nicotine  (NICODERM CQ  - DOSED IN MG/24 HOURS) 14 mg/24hr patch Place 1 patch (14 mg total) onto the skin daily. 28 patch 0   ondansetron  (ZOFRAN -ODT) 4 MG disintegrating tablet Take 1 tablet (4 mg total) by mouth every 8 (eight) hours as needed for nausea or vomiting. 30 tablet 0   oxyCODONE -acetaminophen  (PERCOCET) 10-325 MG tablet Take 1 tablet by mouth 4 (four) times daily as needed for pain 120 tablet 0   pantoprazole  (PROTONIX ) 40 MG tablet Take 1 tablet (40 mg total) by mouth daily. 30 tablet 1   prochlorperazine  (COMPAZINE ) 10 MG tablet Take 1 tablet (10 mg total) by mouth every 6 (six) hours as needed  for nausea or vomiting. 20 tablet 0   senna-docusate (SENOKOT-S) 8.6-50 MG tablet Take 1 tablet by mouth 2 (two) times daily.     No current facility-administered medications on file prior to visit.    Allergies  Allergen Reactions   Penicillins Anaphylaxis   Aspirin     Other Reaction(s): Unknown   Aygestin  [Norethindrone ] Itching and Nausea And Vomiting    Itching, stomach pain, nausea & vomiting   Flovent Hfa [Fluticasone ]     migraine   Gabapentin Swelling   Lisinopril     Facial swelling      PE Today's Vitals   10/06/23 0911  BP: (!) 156/84  Pulse: 88  Temp: 98.1 F (36.7 C)  TempSrc: Oral  Weight: 232 lb (105.2 kg)   Body mass index is 40.45 kg/m.  Physical Exam Vitals reviewed. Exam conducted with a chaperone present.  Constitutional:      General: She is not in acute distress.    Appearance: Normal appearance.  HENT:     Head: Normocephalic and atraumatic.     Nose: Nose normal.  Eyes:     Extraocular Movements: Extraocular movements intact.     Conjunctiva/sclera: Conjunctivae normal.  Pulmonary:     Effort: Pulmonary effort is normal.  Genitourinary:    General: Normal vulva.     Exam position: Lithotomy position.     Vagina: Normal. No vaginal  discharge.     Cervix: Normal. No cervical motion tenderness, discharge or lesion.     Uterus: Normal. Enlarged and tender.      Adnexa: Right adnexa normal and left adnexa normal.  Musculoskeletal:        General: Normal range of motion.     Cervical back: Normal range of motion.  Neurological:     General: No focal deficit present.     Mental Status: She is alert.  Psychiatric:        Mood and Affect: Mood normal.        Behavior: Behavior normal.      Assessment and Plan:        Menorrhagia with regular cycle Severe dysmenorrhea Failed endometrial ablation Hx of 2 prior CD +BTL Reviewed TVUS, Hgb Recommend trich TOC followed by EMB (declined in office EMB) Start progestin therapy, had itchy rash with aygestin  and started on megace  with good cycle control. NSAIDS for pain control Wants to continue medical management however glucose poorly controlled on megace , recommend decreasing to megace  40mg  1 PO tab per day. Recommend taking additional 10u of novolag at home, followed by normal lunch dosing. If glucose remains >250 after 1 hr, to f/u with PCP Recommend increasing lantus  to 20u at bedtime tonight and then to 24u the following night if no episodes of hypoglycemia. F/u in 3 months  Trichomoniasis TOC neg today  Orders Placed This Encounter  Procedures   WET PREP FOR TRICH, YEAST, CLUE    Vera LULLA Pa, MD

## 2023-10-07 ENCOUNTER — Ambulatory Visit: Payer: Self-pay | Admitting: Obstetrics and Gynecology

## 2023-10-19 ENCOUNTER — Ambulatory Visit: Admitting: Skilled Nursing Facility1

## 2023-10-24 ENCOUNTER — Encounter: Attending: Pulmonary Disease | Admitting: Skilled Nursing Facility1

## 2023-10-24 ENCOUNTER — Encounter: Payer: Self-pay | Admitting: Skilled Nursing Facility1

## 2023-10-24 VITALS — Wt 231.8 lb

## 2023-10-24 DIAGNOSIS — Z794 Long term (current) use of insulin: Secondary | ICD-10-CM | POA: Diagnosis present

## 2023-10-24 DIAGNOSIS — E119 Type 2 diabetes mellitus without complications: Secondary | ICD-10-CM | POA: Insufficient documentation

## 2023-10-24 NOTE — Progress Notes (Signed)
 A1C 9.0  DM medications: Insulin  Metformin    Current blood sugar reading 258 arrow up current blood sugar.    Pt states she has been taking insulin  since she was 48 years old. Pt states she likes rice, potatoes, and noodles. Pt states she usually starts eating at 11:30 waking around 6:30am. Pt states she does not eat pork, beef, bread. Pt states she has slipped discs keeping her from being active as she liked.  Pt states she lives with her fiance and children whom all have DM as well.  Pt states she does not sleep well at night due to her daughters sleep habits.  Pt states she got her smoking down to 2 cigarettes per day. Pt states she does generally well its just this medicine she is on to stop menstruating raising her blood sugars.  Pt states she is going to try a 1500 calorie diet that her daughter is going to try for weight loss.   11:30: half the bag popcorn 7pm: chicken on salad + eggs + cheese + creamy ceasar + liquid IV 9:00 pm popcorn  Beverages: water, 100 grams sugary beverage minimum daily   Novolog : 3 times a day 13 units breakfast, 20 units lunch, dinner 25 units  Lantus : 24 units   Goals: Cut out the juice and other sugary beverages  Have more non starchy vegetables compared to the pasta and meats  Eat breakfast  Talk with your doctor about a referral to a talk therapist   Diabetes Self-Management Education  Visit Type: First/Initial  Appt. Start Time: 2:00 Appt. End Time: 2:45  10/24/2023  Andrea Montgomery, identified by name and date of birth, is a 48 y.o. female with a diagnosis of Diabetes: Type 2.   ASSESSMENT  Weight 231 lb 12.8 oz (105.1 kg), last menstrual period 09/29/2023. Body mass index is 40.42 kg/m.   Diabetes Self-Management Education - 10/24/23 1448       Visit Information   Visit Type First/Initial      Initial Visit   Diabetes Type Type 2    Are you currently following a meal plan? No    Are you taking your medications as  prescribed? Yes      Health Coping   How would you rate your overall health? Good      Psychosocial Assessment   Patient Belief/Attitude about Diabetes Defeat/Burnout    What is the hardest part about your diabetes right now, causing you the most concern, or is the most worrisome to you about your diabetes?   Making healty food and beverage choices    Self-care barriers Debilitated state due to current medical condition    Self-management support None    Patient Concerns Nutrition/Meal planning;Healthy Lifestyle;Weight Control    Special Needs None    Preferred Learning Style Visual;Hands on    Learning Readiness Not Ready    How often do you need to have someone help you when you read instructions, pamphlets, or other written materials from your doctor or pharmacy? 1 - Never      Pre-Education Assessment   Patient understands the diabetes disease and treatment process. Needs Instruction    Patient understands incorporating nutritional management into lifestyle. Needs Instruction    Patient undertands incorporating physical activity into lifestyle. Needs Instruction    Patient understands using medications safely. Needs Instruction    Patient understands monitoring blood glucose, interpreting and using results Needs Instruction    Patient understands prevention, detection, and treatment of acute complications.  Needs Instruction    Patient understands prevention, detection, and treatment of chronic complications. Needs Instruction    Patient understands how to develop strategies to address psychosocial issues. Needs Instruction    Patient understands how to develop strategies to promote health/change behavior. Needs Instruction      Complications   Last HgB A1C per patient/outside source 9 %    How often do you check your blood sugar? 3-4 times/day    Fasting Blood glucose range (mg/dL) >799    Postprandial Blood glucose range (mg/dL) >799    Number of hypoglycemic episodes per month 0       Activity / Exercise   Activity / Exercise Type ADL's    How many days per week do you exercise? 0    How many minutes per day do you exercise? 0    Total minutes per week of exercise 0      Patient Education   Previous Diabetes Education No    Disease Pathophysiology Factors that contribute to the development of diabetes    Healthy Eating Role of diet in the treatment of diabetes and the relationship between the three main macronutrients and blood glucose level;Food label reading, portion sizes and measuring food.;Plate Method;Meal timing in regards to the patients' current diabetes medication.;Reviewed blood glucose goals for pre and post meals and how to evaluate the patients' food intake on their blood glucose level.    Monitoring Taught/evaluated CGM (comment);Daily foot exams;Interpreting lab values - A1C, lipid, urine microalbumina.;Taught/discussed recording of test results and interpretation of SMBG.    Acute complications Taught prevention, symptoms, and  treatment of hypoglycemia - the 15 rule.;Discussed and identified patients' prevention, symptoms, and treatment of hyperglycemia.    Chronic complications Dental care;Retinopathy and reason for yearly dilated eye exams;Nephropathy, what it is, prevention of, the use of ACE, ARB's and early detection of through urine microalbumia.;Lipid levels, blood glucose control and heart disease;Assessed and discussed foot care and prevention of foot problems    Diabetes Stress and Support Identified and addressed patients feelings and concerns about diabetes;Worked with patient to identify barriers to care and solutions;Role of stress on diabetes    Lifestyle and Health Coping Review risk of smoking and offered smoking cessation      Individualized Goals (developed by patient)   Nutrition Follow meal plan discussed;General guidelines for healthy choices and portions discussed    Medications take my medication as prescribed    Monitoring   Consistenly use CGM    Problem Solving Eating Pattern      Post-Education Assessment   Patient understands the diabetes disease and treatment process. Needs Instruction    Patient understands incorporating nutritional management into lifestyle. Needs Instruction    Patient undertands incorporating physical activity into lifestyle. Needs Instruction    Patient understands using medications safely. Needs Instruction    Patient understands monitoring blood glucose, interpreting and using results Needs Instruction    Patient understands prevention, detection, and treatment of acute complications. Needs Instruction    Patient understands prevention, detection, and treatment of chronic complications. Needs Instruction    Patient understands how to develop strategies to address psychosocial issues. Needs Instruction    Patient understands how to develop strategies to promote health/change behavior. Needs Instruction      Outcomes   Expected Outcomes Demonstrated limited interest in learning.  Expect minimal changes    Future DMSE 4-6 wks    Program Status Completed          Individualized Plan  for Diabetes Self-Management Training:   Learning Objective:  Patient will have a greater understanding of diabetes self-management. Patient education plan is to attend individual and/or group sessions per assessed needs and concerns.    Expected Outcomes:  Demonstrated limited interest in learning.  Expect minimal changes  Education material provided: ADA - How to Thrive: A Guide for Your Journey with Diabetes, Food label handouts, A1C conversion sheet, Meal plan card, and My Plate  If problems or questions, patient to contact team via:  Phone and Email  Future DSME appointment: 4-6 wks

## 2023-12-05 ENCOUNTER — Encounter: Admitting: Skilled Nursing Facility1

## 2024-01-03 ENCOUNTER — Other Ambulatory Visit (HOSPITAL_COMMUNITY): Payer: Self-pay

## 2024-01-06 ENCOUNTER — Encounter: Payer: Self-pay | Admitting: Obstetrics and Gynecology

## 2024-01-06 ENCOUNTER — Ambulatory Visit (INDEPENDENT_AMBULATORY_CARE_PROVIDER_SITE_OTHER): Admitting: Obstetrics and Gynecology

## 2024-01-06 VITALS — BP 110/62 | HR 78 | Temp 97.7°F | Wt 226.0 lb

## 2024-01-06 DIAGNOSIS — N946 Dysmenorrhea, unspecified: Secondary | ICD-10-CM | POA: Diagnosis not present

## 2024-01-06 DIAGNOSIS — N920 Excessive and frequent menstruation with regular cycle: Secondary | ICD-10-CM

## 2024-01-06 DIAGNOSIS — K5900 Constipation, unspecified: Secondary | ICD-10-CM

## 2024-01-06 NOTE — Assessment & Plan Note (Signed)
 Failed endometrial ablation, Hx of 2 prior CD +BTL Normal TVUS, Hgb Start progestin therapy, had itchy rash with aygestin  and started on megace  with good cycle control. NSAIDS for pain control Wants to continue medical management however glucose poorly controlled on megace , recommend switching to provera . Patient declines Also discussed [place] for interventional management of HVB, info provided to patient. F/u in 6 months

## 2024-01-06 NOTE — Progress Notes (Signed)
 "  49 y.o. G2P0202 female with AUB, hx of endometrial ablation (2016), CD x2 BTL, significant COPD, HTN, T2DM, tobacco use here for med f/u. Single, long term partner. Disabled.  Patient's last menstrual period was 01/02/2024 (approximate). Period Duration (Days): 7 Period Pattern: Regular Menstrual Flow: Moderate Menstrual Control: Maxi pad Dysmenorrhea: (!) Moderate  Per 08/31/23 T. Prentiss, NP note: Menses are regular, heavy bleeding 6-7 day with large clots, using largest depends and changing every 1-2 hours, severe cramping, totally bleeding time 8-9 days. H/O ablation, helped for a while. Interested in definitive surgery.   09/22/23: Ablation failed in 2019. She reports severe pain with menses. Using ibuprofen  with minimal relief. Went to ED on  08/31/23 2/2 pain. Discussed hysterectomy with prior OBGYN but her referred to [facility], however she has transportation limits. Not using nicotine  patches, working on decreasing smoking. Smoking 2 cigs/day. Very anxious, took Xanax  this morning. +trich 02/16/2023. Her and husband were treated. Neg TOC 10/06/23.  10/06/23: She was started on Aygestin  but reported an allergic reaction (N/V, abdominal pain, itching) and was switched to Megace . However her blood glucose has been poorly controlled since starting Megace . Decreased to 40 mg nightly. Seeing PCP q47month for insulin  adjustments. Patient declined EMB last appointment.  Today, she reports no bleeding for 2 months, then a light period that just started. She has left sided, sharp pain but declined a pelvic exam. +constipation, only 1 BM over past week Wants to continue medications instead of surgery since megace  is working Seeing PCP q2 months for insulin  management, planning to start GLP 1  09/04/23 Hgb 12.1 09/01/23 A1c 9.0   09/13/23 TVUS: 10x6x4cm Anteverted uterus, normal size and shape, noted definitive myometrial masses.   Endometrium 11.3 mm, avascular.  No masses seen.     Left  ovary not seen.  Right ovary shows simple follicle.    No adnexal masses, no free fluid.  Last PAP:    Component Value Date/Time   DIAGPAP (A) 02/16/2023 1621    - Atypical squamous cells of undetermined significance (ASC-US )   DIAGPAP  01/27/2021 1354    - Negative for intraepithelial lesion or malignancy (NILM)   DIAGPAP  04/29/2017 0000    NEGATIVE FOR INTRAEPITHELIAL LESIONS OR MALIGNANCY.   DIAGPAP TRICHOMONAS VAGINALIS PRESENT. 04/29/2017 0000   HPVHIGH Negative 02/16/2023 1621   HPVHIGH Negative 01/27/2021 1354   ADEQPAP  02/16/2023 1621    Satisfactory for evaluation; transformation zone component PRESENT.   ADEQPAP  01/27/2021 1354    Satisfactory for evaluation; transformation zone component PRESENT.   ADEQPAP  04/29/2017 0000    Satisfactory for evaluation  endocervical/transformation zone component PRESENT.   Birth control: BTL Sexually active: yes    GYN HISTORY: CD x2+BTL Endometrial ablation  OB History  Gravida Para Term Preterm AB Living  2 2 0 2  2  SAB IAB Ectopic Multiple Live Births      2    # Outcome Date GA Lbr Len/2nd Weight Sex Type Anes PTL Lv  2 Preterm      CS-Unspec   LIV  1 Preterm      CS-Unspec   LIV    Past Medical History:  Diagnosis Date   Anxiety    Asthma    COPD (chronic obstructive pulmonary disease) (HCC)    Degenerative disc disease, lumbar    Depression    Diabetes mellitus    Emphysema of lung (HCC)    Hypertension    Iron deficiency anemia 03/12/2014  Leg weakness    Low back pain    Pneumonia    PONV (postoperative nausea and vomiting)    Sleep apnea    STD (sexually transmitted disease)     Past Surgical History:  Procedure Laterality Date   ALVEOLOPLASTY Bilateral 10/30/2021   Procedure: ALVEOLOPLASTY;  Surgeon: Sheryle Hamilton, DMD;  Location: [facility] OR;  Service: Oral Surgery;  Laterality: Bilateral;   ceserean section times 2     DILITATION & CURRETTAGE/HYSTROSCOPY WITH HYDROTHERMAL ABLATION N/A 10/25/2014    Procedure: DILATATION & CURETTAGE/HYSTEROSCOPY WITH HYDROTHERMAL ABLATION;  Surgeon: Carlin DELENA Centers, MD;  Location: [facility] ORS;  Service: Gynecology;  Laterality: N/A;   TOOTH EXTRACTION N/A 10/30/2021   Procedure: DENTAL RESTORATION/EXTRACTIONS of 3,5,6,8,9,11,12,14,18,19,20,22,23,25,26,27,28,30,31;  Surgeon: Sheryle Hamilton, DMD;  Location: [facility] OR;  Service: Oral Surgery;  Laterality: N/A;   TUBAL LIGATION     tumor removed from leg      Current Outpatient Medications on File Prior to Visit  Medication Sig Dispense Refill   acetaminophen  (TYLENOL ) 325 MG tablet Take 2 tablets (650 mg total) by mouth every 6 (six) hours as needed for mild pain (pain score 1-3) or fever (or Fever >/= 101).     albuterol  (PROVENTIL  HFA;VENTOLIN  HFA) 108 (90 BASE) MCG/ACT inhaler Inhale 2 puffs into the lungs every 6 (six) hours as needed for wheezing or shortness of breath.      ALPRAZolam  (XANAX ) 1 MG tablet Take 1 mg by mouth 3 (three) times daily.     Continuous Glucose Sensor (DEXCOM G7 SENSOR) MISC as directed.     cyclobenzaprine  (FLEXERIL ) 10 MG tablet Take 0.5-1 tablets (5-10 mg total) by mouth 3 (three) times daily as needed for muscle spasms 90 tablet 0   ergocalciferol  (VITAMIN D2) 1.25 MG (50000 UT) capsule Take 1 capsule (50,000 Units total) by mouth once a week. 12 capsule 1   Fluticasone -Salmeterol (ADVAIR) 250-50 MCG/DOSE AEPB Inhale 1 puff into the lungs daily as needed. For shortness of breath     furosemide  (LASIX ) 40 MG tablet Take 40 mg by mouth 2 (two) times daily.     insulin  aspart protamine-insulin  aspart (NOVOLOG  70/30) (70-30) 100 UNIT/ML injection Inject 15 Units into the skin 2 (two) times daily with a meal. Take 15 units in the morning 10 units at lunch and bedtime  4 time daily If blood sugar is over 400     ipratropium-albuterol  (DUONEB) 0.5-2.5 (3) MG/3ML SOLN Take 3 mLs by nebulization every 4 (four) hours as needed (shortness of breath).     LANTUS  SOLOSTAR 100 UNIT/ML Solostar  Pen Inject into the skin.     LINZESS  145 MCG CAPS capsule TAKE 1 CAPSULE BY MOUTH DAILY BEFORE BREAKFAST. 90 capsule 1   losartan  (COZAAR ) 100 MG tablet Take 100 mg by mouth daily.     megestrol  (MEGACE ) 40 MG tablet Take 1 tablet (40 mg total) by mouth daily. 90 tablet 3   metFORMIN  (GLUCOPHAGE ) 1000 MG tablet Take 1,000 mg by mouth daily with breakfast.     nicotine  (NICODERM CQ  - DOSED IN MG/24 HOURS) 14 mg/24hr patch Place 1 patch (14 mg total) onto the skin daily. 28 patch 0   ondansetron  (ZOFRAN -ODT) 4 MG disintegrating tablet Take 1 tablet (4 mg total) by mouth every 8 (eight) hours as needed for nausea or vomiting. 30 tablet 0   oxyCODONE -acetaminophen  (PERCOCET) 10-325 MG tablet Take 1 tablet by mouth 4 (four) times daily as needed for pain 120 tablet 0   pantoprazole  (PROTONIX ) 40  MG tablet Take 1 tablet (40 mg total) by mouth daily. 30 tablet 1   prochlorperazine  (COMPAZINE ) 10 MG tablet Take 1 tablet (10 mg total) by mouth every 6 (six) hours as needed for nausea or vomiting. 20 tablet 0   No current facility-administered medications on file prior to visit.    Allergies  Allergen Reactions   Penicillins Anaphylaxis   Aspirin     Other Reaction(s): Unknown   Aygestin  [Norethindrone ] Itching and Nausea And Vomiting    Itching, stomach pain, nausea & vomiting   Flovent Hfa [Fluticasone ]     migraine   Gabapentin Swelling   Lisinopril     Facial swelling      PE Today's Vitals   01/06/24 0859  BP: 110/62  Pulse: 78  Temp: 97.7 F (36.5 C)  TempSrc: Oral  Weight: 226 lb (102.5 kg)   Body mass index is 39.41 kg/m.  Physical Exam Vitals reviewed.  Constitutional:      General: She is not in acute distress.    Appearance: Normal appearance.  HENT:     Head: Normocephalic and atraumatic.     Nose: Nose normal.  Eyes:     Extraocular Movements: Extraocular movements intact.     Conjunctiva/sclera: Conjunctivae normal.  Pulmonary:     Effort: Pulmonary effort  is normal.  Musculoskeletal:        General: Normal range of motion.     Cervical back: Normal range of motion.  Neurological:     General: No focal deficit present.     Mental Status: She is alert.  Psychiatric:        Mood and Affect: Mood normal.        Behavior: Behavior normal.      Assessment and Plan:        Menorrhagia with regular cycle Assessment & Plan: Failed endometrial ablation, Hx of 2 prior CD +BTL Normal TVUS, Hgb Start progestin therapy, had itchy rash with aygestin  and started on megace  with good cycle control. NSAIDS for pain control Wants to continue medical management however glucose poorly controlled on megace , recommend switching to provera . Patient declines Also discussed UAE for interventional management of HVB, info provided to patient. Continue megace  40mg  every day, f/u with PCP for insuline management F/u in 6 months  Severe dysmenorrhea Continue NSAIDS PRN  Constipation, unspecified constipation type Recommend increasing hydration, fiber intake, and use of stool softeners.   25 minutes  total time was spent for this patient encounter, including preparation, face-to-face counseling with the patient, coordination of care, and documentation of the encounter.  Vera LULLA Pa, MD  "

## 2024-01-19 ENCOUNTER — Telehealth: Payer: Self-pay | Admitting: *Deleted

## 2024-01-19 NOTE — Telephone Encounter (Signed)
 Patient left message on triage line stating she was unable to fill Rx for Megace . Patient is requesting Rx be addressed prior to weekend.   Per review of EPIC, Rx for Megace  40 mg tab sent to [facility] on 10/06/23, #90/3RF  Call placed to [facility], spoke with Paige. Was advised there is an insurance problem, transferred to the pharmacy to speak directly. Was on hold for 35 min, call ended. Will try again in the morning.

## 2024-01-20 NOTE — Telephone Encounter (Signed)
 Spoke with Hargis at [facility].  Was advised Rx is available, has to be filled as 30 day supply. Rx in stock.   Spoke with patient, advised as seen above. Patient reports since she has reduced to taking Megace  daily vs BID bleeding and cramps has increased. Patient reports changing a full pad q3hrs. Taking ibuprofen  800 mg tid, using heating pad and percocet prescribed by pain management. Denies s/s anemia. Patient declines Provera  as discussed during 1/2 OV, states she is not interested in UAE at this time. Patient states symptoms were better managed taking Megace  twice daily. Advised patient I would provide update to Dr. Dallie. Our office will return call if any additional recommendations. ER precautions provided for new/worsening symptoms or heavy bleeding. Patient agreeable and appreciative of call to pharmacy.   Routing to provider for final review. Patient is agreeable to disposition. Will close encounter.

## 2024-01-24 NOTE — Telephone Encounter (Signed)
 Spoke with patient, advised as seen below per Dr. Dallie. Patient states she has already been on medication for 3 months, last seen for OV 01/06/24. Reports no new symptoms since we last spoke, changing pad q2-3 hrs. No changes so far.   Dr. Dallie -Do you recommend monitoring an additional 1-3 months?

## 2024-01-26 NOTE — Telephone Encounter (Signed)
 Megace  once daily or twice daily? Routing to Dr Dallie

## 2024-01-26 NOTE — Telephone Encounter (Signed)
 Patient notified or recommendations

## 2024-03-01 ENCOUNTER — Ambulatory Visit: Admitting: Internal Medicine

## 2024-06-12 ENCOUNTER — Ambulatory Visit: Admitting: Obstetrics and Gynecology
